# Patient Record
Sex: Male | Born: 1935 | Race: White | Hispanic: No | Smoking: Former smoker
Health system: Southern US, Community
[De-identification: ages and names within clinical notes are randomized; demographics above are authoritative.]

## PROBLEM LIST (undated history)

## (undated) DIAGNOSIS — H9190 Unspecified hearing loss, unspecified ear: Secondary | ICD-10-CM

## (undated) DIAGNOSIS — IMO0002 Reserved for concepts with insufficient information to code with codable children: Secondary | ICD-10-CM

## (undated) DIAGNOSIS — R413 Other amnesia: Secondary | ICD-10-CM

## (undated) DIAGNOSIS — K219 Gastro-esophageal reflux disease without esophagitis: Secondary | ICD-10-CM

## (undated) DIAGNOSIS — K5792 Diverticulitis of intestine, part unspecified, without perforation or abscess without bleeding: Secondary | ICD-10-CM

## (undated) DIAGNOSIS — E785 Hyperlipidemia, unspecified: Secondary | ICD-10-CM

## (undated) DIAGNOSIS — M1990 Unspecified osteoarthritis, unspecified site: Secondary | ICD-10-CM

## (undated) DIAGNOSIS — F429 Obsessive-compulsive disorder, unspecified: Secondary | ICD-10-CM

## (undated) DIAGNOSIS — R1310 Dysphagia, unspecified: Secondary | ICD-10-CM

## (undated) DIAGNOSIS — I10 Essential (primary) hypertension: Secondary | ICD-10-CM

## (undated) DIAGNOSIS — R569 Unspecified convulsions: Secondary | ICD-10-CM

## (undated) DIAGNOSIS — F419 Anxiety disorder, unspecified: Secondary | ICD-10-CM

## (undated) DIAGNOSIS — F1910 Other psychoactive substance abuse, uncomplicated: Secondary | ICD-10-CM

## (undated) DIAGNOSIS — G4762 Sleep related leg cramps: Secondary | ICD-10-CM

## (undated) DIAGNOSIS — T7840XA Allergy, unspecified, initial encounter: Secondary | ICD-10-CM

## (undated) DIAGNOSIS — H269 Unspecified cataract: Secondary | ICD-10-CM

## (undated) DIAGNOSIS — I447 Left bundle-branch block, unspecified: Secondary | ICD-10-CM

## (undated) HISTORY — DX: Essential (primary) hypertension: I10

## (undated) HISTORY — DX: Obsessive-compulsive disorder, unspecified: F42.9

## (undated) HISTORY — DX: Anxiety disorder, unspecified: F41.9

## (undated) HISTORY — DX: Unspecified cataract: H26.9

## (undated) HISTORY — DX: Dysphagia, unspecified: R13.10

## (undated) HISTORY — DX: Unspecified convulsions: R56.9

## (undated) HISTORY — DX: Gastro-esophageal reflux disease without esophagitis: K21.9

## (undated) HISTORY — DX: Unspecified hearing loss, unspecified ear: H91.90

## (undated) HISTORY — PX: BASAL CELL CARCINOMA EXCISION: SHX1214

## (undated) HISTORY — DX: Sleep related leg cramps: G47.62

## (undated) HISTORY — PX: TONSILLECTOMY: SUR1361

## (undated) HISTORY — DX: Reserved for concepts with insufficient information to code with codable children: IMO0002

## (undated) HISTORY — DX: Diverticulitis of intestine, part unspecified, without perforation or abscess without bleeding: K57.92

## (undated) HISTORY — DX: Other amnesia: R41.3

## (undated) HISTORY — DX: Unspecified osteoarthritis, unspecified site: M19.90

## (undated) HISTORY — PX: EYE SURGERY: SHX253

## (undated) HISTORY — DX: Other psychoactive substance abuse, uncomplicated: F19.10

## (undated) HISTORY — PX: TURP VAPORIZATION: SUR1397

## (undated) HISTORY — PX: CATARACT EXTRACTION: SUR2

## (undated) HISTORY — PX: PROSTATE SURGERY: SHX751

## (undated) HISTORY — DX: Hyperlipidemia, unspecified: E78.5

## (undated) HISTORY — DX: Allergy, unspecified, initial encounter: T78.40XA

## (undated) HISTORY — DX: Left bundle-branch block, unspecified: I44.7

---

## 2001-08-24 ENCOUNTER — Encounter: Payer: Self-pay | Admitting: Internal Medicine

## 2001-08-24 ENCOUNTER — Encounter: Admission: RE | Admit: 2001-08-24 | Discharge: 2001-08-24 | Payer: Self-pay | Admitting: Internal Medicine

## 2002-01-09 ENCOUNTER — Ambulatory Visit (HOSPITAL_COMMUNITY): Admission: RE | Admit: 2002-01-09 | Discharge: 2002-01-09 | Payer: Self-pay | Admitting: Cardiology

## 2004-08-25 ENCOUNTER — Inpatient Hospital Stay (HOSPITAL_COMMUNITY): Admission: RE | Admit: 2004-08-25 | Discharge: 2004-08-27 | Payer: Self-pay | Admitting: Urology

## 2004-08-25 ENCOUNTER — Encounter (INDEPENDENT_AMBULATORY_CARE_PROVIDER_SITE_OTHER): Payer: Self-pay | Admitting: *Deleted

## 2009-04-09 ENCOUNTER — Inpatient Hospital Stay (HOSPITAL_COMMUNITY): Admission: EM | Admit: 2009-04-09 | Discharge: 2009-04-10 | Payer: Self-pay | Admitting: Emergency Medicine

## 2010-04-07 ENCOUNTER — Encounter: Admission: RE | Admit: 2010-04-07 | Discharge: 2010-04-07 | Payer: Self-pay | Admitting: Internal Medicine

## 2010-08-19 ENCOUNTER — Emergency Department (HOSPITAL_COMMUNITY): Admission: EM | Admit: 2010-08-19 | Discharge: 2010-08-19 | Payer: Self-pay | Admitting: Emergency Medicine

## 2010-12-22 LAB — POCT CARDIAC MARKERS
CKMB, poc: <1 ng/mL — ABNORMAL LOW (ref 1.0–8.0)
Myoglobin, poc: 122 ng/mL (ref 12–200)
Troponin i, poc: <0.05 ng/mL (ref 0.00–0.09)

## 2010-12-22 LAB — POCT I-STAT, CHEM 8
BUN: 5 mg/dL — ABNORMAL LOW (ref 6–23)
Calcium, Ion: 1.13 mmol/L (ref 1.12–1.32)
Chloride: 100 mEq/L (ref 96–112)
Creatinine, Ser: 1.1 mg/dL (ref 0.4–1.5)
Glucose, Bld: 97 mg/dL (ref 70–99)
HCT: 43 % (ref 39.0–52.0)
Hemoglobin: 14.6 g/dL (ref 13.0–17.0)
Potassium: 4.3 mEq/L (ref 3.5–5.1)
Sodium: 137 mEq/L (ref 135–145)
TCO2: 28 mmol/L (ref 0–100)

## 2011-01-01 ENCOUNTER — Other Ambulatory Visit: Payer: Self-pay | Admitting: Family Medicine

## 2011-01-18 LAB — URINE CULTURE
Colony Count: NO GROWTH
Culture: NO GROWTH

## 2011-01-18 LAB — POCT CARDIAC MARKERS
CKMB, poc: <1 ng/mL — ABNORMAL LOW (ref 1.0–8.0)
Myoglobin, poc: 87.2 ng/mL (ref 12–200)

## 2011-01-18 LAB — CBC
MCV: 101.5 fL — ABNORMAL HIGH (ref 78.0–100.0)
Platelets: 223 10*3/uL (ref 150–400)
RDW: 13.5 % (ref 11.5–15.5)
WBC: 6.4 10*3/uL (ref 4.0–10.5)

## 2011-01-18 LAB — URINALYSIS, ROUTINE W REFLEX MICROSCOPIC
Glucose, UA: NEGATIVE mg/dL
Hgb urine dipstick: NEGATIVE
Specific Gravity, Urine: 1.011 (ref 1.005–1.030)
pH: 8 (ref 5.0–8.0)

## 2011-01-18 LAB — COMPREHENSIVE METABOLIC PANEL
AST: 31 U/L (ref 0–37)
Albumin: 3.7 g/dL (ref 3.5–5.2)
BUN: 8 mg/dL (ref 6–23)
Chloride: 106 mEq/L (ref 96–112)
Creatinine, Ser: 1.04 mg/dL (ref 0.4–1.5)
GFR calc Af Amer: >60 mL/min (ref >60)
Potassium: 3.9 mEq/L (ref 3.5–5.1)
Total Bilirubin: 0.6 mg/dL (ref 0.3–1.2)
Total Protein: 6.8 g/dL (ref 6.0–8.3)

## 2011-01-18 LAB — POCT I-STAT, CHEM 8
BUN: 9 mg/dL (ref 6–23)
Creatinine, Ser: 1.3 mg/dL (ref 0.4–1.5)
Potassium: 3.9 mEq/L (ref 3.5–5.1)
Sodium: 139 mEq/L (ref 135–145)

## 2011-01-18 LAB — DIFFERENTIAL
Basophils Absolute: 0.1 10*3/uL (ref 0.0–0.1)
Eosinophils Relative: 6 % — ABNORMAL HIGH (ref 0–5)
Lymphocytes Relative: 42 % (ref 12–46)
Monocytes Absolute: 0.6 10*3/uL (ref 0.1–1.0)
Monocytes Relative: 9 % (ref 3–12)
Neutro Abs: 2.7 10*3/uL (ref 1.7–7.7)

## 2011-02-01 ENCOUNTER — Other Ambulatory Visit: Payer: Self-pay | Admitting: Dermatology

## 2011-02-23 NOTE — Procedures (Signed)
EEG NUMBER:  [id number].   CLINICAL HISTORY:  An 77 year old man with memory problems, who awoke  this morning with jerking movements. EEG is for evaluation.  The  patient is described as awake and alert.  This is a portable EEG done in  the emergency department with photic stimulation, but without  hyperventilation.   DESCRIPTION:  The dominant rhythm is tracing, is a very well-modulated  medium amplitude alpha rhythm of 9-10 Hz, which predominates  posteriorly, appears without abnormal asymmetry, and attenuates with eye  opening and closing.  Low amplitude fast activity is seen frontally and  centrally and appears without abnormal asymmetry.  No focal slowing is  noted and no epileptiform discharges are seen.  The patient remained in  the awake state throughout the recording.  Photic stimulation produced  symmetric driver responses.  Hyperventilation was not performed.  Single  channel devoted EKG revealed sinus rhythm throughout with a rate of  approximately 60 beats minute.   CONCLUSION:  Normal study in awake state.      Michael L. Thad Ranger, M.D.  Electronically Signed     ZOX:WRUE  D:  04/09/2009 20:15:56  T:  04/10/2009 07:57:36  Job #:  [id number]

## 2011-02-23 NOTE — Consult Note (Signed)
Daniel [name], SABER                ACCOUNT NO.:  [id number]   MEDICAL RECORD NO.:  [id number]          PATIENT TYPE:  INP   LOCATION:  4710                         FACILITY:  [facility]   PHYSICIAN:  Casimiro Needle L. Reynolds, M.D.DATE OF BIRTH:  07/27/1921   DATE OF CONSULTATION:  04/09/2009  DATE OF DISCHARGE:                                 CONSULTATION   REQUESTING PHYSICIAN:  Hollice Espy, MD.   REASON FOR EVALUATION:  Jerking.   HISTORY OF PRESENT ILLNESS:  This is the initial inpatient consultation  and evaluation of this 77 year old man with a past medical history,  which includes dementia, on Aricept and Namenda.  He says he was in his  usual state of health recently.  This morning, he recalls waking up, as  if being a little bit agitated in a dream.  Immediately found awaken, or  perhaps shortly afterwards, he said he has a jerking of his head,  which was up and down.  It was not really painful, but he could not  control his head from going up and down.  It then moved to his stomach  and he says that he felt he was pulled forward with his legs being  pulled out.  After a lot of questioning, it is still not clear to me  whether this was twitching or jerking or whether it was more of a tonic  movement, which was intermittently relaxing sometimes.  He indicates, he  says it was particularly painful, but he could not control it.  He  called for his wife, who saw it.  He then settled down a little bit, and  he was well enough to jump out of that and run down the hall a few  minutes later.  Nevertheless, he was brought to the emergency  department.  The family at some point concerned about seizure, was  raised and he was admitted.  He had a CT of the head, which is further  discussed below.  He says he has never had any symptoms like this  before, but has had equal cramps easily on his arms and legs.  Again,  he was wide awake and alert during the event, able to call out to his  wife.   PAST MEDICAL HISTORY:  History of dementia as above.  History of  allergies.  Several other medical problems including hypertension,  hyperlipidemia, BPH, diverticulosis, gastroesophageal reflux disease,  PSVT, and precancerous skin lesions.   FAMILY HISTORY/SOCIAL HISTORY/REVIEW OF SYSTEMS:  Per admission H and P.   MEDICATIONS:  Prior to admission, he was taking  1. Aricept 5 mg daily.  2. Namenda 10 mg b.i.d.  3. Sanctura.  4. Pataday.  5. Nasonex.  6. Omeprazole.   PHYSICAL EXAMINATION:  VITAL SIGNS:  Temperature 97.4, blood pressure  113/56, pulse 58, respirations 20, and O2 sat 97% on room air.  GENERAL:  This is a healthy-appearing man, supine in the hospital bed,  in no distress.  HEENT:  Head, cranium is normocephalic and atraumatic.  Oropharynx is  benign.  NECK:  Supple without carotid or supraclavicular  bruits.  HEART:  Regular rate and rhythm without murmurs.  NEUROLOGIC:  Mental status, he is awake and alert.  He is oriented to  time and place.  Recent memory is mildly impaired, but remote is intact.  He is able to name objects, repeat phrase and follow commands without  difficulty.  Cranial nerves; pupils equal and briskly reactive.  Extraocular movements are full without nystagmus.  Visual fields full to  confrontation.  Hearing is intact to conversational speech.  Face,  tongue, and palate move normally and symmetrically.  Motor; normal bulk  and tone.  Normal strength in all tested extremity muscles.  No tremors  noted and no adventitious movements in the muscles of the trunk or  extremities.  Sensation is intact to pinprick and light touch  throughout.  He seems to have some hyperpathia over the feet.  Reflexes  diminished in lower extremities.  Toes are downgoing bilaterally.  Cerebellar, rapid movements performed accurately.  Finger-to-nose  performed accurately.  Gait; he rises easily from a chair.  His gait is  stable, slightly wide based, but he  can walk well, can easily toe walk,  sway slightly, but does not follow the Romberg maneuver.   LABORATORY REVIEW:  CMET on admission normal except for glucose of 105.  CBC on admission normal.  Cardiac enzymes negative, but unfortunate no  total CKs was done.  Urinalysis negative.  CT of the head was personally  reviewed.  The study is read out as showing prominent ventricles, but  from my review there is evident atrophy and any ventricular enlargement,  which is only minimally present, is not out of proportion to atrophy.  In my opinion, no evidence of hydrocephalus on the scan.  There was also  no acute finding.   IMPRESSION:  1. Involuntary jerking.  This was not a seizure as he was awake and      alert during the episode.  It is unclear what this represents, but      he says that he cramps easily.  It is possible this is side effect      of medication, although it is unclear exactly what.  2. Abnormal CT.  As above, I reviewed the films and feel that they      demonstrated only mild atrophy for age, with ventricular      enlargement not out of proportion to atrophy.  3. Question peripheral neuropathy.  He complains of cold feet and have      some hyperpathia with just some mild tenderness.   RECOMMENDATIONS:  We will continue medications, although if symptoms  recur, consider decreasing Aricept.  It will be wise to decrease the  patient's alcohol intake, which apparently is considerable.  Consider  outpatient NCV for his feet and if negative, lower extremity vascular  studies.  I would not start him on anticonvulsants.   Thank you for this consultation.      Michael L. Thad Ranger, M.D.  Electronically Signed     MLR/MEDQ  D:  04/09/2009  T:  04/10/2009  Job:  [id number]   cc:   Georgann Housekeeper, MD

## 2011-02-23 NOTE — Discharge Summary (Signed)
Daniel [name], Daniel [name]                ACCOUNT NO.:  [id number]   MEDICAL RECORD NO.:  [id number]          PATIENT TYPE:  INP   LOCATION:  4710                         FACILITY:  [facility]   PHYSICIAN:  Georgann Housekeeper, MD      DATE OF BIRTH:  [birth year]   DATE OF ADMISSION:  04/09/2009  DATE OF DISCHARGE:  04/10/2009                               DISCHARGE SUMMARY   DISCHARGE DIAGNOSES:  Possible questionable seizure with muscle cramps.  Neurology evaluation negative.  Abnormal CT imaging reviewed by  Neurology, did not think normal pressure hydrocephalus, medication  effect Sanctura, history of alcohol use, history of dementia, history of  hypertension, and history of urinary incontinence.   MEDICATION ON DISCHARGE:  1. Vitamin C 1 a day.  2. Aspirin 81 mg daily.  3. Multivitamin 1 daily.  4. Aricept 5 mg daily.  5. Omeprazole 20 mg daily.  6. Namenda 10 mg b.i.d.  7. Nasal spray p.r.n.  8. MiraLax 17 g p.r.n.   Discontinue Sanctura.   As far as hospital laboratory, CT scan negative.   LABORATORY DATA:  Urine, blood chemistries, blood counts normal.  Chest  x-ray negative.   HOSPITAL COURSE:  An 77 year old male who had an episode of questionable  confusion and also some muscle cramp, felt like jerking sensation.  The  patient was brought into the emergency room.  He had negative CT,  although the radiologist said it could be mildly enlarged ventricles for  his size.  The patient was evaluated by Neurology for possible seizure,  did not think there was any seizure activity nor the ventricle looked  normal for his age.  No evidence of any hydrocephalus.  The patient's  blood work remained all negative.  It was thought to be related to the  possible alcohol plus Sanctura.  No clear evidence of withdrawal.  The  patient was alert, oriented without any neurological deficits.  He was  monitored on telemetry with normal sinus rhythm and UA was negative.  As  far as his blood pressure,  it remained stable.  The patient will be  discharged.  His Philis Nettle will discontinue and encourage him to not to  drink alcohol and I will follow him in 3-4 weeks.  Possible nerve  conduction test as outpatient if indicated.      Georgann Housekeeper, MD  Electronically Signed     KH/MEDQ  D:  04/10/2009  T:  04/10/2009  Job:  [id number]

## 2011-02-23 NOTE — H&P (Signed)
NAMENEEL, BUFFONE                ACCOUNT NO.:  [id number]   MEDICAL RECORD NO.:  [id number]          PATIENT TYPE:  INP   LOCATION:  4710                         FACILITY:  [facility]   PHYSICIAN:  Hollice Espy, M.D.DATE OF BIRTH:  [birth year]   DATE OF ADMISSION:  04/09/2009  DATE OF DISCHARGE:                              HISTORY & PHYSICAL   CHIEF COMPLAINT:  Seizure.   HISTORY OF PRESENT ILLNESS:  Mr. Crumpacker is an 77 year-old male who awoke  today at 4 a.m. with rhythmic convulsions.  He said, my head was in  turmoil.  These compulsions were started in his head and neck and then  extended into his torso and extremities.  The daughter who lives next  door came to evaluate the patient.  She witnessed approximately 15  minutes of this activity, which he described as whole body rhythmic  jerking.  There was no loss of consciousness.  No bowel or bladder  incontinence, but she noted the patient was confused after the event.   ALLERGIES:  BIAXIN causes rash.  PENICILLIN G causes rash.  Also  multiple additional allergies including PEPPER, MUSTARD, and CINNAMON.   PAST MEDICAL HISTORY:  1. Hypercholesterolemia.  2. BPH.  3. Diverticulosis.  4. GERD.  5. Allergic rhinitis.  6. Paroxysmal SVT.  7. Dementia.  8. Hypertension.  9. Constipation.  10.Precancerous lesions, followed by Dermatology.   PAST SURGICAL HISTORY:  1. TURP.  2. Tonsillectomy.   SOCIAL HISTORY:  The patient is married.  He has 1 adopted grandson and  2 daughters.  He smoked from age 27 until 22.  He does drink alcohol.  The daughter reports currently drinking a fifth of alcohol.  She does  note that he drank a bottle's wine on father's day and that he sometimes  will have his vodka with breakfast.  He is a retired Airline pilot.   FAMILY HISTORY:  His mother is deceased at age 48 secondary to MI and  his dad is deceased at 61 with dementia.  He has 2 sisters who are  deceased secondary to MI and a brother  who is deceased secondary to MI.  He has a daughter who suffered an MI, however, she is living.   MEDICATIONS:  1. Vitamin C 500 mg p.o. daily.  2. Aspirin 81 mg p.o. daily.  3. Multivitamin 1 tab p.o. daily.  4. Aricept 5 mg p.o. nightly.  5. Omeprazole 20 mg p.o. daily.  6. Namenda 10 mg p.o. b.i.d.  7. Nasonex 50 mcg 2 puffs in each nostril daily.  8. Patanol 0.1% one drop into affected I twice daily.  9. MiraLax 17 g in 8 ounces of water.  10.Sanctura 60 mg p.o. daily.   REVIEW OF SYSTEMS:  GENERAL:  No fever, no chills.  ENT: Chronic nasal  congestion.  CARDIOVASCULAR:  Feet are always cold.  Denies chest pain  today.  RESPIRATORY:  No cough.  No chest congestion.  GI:  No nausea,  vomiting, or diarrhea.  PSYCHIATRIC:  Daughter reports positive anxiety  and he gets angry at times, sometimes punching  walls.  NEURO:  Hard-of-  hearing.  Please see HPI for additional neuro history.  The family also  reports memory decline in the last 6 months, especially short term  memory loss.  The patient complains of chronic cold feet.  MUSCULOSKELETAL:  Chronic joint pain, limp.  Denies lymphadenopathy.  SKIN:  Chronic eczema.   LABORATORY DATA/RADIOLOGY:  White blood cell count 6.4, hemoglobin 14.7,  hematocrit 44.1, and platelets 223.  BUN 8, creatinine 1.04, sodium 140,  potassium 3.9, AST 31, and ALT 22.  Myoglobin 87.2, MB less than 1,  troponin less than 0.05, INR 1.0.  Urinalysis is negative.  Culture is  pending.  Chest x-ray shows no active disease.  CT of head notes  prominent ventricles, questionably in proportion to atrophy, but could  not exclude normal pressure hydrocephalus.   PHYSICAL EXAMINATION:  VITAL SIGNS:  BP 122/68, heart rate 58,  respiratory rate 16, temperature 97, and O2 sat 99% on room air.  GENERAL:  Elderly white male in no acute distress.  NECK:  Supple.  No masses.  No thyroid enlargement.  ENT:  Ears with positive cerumen bilaterally.  TMs intact.   Positive  cone of light.  CARDIOVASCULAR:  S1 and S2.  Regular rate and rhythm.  No lower  extremity edema.  2+ dorsalis pedis and posterior tibial pulses  bilaterally.  RESPIRATORY:  Breath sounds are clear to auscultation bilaterally  without wheezes, rales, or rhonchi.  No increased work of breathing.  ABDOMEN:  Soft, nontender, and nondistended.  Positive bowel sounds.  NEURO:  Moving all extremities.  His speech is somewhat rambling.  Positive facial symmetry is noted.  Strong equal bilateral hand grabs.  The patient is hard-of-hearing.  SKIN:  Dry.  He has a scar in his left shoulder from former skin cancer  removal.   ASSESSMENT/PLAN:  1. Rule out seizure:  We will check an EEG, place the patient on      seizure precautions and alcohol withdrawal seizure is a      possibility, however, the patient's family cannot clearly define      his recent alcohol use.  We will add Ativan p.r.n. for withdrawal      or seizure.  We will also ask for Neurology consult and defer      further workup to their team in regards to abnormal findings on CT,      questioning normal pressure hydrocephalus.  2. Dementia:  This is progressive per the family especially in the      last 6 months.  We will hold Aricept and Namenda secondary to      seizure side effect.  This was reviewed in the [facility].  3. Hypercholesterolemia:  The patient refuses to take statin secondary      to I believe that this was making his memory worse.  4. Hypertension:  BP is stable.  No longer taking atenolol.  We will      monitor.  5. Urinary continence:  He is under a lot of extensive workup with      Urology which was negative.  Plan to continue Sanctura.  6. Diverticulosis, stable.  7. Gastroesophageal reflux disease:  Continue omeprazole.  8. Allergic rhinitis:  Stable.  Continue Patanol and Nasonex, and      outpatient followup with Dr. Stevphen Rochester.  9. History of supraventricular tachycardia:  The patient is  currently      with stable heart rate today.  I reviewed the EKG.  The patient is      currently in atrial fibrillation.  It is unclear whether or not he      has a history of atrial fibrillation or whether this is a new      finding.  I doubt that if he will be a Coumadin candidate secondary      to seizure and fall risk.  We will continue aspirin for now and      monitor the patient on telemetry.  10.History of skin cancer:  Outpatient followup with Dermatology.      Sandford Craze, NP      Hollice Espy, M.D.  Electronically Signed    MO/MEDQ  D:  04/09/2009  T:  04/09/2009  Job:  [id number]   cc:   Georgann Housekeeper, MD  Excell Seltzer. Annabell Howells, M.D.

## 2011-02-26 NOTE — Op Note (Signed)
Daniel [name], Daniel [name]                ACCOUNT NO.:  [id number]   MEDICAL RECORD NO.:  [id number]          PATIENT TYPE:  INP   LOCATION:  0005                         [facility]ACILITY[facility]  [facility]   PHYSICIAN:  Jamison Neighbor, M.D.  DATE OF BIRTH:  1921-06-20   DATE OF PROCEDURE:  08/25/2004  DATE OF DISCHARGE:                                 OPERATIVE REPORT   PREOPERATIVE DIAGNOSIS:  Benign prostatic hypertrophy with bladder outlet  obstruction.   POSTOPERATIVE DIAGNOSIS:  Benign prostatic hypertrophy with bladder outlet  obstruction.   PROCEDURE:  Cystoscopy and transurethral resection of the prostate.   SURGEON:  Jamison Neighbor, M.D.   ANESTHESIA:  General.   COMPLICATIONS:  None.   DRAINS:  A 24 French three-way Foley catheter.   HISTORY:  This 77 year old male has had problems with bladder outlet  obstruction.  He has had urgency, frequency, and nocturia up to three times  nightly.  His urinary stream is diminished.  Flomax has helped somewhat, but  the patient has a chronic case of nasal congestion that made it difficult to  take that medication.  He was offered the possibility of using a different  alpha blocker.  The patient was also given the opportunity to consider a  combination therapy with 5-alpha reductase inhibitors.  He has been placed  on an anticholinergic to cut down on some of his nocturia without that much  improvement.  He had requested other alternative treatments.  We discussed  in-office treatments such as TUNA or microwave as well as TURP.  He elected  to have the definitive procedure, namely the TURP.  He understands the risks  and benefits of the procedure.  He realizes he will have a hospital stay of  approximately two days.  He is also aware of the fact that he will have  hematuria, urgency, and frequency postoperatively, and the full affect of  the procedure on his bladder outlet will not be readily apparent for several  weeks or months.  Patient  understands the possible need for anticholinergic  therapy and realizes that the principle reason for this procedure is to  improve his ability to empty his bladder to completion.  He gave full,  informed consent.   PROCEDURE:  After successful induction of general anesthesia, the patient  was placed in the dorsal lithotomy position, prepped with Betadine, and  draped in the usual sterile fashion.  The urethra was calibrated to a 30  [place] with [employer].  The Olympus continuous resectoscope sheath  was then inserted, using a Timberlake obturator.  The __________  resectoscope was inserted with a 12 degree lens.  The bladder was carefully  inspected.  It was free of any tumors or stones. Both ureteral orifices were  normal in configuration and location and appropriately back from the bladder  neck.  There was no significant intravesical adenoma.  The patient had a  median level as well as classic lateral lobe hypertrophy.  The median level  was then resected, beginning at the bladder neck, extending out to the  verumontanum.  The right  lateral lobe was resected, starting at the 11  o'clock position and extending down to the floor of the prostate.  This went  from the bladder neck out to the level of the verumontanum.  The left  lateral lobe was resected in an identical fashion.  There was very little in  the way of anterior lobe tissue.  There was additional tissue on the floor  and apex that was resected.  Rectal examination at the end showed that most,  if not all of the residual tissue had been removed, and all that was really  left was a little bit out beyond the verumontanum, which was left in place  to avoid injury to the sphincter.  Careful visualization examination showed  that the bladder neck was opened but not undermined.  The ureters had not  been injured.  All chips had been irrigated from the bladder.  Adequate  hemostasis had been obtained.  There was a wide-open  prostatic fossa with an  intact sphincter and verumontanum.  The final inspection showed all chips  had been removed, and adequate hemostasis had been obtained.  The  resectoscope was removed.  A Foley catheter was inserted and drained  clearly.  The chips were all sent for permanent section.  The catheter was  placed to straight drainage, and continuous bladder irrigation was initiated  with normal saline.      RJE/MEDQ  D:  08/25/2004  T:  08/25/2004  Job:  [id number]   cc:   Georgann Housekeeper, MD  [address]  [place]  [place] [address]  Fax: [phone]   Armanda Magic, M.D.  [address]  [place], [place] [address]  Fax: [phone]

## 2011-02-26 NOTE — H&P (Signed)
[name], [name] NO.:  [id number]   MEDICAL RECORD NO.:  [id number]          PATIENT TYPE:  INP   LOCATION:  0005                         FACILITY:  [facility]   PHYSICIAN:  Jamison Neighbor, M.D.  DATE OF BIRTH:  09/02/1921   DATE OF ADMISSION:  08/25/2004  DATE OF DISCHARGE:                                HISTORY & PHYSICAL   HISTORY:  This 77 year old male has problems with BPH and bladder outlet  obstruction.  The patient has not responded as well as he would like to a  combination of alpha blockade and anticholinergic therapy.  Alpha blockers  have caused him serious problems with nasal congestion, and he does have  chronic allergies.  He is now to undergo TURP and will be admitted following  that procedure.   Patient's past medical history is quite unremarkable.  He has generally been  in very good health.  He is noted to have somewhat elevated lipids, and  occasionally has some irregular palpitations of the heart and tachycardia,  for which he takes atenolol.  This has gone on for approximately 30 years.  Dr. Donette [name] describes this as being supraventricular tachycardia.  A stress  test in 2000 was negative.  The patient's other medical problems primarily  involve his allergies, which he takes allergy shots and Nasonex as well as  Restasis eye drops and over-the-counter artificial tears.  As noted above,  he has used Enablex as well as Flomax to try and work on his problems with  his prostate.  He is also on Lipitor and Metamucil every day.  His Afrin was  discontinued.   Patient's previous surgeries are tonsillectomy as well as right cataract  surgery.   His review of systems is pertinent for the cardiac arrhythmia as well as the  allergy problems and his voiding problems.  He has mild problems with  arthritis and pain in the left knee as well as in the hips and ankles.  He  otherwise has an unremarkable review of systems.   SOCIAL HISTORY:   Unremarkable.  He does not use tobacco in over 40 years.  He drinks alcohol very infrequently.   PHYSICAL EXAMINATION:  HEENT:  Normocephalic and atraumatic.  NECK:  Supple with no adenopathy or thyromegaly.  LUNGS:  Clear.  HEART:  Regular rate and rhythm with no murmurs, thrills, gallops, rubs, or  heaves.  ABDOMEN:  Soft and nontender with no palpable masses, rebound or guarding.  Patient had no flank mass or tenderness.  GU:  Normal.  Testicles were unremarkable in size, shape, and consistency.  There was no hydrocele, spermatocele, varicocele or hernia.  No adenopathy.  RECTAL:  A 3+ benign-filling prostate.  EXTREMITIES:  No clubbing, cyanosis or edema.  NEUROLOGIC:  Cranial nerves II-XII were grossly intact.  VASCULAR:  Intact.   IMPRESSION:  Benign prostatic hypertrophy with bladder outlet obstruction.   SECONDARY DIAGNOSES:  1.  Arrhythmia.  2.  Chronic allergies.   PLAN:  Admit following TURP.      RJE/MEDQ  D:  08/25/2004  T:  08/25/2004  Job:  [id number] 

## 2011-11-03 DIAGNOSIS — J309 Allergic rhinitis, unspecified: Secondary | ICD-10-CM | POA: Diagnosis not present

## 2011-11-10 DIAGNOSIS — J309 Allergic rhinitis, unspecified: Secondary | ICD-10-CM | POA: Diagnosis not present

## 2011-11-17 DIAGNOSIS — J309 Allergic rhinitis, unspecified: Secondary | ICD-10-CM | POA: Diagnosis not present

## 2011-11-24 DIAGNOSIS — J309 Allergic rhinitis, unspecified: Secondary | ICD-10-CM | POA: Diagnosis not present

## 2011-12-01 DIAGNOSIS — J309 Allergic rhinitis, unspecified: Secondary | ICD-10-CM | POA: Diagnosis not present

## 2011-12-10 DIAGNOSIS — J309 Allergic rhinitis, unspecified: Secondary | ICD-10-CM | POA: Diagnosis not present

## 2011-12-16 DIAGNOSIS — J309 Allergic rhinitis, unspecified: Secondary | ICD-10-CM | POA: Diagnosis not present

## 2011-12-23 DIAGNOSIS — J309 Allergic rhinitis, unspecified: Secondary | ICD-10-CM | POA: Diagnosis not present

## 2011-12-29 DIAGNOSIS — J309 Allergic rhinitis, unspecified: Secondary | ICD-10-CM | POA: Diagnosis not present

## 2012-01-04 DIAGNOSIS — H1045 Other chronic allergic conjunctivitis: Secondary | ICD-10-CM | POA: Diagnosis not present

## 2012-01-04 DIAGNOSIS — J3089 Other allergic rhinitis: Secondary | ICD-10-CM | POA: Diagnosis not present

## 2012-01-04 DIAGNOSIS — J309 Allergic rhinitis, unspecified: Secondary | ICD-10-CM | POA: Diagnosis not present

## 2012-01-04 DIAGNOSIS — J301 Allergic rhinitis due to pollen: Secondary | ICD-10-CM | POA: Diagnosis not present

## 2012-01-18 DIAGNOSIS — J309 Allergic rhinitis, unspecified: Secondary | ICD-10-CM | POA: Diagnosis not present

## 2012-01-20 DIAGNOSIS — R3915 Urgency of urination: Secondary | ICD-10-CM | POA: Diagnosis not present

## 2012-01-20 DIAGNOSIS — N3941 Urge incontinence: Secondary | ICD-10-CM | POA: Diagnosis not present

## 2012-01-27 DIAGNOSIS — J309 Allergic rhinitis, unspecified: Secondary | ICD-10-CM | POA: Diagnosis not present

## 2012-02-02 DIAGNOSIS — J309 Allergic rhinitis, unspecified: Secondary | ICD-10-CM | POA: Diagnosis not present

## 2012-02-14 DIAGNOSIS — J309 Allergic rhinitis, unspecified: Secondary | ICD-10-CM | POA: Diagnosis not present

## 2012-02-23 DIAGNOSIS — J309 Allergic rhinitis, unspecified: Secondary | ICD-10-CM | POA: Diagnosis not present

## 2012-03-01 DIAGNOSIS — L821 Other seborrheic keratosis: Secondary | ICD-10-CM | POA: Diagnosis not present

## 2012-03-01 DIAGNOSIS — L570 Actinic keratosis: Secondary | ICD-10-CM | POA: Diagnosis not present

## 2012-03-01 DIAGNOSIS — J309 Allergic rhinitis, unspecified: Secondary | ICD-10-CM | POA: Diagnosis not present

## 2012-03-09 DIAGNOSIS — H353190 Nonexudative age-related macular degeneration, unspecified eye, stage unspecified: Secondary | ICD-10-CM | POA: Diagnosis not present

## 2012-03-09 DIAGNOSIS — J309 Allergic rhinitis, unspecified: Secondary | ICD-10-CM | POA: Diagnosis not present

## 2012-03-16 DIAGNOSIS — J309 Allergic rhinitis, unspecified: Secondary | ICD-10-CM | POA: Diagnosis not present

## 2012-03-22 DIAGNOSIS — J309 Allergic rhinitis, unspecified: Secondary | ICD-10-CM | POA: Diagnosis not present

## 2012-04-03 DIAGNOSIS — G309 Alzheimer's disease, unspecified: Secondary | ICD-10-CM | POA: Diagnosis not present

## 2012-04-03 DIAGNOSIS — F0280 Dementia in other diseases classified elsewhere without behavioral disturbance: Secondary | ICD-10-CM | POA: Diagnosis not present

## 2012-04-04 DIAGNOSIS — J309 Allergic rhinitis, unspecified: Secondary | ICD-10-CM | POA: Diagnosis not present

## 2012-04-05 DIAGNOSIS — J309 Allergic rhinitis, unspecified: Secondary | ICD-10-CM | POA: Diagnosis not present

## 2012-04-17 DIAGNOSIS — J309 Allergic rhinitis, unspecified: Secondary | ICD-10-CM | POA: Diagnosis not present

## 2012-04-18 ENCOUNTER — Ambulatory Visit (INDEPENDENT_AMBULATORY_CARE_PROVIDER_SITE_OTHER): Payer: Medicare Other | Admitting: Emergency Medicine

## 2012-04-18 ENCOUNTER — Encounter: Payer: Self-pay | Admitting: Emergency Medicine

## 2012-04-18 VITALS — BP 122/62 | HR 69 | Temp 98.1°F | Resp 16 | Ht 67.0 in | Wt 156.8 lb

## 2012-04-18 DIAGNOSIS — R109 Unspecified abdominal pain: Secondary | ICD-10-CM | POA: Diagnosis not present

## 2012-04-18 DIAGNOSIS — F0390 Unspecified dementia without behavioral disturbance: Secondary | ICD-10-CM | POA: Diagnosis not present

## 2012-04-18 DIAGNOSIS — R1084 Generalized abdominal pain: Secondary | ICD-10-CM | POA: Diagnosis not present

## 2012-04-18 DIAGNOSIS — R6881 Early satiety: Secondary | ICD-10-CM | POA: Diagnosis not present

## 2012-04-18 DIAGNOSIS — H6120 Impacted cerumen, unspecified ear: Secondary | ICD-10-CM | POA: Diagnosis not present

## 2012-04-18 LAB — CBC WITH DIFFERENTIAL/PLATELET
Basophils Absolute: 0.1 10*3/uL (ref 0.0–0.1)
Eosinophils Absolute: 0.3 10*3/uL (ref 0.0–0.7)
Lymphs Abs: 2.2 10*3/uL (ref 0.7–4.0)
MCH: 31.4 pg (ref 26.0–34.0)
Neutrophils Relative %: 52 % (ref 43–77)
Platelets: 242 10*3/uL (ref 150–400)
RBC: 4.37 MIL/uL (ref 4.22–5.81)
RDW: 14.6 % (ref 11.5–15.5)
WBC: 7 10*3/uL (ref 4.0–10.5)

## 2012-04-18 LAB — TSH: TSH: 0.987 u[IU]/mL (ref 0.350–4.500)

## 2012-04-18 LAB — COMPREHENSIVE METABOLIC PANEL
ALT: 13 U/L (ref 0–53)
AST: 21 U/L (ref 0–37)
Alkaline Phosphatase: 55 U/L (ref 39–117)
CO2: 28 mEq/L (ref 19–32)
Sodium: 138 mEq/L (ref 135–145)
Total Bilirubin: 0.4 mg/dL (ref 0.3–1.2)
Total Protein: 6.5 g/dL (ref 6.0–8.3)

## 2012-04-18 MED ORDER — OMEPRAZOLE 20 MG PO CPDR: 20.0000 mg | DELAYED_RELEASE_CAPSULE | Freq: Every day | ORAL | Status: DC

## 2012-04-19 NOTE — Progress Notes (Signed)
  Subjective:    Patient ID: Daniel [name], male    DOB: 12/28/1920, 78 y.o.   MRN: [id number]  HPI patient enters for general recheck and also would like removal of wax in his ears.    Review of Systems     Objective:   Physical Exam there is wax impaction involving both ears his chest is clear heart regular rate no murmur        Assessment & Plan:  Wax was removed patient tolerated this well. Patient stable on current medication regimen 2 no treatment necessary.

## 2012-04-21 DIAGNOSIS — J309 Allergic rhinitis, unspecified: Secondary | ICD-10-CM | POA: Diagnosis not present

## 2012-04-25 DIAGNOSIS — J309 Allergic rhinitis, unspecified: Secondary | ICD-10-CM | POA: Diagnosis not present

## 2012-04-26 ENCOUNTER — Ambulatory Visit
Admission: RE | Admit: 2012-04-26 | Discharge: 2012-04-26 | Disposition: A | Discharge status: Home / Self Care | Payer: Medicare Other | Source: Ambulatory Visit | Attending: Emergency Medicine | Admitting: Emergency Medicine

## 2012-04-26 DIAGNOSIS — R109 Unspecified abdominal pain: Secondary | ICD-10-CM

## 2012-04-26 DIAGNOSIS — R6881 Early satiety: Secondary | ICD-10-CM | POA: Diagnosis not present

## 2012-04-27 ENCOUNTER — Encounter: Payer: Self-pay | Admitting: Radiology

## 2012-04-28 DIAGNOSIS — J309 Allergic rhinitis, unspecified: Secondary | ICD-10-CM | POA: Diagnosis not present

## 2012-05-03 DIAGNOSIS — J309 Allergic rhinitis, unspecified: Secondary | ICD-10-CM | POA: Diagnosis not present

## 2012-05-10 DIAGNOSIS — J309 Allergic rhinitis, unspecified: Secondary | ICD-10-CM | POA: Diagnosis not present

## 2012-05-16 DIAGNOSIS — J309 Allergic rhinitis, unspecified: Secondary | ICD-10-CM | POA: Diagnosis not present

## 2012-05-19 DIAGNOSIS — J309 Allergic rhinitis, unspecified: Secondary | ICD-10-CM | POA: Diagnosis not present

## 2012-06-02 DIAGNOSIS — J309 Allergic rhinitis, unspecified: Secondary | ICD-10-CM | POA: Diagnosis not present

## 2012-06-15 DIAGNOSIS — J309 Allergic rhinitis, unspecified: Secondary | ICD-10-CM | POA: Diagnosis not present

## 2012-06-20 DIAGNOSIS — J309 Allergic rhinitis, unspecified: Secondary | ICD-10-CM | POA: Diagnosis not present

## 2012-06-26 DIAGNOSIS — H3530 Unspecified macular degeneration: Secondary | ICD-10-CM | POA: Diagnosis not present

## 2012-07-04 DIAGNOSIS — J309 Allergic rhinitis, unspecified: Secondary | ICD-10-CM | POA: Diagnosis not present

## 2012-07-17 DIAGNOSIS — J309 Allergic rhinitis, unspecified: Secondary | ICD-10-CM | POA: Diagnosis not present

## 2012-08-01 DIAGNOSIS — J309 Allergic rhinitis, unspecified: Secondary | ICD-10-CM | POA: Diagnosis not present

## 2012-08-15 ENCOUNTER — Ambulatory Visit (HOSPITAL_COMMUNITY)
Admission: RE | Admit: 2012-08-15 | Discharge: 2012-08-15 | Disposition: A | Discharge status: Home / Self Care | Payer: Medicare Other | Source: Ambulatory Visit | Attending: Neurology | Admitting: Neurology

## 2012-08-15 DIAGNOSIS — R1314 Dysphagia, pharyngoesophageal phase: Secondary | ICD-10-CM | POA: Insufficient documentation

## 2012-08-15 DIAGNOSIS — J309 Allergic rhinitis, unspecified: Secondary | ICD-10-CM | POA: Diagnosis not present

## 2012-08-15 DIAGNOSIS — R1310 Dysphagia, unspecified: Secondary | ICD-10-CM

## 2012-08-15 DIAGNOSIS — F0280 Dementia in other diseases classified elsewhere without behavioral disturbance: Secondary | ICD-10-CM | POA: Insufficient documentation

## 2012-08-15 DIAGNOSIS — K219 Gastro-esophageal reflux disease without esophagitis: Secondary | ICD-10-CM | POA: Diagnosis not present

## 2012-08-15 DIAGNOSIS — G309 Alzheimer's disease, unspecified: Secondary | ICD-10-CM | POA: Insufficient documentation

## 2012-08-15 NOTE — Procedures (Signed)
Objective Swallowing Evaluation: Modified Barium Swallowing Study  Patient Details  Name: LINFORD QUINTELA MRN: [id number] Date of Birth: 03-Jan-1921  Today's Date: 08/15/2012 Time: 1255-1400 SLP Time Calculation (min): 65 min  Past Medical History: No past medical history on file. Past Surgical History: No past surgical history on file. HPI:  78 yo male referred by Dr Anne Hahn for MBS due to pt complaint of problems coughing with liquids.  Pt has dealt with the loss of his wife in May and his brother in September of this year.  He resides in a condo next to his daughter.  Per interview with daughter, pt has h/o allergies for which he takes shots, reflux for which he takes omeprazole, Alzheimer's dementia, leg cramps.  medication list includes aricept, namenda, nasonex, asa, zoloft.  His daughter states pt has taken omeprazole for several years.  Pt is a poor historian due to his dementia, but is able to state he coughs on drinks when he raises his head.  Coughing does not occur with every meal but can be significant per pt and daughter.  Daughter states pt eats 1/2 of his meals over the last year and take the rest home from restaurant, stating he is full.  She acknowledges pt coughing on drinks at times with subsequent wet voice and hoarseness.  Pt consumes only two meals a day per daughter.  He also walks two miles a day, one mile in am and one in pm.  Daughter denies pt having pnas or weight loss.  Pt talks while he eats and eats a rapid rate per daughter.  He also takes several pills at one time with drink.  Pt denies sensation of food/drink coming back up but does point to throat to indicate area of sticking - points to right side.       Assessment / Plan / Recommendation Clinical Impression  Dysphagia Diagnosis: Mild oral phase dysphagia;Mild pharyngeal phase dysphagia;Mild cervical esophageal phase dysphagia (pt has known h/o GERD and takes omeprazole) Clinical impression: Pt with mild  oropharyngeal and cervical esophageal dysphagia without aspiration or any penetration of any consistency tested.  Please note, pt did overtly cough after first swallow of the study - which happened to be thin liquids.  Pt piecemeals all boluses and as oral residuals spill into pharynx and trigger at tip of epiglottis, pt was noted to talk but again with adequate airwy protection.  Laryngeal elevation/closure was complete.  Trace tongue base residuals noted without pt awareness but clearing with further swallows.  Barium tablet given with thin adhered to  tongue base and slp cued pt to expectorate it due to asp concerns.  Advised pt to take his medications with pudding whole - start and follow with liquids.    Educated pt and his daughter to possible progression of swallow deficits with progressive dementia and tips to mitigate dysphagia.  Pt may episocially aspirate due to his oral containment issues but SLP questions if cough is due to known reflux issues.  Daughter states pt belches "a lot".  Pt eats frequently alone per daughter and with memory impairment, he will be unable to folllow extensive compensatory strategies.      Treatment Recommendation       Diet Recommendation Dysphagia 3 (Mechanical Soft);Thin liquid;Regular (eat several small meals/day)   Medication Administration: Whole meds with puree (start with water, follow with water) Compensations: Slow rate;Small sips/bites;Follow solids with liquid Postural Changes and/or Swallow Maneuvers: Seated upright 90 degrees;Upright 30-60 min after meal  Other  Recommendations Oral Care Recommendations: Oral care before and after PO   Follow Up Recommendations  None    Frequency and Duration        Pertinent Vitals/Pain On room air, ambulates, appears comfortable    SLP Swallow Goals     General Date of Onset: 08/15/12 HPI: 78 yo male referred by Dr Anne Hahn for MBS due to pt complaint of problems coughing with liquids.  Pt has dealt  with the loss of his wife in May and his brother in September of this year.  He resides in a condo next to his daughter.  Per interview with daughter, pt has h/o allergies for which he takes shots, reflux for which he takes omeprazole, Alzheimer's dementia, leg cramps.  medication list includes aricept, namenda, nasonex, asa, zoloft.  His daughter states pt has taken omeprazole for several years.  Pt is a poor historian due to his dementia, but is able to state he coughs on drinks when he raises his head.  Coughing does not occur with every meal but can be significant per pt and daughter.  Daughter states pt eats 1/2 of his meals over the last year and take the rest home from restaurant, stating he is full.  She acknowledges pt coughing on drinks at times with subsequent wet voice and hoarseness.  Pt consumes only two meals a day per daughter.  He also walks two miles a day, one mile in am and one in pm.  Daughter denies pt having pnas or weight loss.  Pt talks while he eats and eats a rapid rate per daughter.  He also takes several pills at one time with drink.  Pt denies sensation of food/drink coming back up but does point to throat to indicate area of sticking - points to right side.   Type of Study: Modified Barium Swallowing Study Reason for Referral: Objectively evaluate swallowing function Diet Prior to this Study: Regular;Thin liquids Temperature Spikes Noted: No Respiratory Status: Room air History of Recent Intubation: No Behavior/Cognition: Alert;Cooperative;Hard of hearing Oral Cavity - Dentition: Dentures, bottom;Dentures, top (dentures since age 35) Oral Motor / Sensory Function: Within functional limits Self-Feeding Abilities: Able to feed self Baseline Vocal Quality: Clear Volitional Cough: Strong Volitional Swallow: Able to elicit Anatomy: Within functional limits Pharyngeal Secretions: Not observed secondary MBS    Reason for Referral Objectively evaluate swallowing function     Oral Phase Oral Preparation/Oral Phase Oral Phase: Impaired Oral - Nectar Oral - Nectar Cup: Piecemeal swallowing;Weak lingual manipulation;Reduced posterior propulsion;Delayed oral transit Oral - Thin Oral - Thin Teaspoon: Weak lingual manipulation;Delayed oral transit;Piecemeal swallowing Oral - Thin Cup: Piecemeal swallowing;Delayed oral transit;Reduced posterior propulsion;Weak lingual manipulation Oral - Thin Straw: Piecemeal swallowing;Delayed oral transit;Reduced posterior propulsion;Weak lingual manipulation Oral - Solids Oral - Puree: Piecemeal swallowing;Reduced posterior propulsion;Weak lingual manipulation Oral - Regular: Impaired mastication;Delayed oral transit;Piecemeal swallowing;Reduced posterior propulsion Oral - Pill: Weak lingual manipulation;Reduced posterior propulsion;Holding of bolus;Lingual/palatal residue;Delayed oral transit (pt did not orally transit, stuck to BOT-expectorated per SLP) Oral Phase - Comment Oral Phase - Comment: mild delay in oral transiting of all boluses, piecemeal deglutiton noted with all consistencies, tongue base residuals without pt full awareness  - delayed swallows cleared   Pharyngeal Phase Pharyngeal Phase Pharyngeal Phase: Impaired Pharyngeal Phase - Comment Pharyngeal Comment: pharyngeal swallow was timely with all initial swallows, delayed swallow of piecemealed boluses observed, no aspiration or penetration  Cervical Esophageal Phase    GO    Cervical Esophageal Phase Cervical Esophageal  Phase: Impaired Cervical Esophageal Phase - Nectar Nectar Cup: Prominent cricopharyngeal segment Cervical Esophageal Phase - Thin Thin Teaspoon: Prominent cricopharyngeal segment Thin Cup: Prominent cricopharyngeal segment Thin Straw: Prominent cricopharyngeal segment Cervical Esophageal Phase - Solids Puree: Prominent cricopharyngeal segment Regular: Prominent cricopharyngeal segment Cervical Esophageal Phase - Comment Cervical  Esophageal Comment: pt appeared with prominent cricopharyngeus but there was not pharyngeal residuals, appearance of min stasis just below UES with pudding - cleared with liquid swallows, appearance of mildly delayed clearance distallly of pudding that cleared with liquid swallow, radiologist not present to confirm    Functional Assessment Tool Used: MBS, clinical judgement Functional Limitations: Swallowing Swallow Current Status (Z6109): At least 1 percent but less than 20 percent impaired, limited or restricted Swallow Goal Status [phone]): At least 1 percent but less than 20 percent impaired, limited or restricted Swallow Discharge Status [phone]): At least 1 percent but less than 20 percent impaired, limited or restricted    Donavan Burnet, MS [facility] SLP [phone]

## 2012-08-28 DIAGNOSIS — J309 Allergic rhinitis, unspecified: Secondary | ICD-10-CM | POA: Diagnosis not present

## 2012-09-04 DIAGNOSIS — L570 Actinic keratosis: Secondary | ICD-10-CM | POA: Diagnosis not present

## 2012-09-04 DIAGNOSIS — Z85828 Personal history of other malignant neoplasm of skin: Secondary | ICD-10-CM | POA: Diagnosis not present

## 2012-09-04 DIAGNOSIS — L821 Other seborrheic keratosis: Secondary | ICD-10-CM | POA: Diagnosis not present

## 2012-09-12 DIAGNOSIS — J309 Allergic rhinitis, unspecified: Secondary | ICD-10-CM | POA: Diagnosis not present

## 2012-09-26 DIAGNOSIS — J309 Allergic rhinitis, unspecified: Secondary | ICD-10-CM | POA: Diagnosis not present

## 2012-10-09 DIAGNOSIS — J309 Allergic rhinitis, unspecified: Secondary | ICD-10-CM | POA: Diagnosis not present

## 2012-10-18 DIAGNOSIS — J309 Allergic rhinitis, unspecified: Secondary | ICD-10-CM | POA: Diagnosis not present

## 2012-10-26 DIAGNOSIS — J309 Allergic rhinitis, unspecified: Secondary | ICD-10-CM | POA: Diagnosis not present

## 2012-10-31 DIAGNOSIS — J309 Allergic rhinitis, unspecified: Secondary | ICD-10-CM | POA: Diagnosis not present

## 2012-11-07 DIAGNOSIS — J309 Allergic rhinitis, unspecified: Secondary | ICD-10-CM | POA: Diagnosis not present

## 2012-11-16 DIAGNOSIS — J309 Allergic rhinitis, unspecified: Secondary | ICD-10-CM | POA: Diagnosis not present

## 2012-12-06 DIAGNOSIS — J309 Allergic rhinitis, unspecified: Secondary | ICD-10-CM | POA: Diagnosis not present

## 2012-12-20 DIAGNOSIS — J309 Allergic rhinitis, unspecified: Secondary | ICD-10-CM | POA: Diagnosis not present

## 2012-12-28 DIAGNOSIS — H353190 Nonexudative age-related macular degeneration, unspecified eye, stage unspecified: Secondary | ICD-10-CM | POA: Diagnosis not present

## 2013-01-03 ENCOUNTER — Encounter: Payer: Self-pay | Admitting: Neurology

## 2013-01-03 ENCOUNTER — Ambulatory Visit (INDEPENDENT_AMBULATORY_CARE_PROVIDER_SITE_OTHER): Payer: Medicare Other | Admitting: Neurology

## 2013-01-03 VITALS — BP 128/67 | HR 63 | Ht 68.0 in | Wt 156.0 lb

## 2013-01-03 DIAGNOSIS — G309 Alzheimer's disease, unspecified: Secondary | ICD-10-CM | POA: Diagnosis not present

## 2013-01-03 DIAGNOSIS — F0280 Dementia in other diseases classified elsewhere without behavioral disturbance: Secondary | ICD-10-CM

## 2013-01-03 MED ORDER — SERTRALINE HCL 25 MG PO TABS: ORAL_TABLET | ORAL | Status: DC

## 2013-01-03 NOTE — Progress Notes (Signed)
   Reason for visit: Memory disorder  Daniel [name] is an 79 y.o. male  History of present illness:  Mr. Daniel [name] is a 79 year old right-handed white male with a history of a progressive memory disorder. The patient lives alone, and his family lives close to him. His daughter will come over and bring groceries, but the patient himself fixes his meals. The patient will walk on a regular basis. The daughter fixes the medications in a pill dispenser, and the patient takes his own medications. The patient has reduced the number of times that he takes a shower during the week, and he only does this twice a week at this point. The patient is having worsening problems with memory. The patient may put yogurt or left over food into the cabinet rather than the refrigerator. The patient is not wandering off or getting lost. The patient does not operate a motor vehicle at this time. The family helps him with the finances. The patient lost his wife in May of 2013. The patient does not consistently recognize his granddaughter. The patient appears to be more anxious and possibly depressed. The patient is not eating well, but he is not losing weight. The patient may have occasional nightmares at night. The patient is becoming more withdrawn, and he is more anxious and possibly depressed.   ROS:  Out of a complete 14 system review of symptoms, the patient complains only of the following symptoms, and all other reviewed systems are negative.  Hearing loss  Rash Itching Easy bruising Feeling cold Joint pain, joint swelling, cramps Urinary incontinence Allergies, runny nose, skin sensitivity Memory loss, confusion Weakness Anxiety Decreased appetite Disinterest in activities  Blood pressure 128/67, pulse 63, height 5\' 8"  (1.727 m), weight 156 lb (70.761 kg).  Physical Exam  General: The patient is alert and cooperative at the time of the examination.  Skin: No significant peripheral edema is  noted.   Neurologic Exam  Mental status: The Mini-Mental status examination done today shows a total score 21/30. The patient is able to name 5 animals in 60 seconds.  Cranial nerves: Facial symmetry is present. Speech is normal, no aphasia or dysarthria is noted. Extraocular movements are full. Visual fields are full.  Motor: The patient has good strength in all 4 extremities.  Coordination: The patient has good finger-nose-finger and heel-to-shin bilaterally. Some apraxia is noted.  Gait and station: The patient has a normal gait. Tandem gait is steady, but the patient is apraxic. Romberg is negative. No drift is seen.  Reflexes: Deep tendon reflexes are symmetric.    Assessment/Plan:  One. Memory disturbance  2. Anxiety and depression  The patient will be increased on the Zoloft, as he may be becoming more anxious and depressed. The patient is having increasing problems with withdrawal, and decreased verbal output and he is developing some word finding problems. The changes in personality may be related to the dementia. The patient is on Aricept and Namenda. The patient will followup through this office in 6-8 months. Over time, the patient may need more supervision.  Marlan Palau MD 01/03/2013 2:52 PM

## 2013-01-03 NOTE — Patient Instructions (Signed)
   We will go up on the zoloft taking one (25 mg) in the morning and two in the evening.

## 2013-01-04 DIAGNOSIS — J301 Allergic rhinitis due to pollen: Secondary | ICD-10-CM | POA: Diagnosis not present

## 2013-01-04 DIAGNOSIS — R21 Rash and other nonspecific skin eruption: Secondary | ICD-10-CM | POA: Diagnosis not present

## 2013-01-04 DIAGNOSIS — J309 Allergic rhinitis, unspecified: Secondary | ICD-10-CM | POA: Diagnosis not present

## 2013-01-04 DIAGNOSIS — H1045 Other chronic allergic conjunctivitis: Secondary | ICD-10-CM | POA: Diagnosis not present

## 2013-01-04 DIAGNOSIS — J3089 Other allergic rhinitis: Secondary | ICD-10-CM | POA: Diagnosis not present

## 2013-01-12 DIAGNOSIS — J309 Allergic rhinitis, unspecified: Secondary | ICD-10-CM | POA: Diagnosis not present

## 2013-01-22 DIAGNOSIS — R82998 Other abnormal findings in urine: Secondary | ICD-10-CM | POA: Diagnosis not present

## 2013-01-22 DIAGNOSIS — N3941 Urge incontinence: Secondary | ICD-10-CM | POA: Diagnosis not present

## 2013-01-22 DIAGNOSIS — N401 Enlarged prostate with lower urinary tract symptoms: Secondary | ICD-10-CM | POA: Diagnosis not present

## 2013-01-24 ENCOUNTER — Ambulatory Visit (INDEPENDENT_AMBULATORY_CARE_PROVIDER_SITE_OTHER): Payer: Medicare Other | Admitting: Internal Medicine

## 2013-01-24 ENCOUNTER — Ambulatory Visit: Payer: Medicare Other

## 2013-01-24 ENCOUNTER — Encounter: Payer: Self-pay | Admitting: Internal Medicine

## 2013-01-24 VITALS — BP 100/50 | HR 58 | Temp 98.0°F | Resp 16 | Ht 68.0 in | Wt 150.0 lb

## 2013-01-24 DIAGNOSIS — M25552 Pain in left hip: Secondary | ICD-10-CM

## 2013-01-24 DIAGNOSIS — Z0000 Encounter for general adult medical examination without abnormal findings: Secondary | ICD-10-CM | POA: Diagnosis not present

## 2013-01-24 DIAGNOSIS — M6281 Muscle weakness (generalized): Secondary | ICD-10-CM

## 2013-01-24 DIAGNOSIS — I10 Essential (primary) hypertension: Secondary | ICD-10-CM

## 2013-01-24 DIAGNOSIS — R8281 Pyuria: Secondary | ICD-10-CM

## 2013-01-24 DIAGNOSIS — K219 Gastro-esophageal reflux disease without esophagitis: Secondary | ICD-10-CM | POA: Insufficient documentation

## 2013-01-24 DIAGNOSIS — K5730 Diverticulosis of large intestine without perforation or abscess without bleeding: Secondary | ICD-10-CM | POA: Insufficient documentation

## 2013-01-24 DIAGNOSIS — D649 Anemia, unspecified: Secondary | ICD-10-CM

## 2013-01-24 DIAGNOSIS — R82998 Other abnormal findings in urine: Secondary | ICD-10-CM

## 2013-01-24 DIAGNOSIS — F0390 Unspecified dementia without behavioral disturbance: Secondary | ICD-10-CM

## 2013-01-24 DIAGNOSIS — M25559 Pain in unspecified hip: Secondary | ICD-10-CM | POA: Diagnosis not present

## 2013-01-24 DIAGNOSIS — N400 Enlarged prostate without lower urinary tract symptoms: Secondary | ICD-10-CM

## 2013-01-24 DIAGNOSIS — F05 Delirium due to known physiological condition: Secondary | ICD-10-CM

## 2013-01-24 DIAGNOSIS — R21 Rash and other nonspecific skin eruption: Secondary | ICD-10-CM

## 2013-01-24 DIAGNOSIS — E785 Hyperlipidemia, unspecified: Secondary | ICD-10-CM | POA: Insufficient documentation

## 2013-01-24 LAB — COMPREHENSIVE METABOLIC PANEL
Albumin: 4.4 g/dL (ref 3.5–5.2)
BUN: 12 mg/dL (ref 6–23)
CO2: 28 mEq/L (ref 19–32)
Calcium: 9.3 mg/dL (ref 8.4–10.5)
Chloride: 102 mEq/L (ref 96–112)
Creat: 1.18 mg/dL (ref 0.50–1.35)
Glucose, Bld: 99 mg/dL (ref 70–99)
Potassium: 4.3 mEq/L (ref 3.5–5.3)

## 2013-01-24 LAB — POCT URINALYSIS DIPSTICK
Bilirubin, UA: NEGATIVE
Glucose, UA: NEGATIVE
Ketones, UA: NEGATIVE
Leukocytes, UA: NEGATIVE
Nitrite, UA: NEGATIVE

## 2013-01-24 LAB — POCT UA - MICROSCOPIC ONLY: Yeast, UA: NEGATIVE

## 2013-01-24 LAB — CBC
HCT: 42.4 % (ref 39.0–52.0)
Hemoglobin: 14.4 g/dL (ref 13.0–17.0)
MCH: 32.7 pg (ref 26.0–34.0)
MCHC: 34 g/dL (ref 30.0–36.0)
MCV: 96.1 fL (ref 78.0–100.0)
RBC: 4.41 MIL/uL (ref 4.22–5.81)

## 2013-01-24 LAB — TSH: TSH: 1.263 u[IU]/mL (ref 0.350–4.500)

## 2013-01-24 LAB — CK: Total CK: 55 U/L (ref 7–232)

## 2013-01-24 NOTE — Progress Notes (Addendum)
Subjective:    Patient ID: Daniel [name], male    DOB: April 23, 1921, 79 y.o.   MRN: [id number]  HPI89 yo followed here w/ dr Cleta Alberts and I and at [facility] senior care at times with multiple specialists=Dr Anne Hahn for dementia, dr Yetta Barre for derm,dr Annabell Howells for Kat[facility] allergy,[facility] Patient Active Problem List  Diagnosis  . Alzheimer's disease  . BPH (benign prostatic hyperplasia)  . GERD (gastroesophageal reflux disease)  . HTN (hypertension)  . Other and unspecified hyperlipidemia  . Diverticular disease of colon   Now daughter his caretaker although he lives in his own home still-she reflects greater stress due to his deteriorating mental condition Has complained of recent increasing itching on arms and neck and scalp Has seen Dr. Yetta Barre with various therapies  Urgency frequency and nocturia had been attempted treatment per urology Gala Murdoch stopped last week ? Influence on recent progression of CNS sxtoms-has been more confused over last 2 weeks Pyuria at urol last week-cult??  Daughter has noticed a dramatic problem with getting up at her chairs and initial gait which sayings antalgic and unstable/there've been no falls/he does not complain of numbness in extremities/his knees have been examined and all normal/he indicates some degree of hip pain/no known injury   He is against moving to a nursing facility/his daughter is here with him and is counseled about her role in caregiving and the necessity for support services Home safety screen is negative Risk for falls is average or below Beck depression scale is negative  Review of Systems  Constitutional: Positive for appetite change.  HENT: Positive for hearing loss and postnasal drip.   Eyes: Positive for redness and itching.  Respiratory: Negative.   Cardiovascular: Negative.   Gastrointestinal: Negative.   Endocrine: Negative.   Genitourinary: Positive for urgency, frequency and hematuria.  Musculoskeletal: Positive for  joint swelling and arthralgias.  Skin: Positive for rash.  Allergic/Immunologic: Negative.   Neurological: Positive for speech difficulty.  Hematological: Bruises/bleeds easily.  Psychiatric/Behavioral: Positive for confusion, decreased concentration and agitation. The patient is nervous/anxious.    he sleeps well except for nocturia x2 He complains of some obstruction to swallowing on the right side intermittently/evaluation by ENT and GI have been negative to date/this is a variable symptom/he does have postnasal drainage with allergies/he does have a history of reflux    Objective:   Physical Exam BP 100/50  Pulse 58  Temp(Src) 98 F (36.7 C) (Oral)  Resp 16  Ht 5\' 8"  (1.727 m)  Wt 150 lb (68.04 kg)  BMI 22.81 kg/m2  SpO2 97% No general distress HEENT clear Heart regular without murmur/rate 58/no carotid bruits Lungs clear Abdomen benign without organic megaly masses or bruits Spine is straight Straight leg raise within normal limits Extremities without edema/good peripheral pulses//hands exhibit changes at the PIP and DIP consistent with osteoarthritis with decreased range of motion He exhibits weakness getting up out of chair No muscle atrophy Scan with Dry changes over arms and hands/lesions do not have discrete borders/sun related changes over arms and face with no hyperpigmented lesions/scalp is clear Neurologic-cranial nerves II through XII intact Cerebellar intact Deep tendon reflexes symmetrical No obvious motor or sensory losses Mood is good His judgment lacks consistent answers/has no concept of his level of dementia/believes he can care for himself in terms of cooking and eating Memory lacks some short-term answers     [facility] reading (PRIMARY) by  Dr. Vasilisa Vore=NAD-left hip normal  Results for orders placed in visit on 01/24/13  CBC      Result Value Range   WBC 5.2  4.0 - 10.5 K/uL   RBC 4.41  4.22 - 5.81 MIL/uL   Hemoglobin 14.4  13.0 - 17.0 g/dL   HCT  16.1  09.6 - 04.5 %   MCV 96.1  78.0 - 100.0 fL   MCH 32.7  26.0 - 34.0 pg   MCHC 34.0  30.0 - 36.0 g/dL   RDW 40.9  81.1 - 91.4 %   Platelets 239  150 - 400 K/uL  SEDIMENTATION RATE      Result Value Range   Sed Rate 6  0 - 16 mm/hr  VITAMIN B12      Result Value Range   Vitamin B-12 638  211 - 911 pg/mL  COMPREHENSIVE METABOLIC PANEL      Result Value Range   Sodium 139  135 - 145 mEq/L   Potassium 4.3  3.5 - 5.3 mEq/L   Chloride 102  96 - 112 mEq/L   CO2 28  19 - 32 mEq/L   Glucose, Bld 99  70 - 99 mg/dL   BUN 12  6 - 23 mg/dL   Creat 7.82  9.56 - 2.13 mg/dL   Total Bilirubin 0.6  0.3 - 1.2 mg/dL   Alkaline Phosphatase 67  39 - 117 U/L   AST 26  0 - 37 U/L   ALT 18  0 - 53 U/L   Total Protein 6.7  6.0 - 8.3 g/dL   Albumin 4.4  3.5 - 5.2 g/dL   Calcium 9.3  8.4 - 08.6 mg/dL  TSH      Result Value Range   TSH 1.263  0.350 - 4.500 uIU/mL  CK      Result Value Range   Total CK 55  7 - 232 U/L  POCT UA - MICROSCOPIC ONLY      Result Value Range   WBC, Ur, HPF, POC 1-3     RBC, urine, microscopic 2-5     Bacteria, U Microscopic 1+     Mucus, UA trace     Epithelial cells, urine per micros 0-1     Crystals, Ur, HPF, POC neg     Casts, Ur, LPF, POC neg     Yeast, UA neg    POCT URINALYSIS DIPSTICK      Result Value Range   Color, UA yellow     Clarity, UA clear     Glucose, UA neg     Bilirubin, UA neg     Ketones, UA neg     Spec Grav, UA 1.010     Blood, UA trace     pH, UA 6.0     Protein, UA neg     Urobilinogen, UA 0.2     Nitrite, UA neg     Leukocytes, UA Negative      Assessment & Plan:  Physical examination Medicare annual wellness visit, subsequent - Plan: CBC, Sedimentation Rate, Vitamin B12, Comprehensive metabolic panel, TSH, CK, POCT UA - Microscopic Only, POCT urinalysis dipstick  Left hip pain - Plan: DG Hip Complete Left, Vitamin B12//need to rule out muscular disorder due to weakness when arising from chair  Acute confusional state -  Plan: Vitamin B12/uncertain etiology for recent changes  Dementia - Plan: Vitamin B12/followup neurology//uncertain etiology for exacerbation of symptoms  Pyuria-by history but today's urinalysis acceptable  Muscle weakness - Plan: CBC, Vitamin B12, Comprehensive metabolic panel, TSH, CK  Rash and nonspecific skin  eruption - Plan: Vitamin B12, Comprehensive metabolic panel  Anemia - Plan: Vitamin B12//recheck hemoglobin  BPH (benign prostatic hyperplasia)-follow up with urology  GERD (gastroesophageal reflux disease)-continue medication  HTN (hypertension)--this diagnosis is possibly resolved  Other and unspecified hyperlipidemia--no need for treatment  Diverticular disease of colon-inactive at this point  Osteoarthritis hands--hot water with range of motion   call for refills as needed   55 minute office visit

## 2013-01-24 NOTE — Progress Notes (Deleted)
  Subjective:    Patient ID: Daniel [name], male    DOB: 08/23/1921, 79 y.o.   MRN: [id number]  HPI    Review of Systems     Objective:   Physical Exam       [facility] reading (PRIMARY) by  Dr. [name]=NAD   = Assessment & Plan:

## 2013-01-29 ENCOUNTER — Telehealth: Payer: Self-pay

## 2013-01-29 ENCOUNTER — Encounter: Payer: Self-pay | Admitting: Internal Medicine

## 2013-01-29 DIAGNOSIS — M6281 Muscle weakness (generalized): Secondary | ICD-10-CM

## 2013-01-29 NOTE — Telephone Encounter (Signed)
Daniel [name] STATES WE HAD CALLED REGARDING HER DAD'S LABS SHE THINK. PLEASE CALL [phone]

## 2013-01-29 NOTE — Telephone Encounter (Signed)
Recorded by Tonye Pearson, MD on 01/29/2013 at 9:03 AM Call daughter [name] Labs all normal-no evidence for muscle or thyroid disease to explain his trouble getting up. Please advised what else we can do, he has a very difficult time changing positions, esp from seated to standing, advised her we may be able to get him a seat lift mechanism, pended, can we try this? Can fax to [facility] if you want him to try this. Daniel [name]

## 2013-01-31 DIAGNOSIS — J309 Allergic rhinitis, unspecified: Secondary | ICD-10-CM | POA: Diagnosis not present

## 2013-02-01 DIAGNOSIS — J309 Allergic rhinitis, unspecified: Secondary | ICD-10-CM | POA: Diagnosis not present

## 2013-02-01 NOTE — Telephone Encounter (Signed)
We need ortho eval 1st to decide if PT is a better way to go

## 2013-02-02 NOTE — Telephone Encounter (Signed)
Called her, and she is currently in the hospital, will let me know when she is out, she can take care of this. She is to have a cardiac cath. Done, she was taken emergently to [facility] hospital, is inpatient now. Has ischemia and states she will be in touch, hospital records should be coming to you. Amy

## 2013-02-16 DIAGNOSIS — J309 Allergic rhinitis, unspecified: Secondary | ICD-10-CM | POA: Diagnosis not present

## 2013-02-21 DIAGNOSIS — R3915 Urgency of urination: Secondary | ICD-10-CM | POA: Diagnosis not present

## 2013-02-26 ENCOUNTER — Ambulatory Visit: Payer: Medicare Other

## 2013-02-26 ENCOUNTER — Ambulatory Visit (INDEPENDENT_AMBULATORY_CARE_PROVIDER_SITE_OTHER): Payer: Medicare Other | Admitting: Family Medicine

## 2013-02-26 VITALS — BP 128/54 | HR 51 | Temp 97.8°F | Resp 16 | Ht 68.0 in | Wt 153.0 lb

## 2013-02-26 DIAGNOSIS — R1032 Left lower quadrant pain: Secondary | ICD-10-CM

## 2013-02-26 DIAGNOSIS — J309 Allergic rhinitis, unspecified: Secondary | ICD-10-CM | POA: Diagnosis not present

## 2013-02-26 DIAGNOSIS — M169 Osteoarthritis of hip, unspecified: Secondary | ICD-10-CM | POA: Diagnosis not present

## 2013-02-26 DIAGNOSIS — K5732 Diverticulitis of large intestine without perforation or abscess without bleeding: Secondary | ICD-10-CM

## 2013-02-26 LAB — POCT CBC
Granulocyte percent: 51 %G (ref 37–80)
HCT, POC: 44.7 % (ref 43.5–53.7)
Hemoglobin: 14 g/dL — AB (ref 14.1–18.1)
Lymph, poc: 2.5 (ref 0.6–3.4)
MCHC: 31.3 g/dL — AB (ref 31.8–35.4)
MCV: 104.1 fL — AB (ref 80–97)
POC Granulocyte: 3.3 (ref 2–6.9)
POC LYMPH PERCENT: 38.6 %L (ref 10–50)
RDW, POC: 14.2 %

## 2013-02-26 LAB — POCT URINALYSIS DIPSTICK
Glucose, UA: NEGATIVE
Leukocytes, UA: NEGATIVE
Nitrite, UA: NEGATIVE
Protein, UA: NEGATIVE
Urobilinogen, UA: 0.2

## 2013-02-26 LAB — COMPREHENSIVE METABOLIC PANEL
Albumin: 4.4 g/dL (ref 3.5–5.2)
Alkaline Phosphatase: 68 U/L (ref 39–117)
BUN: 13 mg/dL (ref 6–23)
CO2: 30 mEq/L (ref 19–32)
Glucose, Bld: 92 mg/dL (ref 70–99)
Potassium: 4.6 mEq/L (ref 3.5–5.3)
Sodium: 138 mEq/L (ref 135–145)
Total Protein: 6.9 g/dL (ref 6.0–8.3)

## 2013-02-26 LAB — IFOBT (OCCULT BLOOD): IFOBT: NEGATIVE

## 2013-02-26 MED ORDER — LEVOFLOXACIN 500 MG PO TABS: 500.0000 mg | ORAL_TABLET | Freq: Every day | ORAL | Status: DC

## 2013-02-26 MED ORDER — METRONIDAZOLE 250 MG PO TABS: 250.0000 mg | ORAL_TABLET | Freq: Three times a day (TID) | ORAL | Status: DC

## 2013-02-26 NOTE — Patient Instructions (Signed)
Diverticulitis °A diverticulum is a small pouch or sac on the colon. Diverticulosis is the presence of these diverticula on the colon. Diverticulitis is the irritation (inflammation) or infection of diverticula. °CAUSES  °The colon and its diverticula contain bacteria. If food particles block the tiny opening to a diverticulum, the bacteria inside can grow and cause an increase in pressure. This leads to infection and inflammation and is called diverticulitis. °SYMPTOMS  °· Abdominal pain and tenderness. Usually, the pain is located on the left side of your abdomen. However, it could be located elsewhere. °· Fever. °· Bloating. °· Feeling sick to your stomach (nausea). °· Throwing up (vomiting). °· Abnormal stools. °DIAGNOSIS  °Your caregiver will take a history and perform a physical exam. Since many things can cause abdominal pain, other tests may be necessary. Tests may include: °· Blood tests. °· Urine tests. °· X-ray of the abdomen. °· CT scan of the abdomen. °Sometimes, surgery is needed to determine if diverticulitis or other conditions are causing your symptoms. °TREATMENT  °Most of the time, you can be treated without surgery. Treatment includes: °· Resting the bowels by only having liquids for a few days. As you improve, you will need to eat a low-fiber diet. °· Intravenous (IV) fluids if you are losing body fluids (dehydrated). °· Antibiotic medicines that treat infections may be given. °· Pain and nausea medicine, if needed. °· Surgery if the inflamed diverticulum has burst. °HOME CARE INSTRUCTIONS  °· Try a clear liquid diet (broth, tea, or water for as long as directed by your caregiver). You may then gradually begin a low-fiber diet as tolerated. A low-fiber diet is a diet with less than 10 grams of fiber. Choose the foods below to reduce fiber in the diet: °· White breads, cereals, rice, and pasta. °· Cooked fruits and vegetables or soft fresh fruits and vegetables without the skin. °· Ground or  well-cooked tender beef, ham, veal, lamb, pork, or poultry. °· Eggs and seafood. °· After your diverticulitis symptoms have improved, your caregiver may put you on a high-fiber diet. A high-fiber diet includes 14 grams of fiber for every 1000 calories consumed. For a standard 2000 calorie diet, you would need 28 grams of fiber. Follow these diet guidelines to help you increase the fiber in your diet. It is important to slowly increase the amount fiber in your diet to avoid gas, constipation, and bloating. °· Choose whole-grain breads, cereals, pasta, and brown rice. °· Choose fresh fruits and vegetables with the skin on. Do not overcook vegetables because the more vegetables are cooked, the more fiber is lost. °· Choose more nuts, seeds, legumes, dried peas, beans, and lentils. °· Look for food products that have greater than 3 grams of fiber per serving on the Nutrition Facts label. °· Take all medicine as directed by your caregiver. °· If your caregiver has given you a follow-up appointment, it is very important that you go. Not going could result in lasting (chronic) or permanent injury, pain, and disability. If there is any problem keeping the appointment, call to reschedule. °SEEK MEDICAL CARE IF:  °· Your pain does not improve. °· You have a hard time advancing your diet beyond clear liquids. °· Your bowel movements do not return to normal. °SEEK IMMEDIATE MEDICAL CARE IF:  °· Your pain becomes worse. °· You have an oral temperature above 102° F (38.9° C), not controlled by medicine. °· You have repeated vomiting. °· You have bloody or black, tarry stools. °· Symptoms   that brought you to your caregiver become worse or are not getting better. °MAKE SURE YOU:  °· Understand these instructions. °· Will watch your condition. °· Will get help right away if you are not doing well or get worse. °Document Released: 07/07/2005 Document Revised: 12/20/2011 Document Reviewed: 11/02/2010 °[employer]® Patient Information  ©2013 [employer]. ° °

## 2013-02-26 NOTE — Progress Notes (Signed)
Subjective: 79 year old man who's been having several days of low abdominal pain. This worse. He is point tender numbness in the left lower quadrant no nausea or vomiting. He walks about 2 miles every day. He lives next door to his daughter checks on him regularly even though he has a little confusion.  Bowels act regularly. No blood in the stool urine.  Objective: Elderly gentleman who is pretty alert today. His daughter is with him. Throat clear. Neck supple without nodes. Chest clear. Heart regular without murmurs hurt himself. Tender to percussion and palpation in the left lower quadrant. It is a fairly specific point tenderness. Digital exam was done prostate essentially normal. There is stool present which has no blood in it. The stool was negative for focal blood. Urinalysis had trace of blood in it, but his urologist told he had that last week.   Results for orders placed in visit on 02/26/13  IFOBT (OCCULT BLOOD)      Result Value Range   IFOBT Negative    POCT CBC      Result Value Range   WBC 6.4  4.6 - 10.2 K/uL   Lymph, poc 2.5  0.6 - 3.4   POC LYMPH PERCENT 38.6  10 - 50 %L   MID (cbc) 0.7  0 - 0.9   POC MID % 10.4  0 - 12 %M   POC Granulocyte 3.3  2 - 6.9   Granulocyte percent 51.0  37 - 80 %G   RBC 4.29 (*) 4.69 - 6.13 M/uL   Hemoglobin 14.0 (*) 14.1 - 18.1 g/dL   HCT, POC 40.9  81.1 - 53.7 %   MCV 104.1 (*) 80 - 97 fL   MCH, POC 32.6 (*) 27 - 31.2 pg   MCHC 31.3 (*) 31.8 - 35.4 g/dL   RDW, POC 91.4     Platelet Count, POC 219  142 - 424 K/uL   MPV 8.2  0 - 99.8 fL  POCT URINALYSIS DIPSTICK      Result Value Range   Color, UA amber     Clarity, UA clear     Glucose, UA neg     Bilirubin, UA neg     Ketones, UA neg     Spec Grav, UA 1.025     Blood, UA trace-intact     pH, UA 6.0     Protein, UA neg     Urobilinogen, UA 0.2     Nitrite, UA neg     Leukocytes, UA Negative     [facility] reading (PRIMARY) by  Dr. Alwyn Ren Normal xray.  Nonspecific gas stool  pattern.  Phlebolith llq  Reviewed colonoscopy report from last year.  Assessment: Low abdominal pain Probable diverticulitis  Treat with antibiotics Return if worse. Cautioned about risk of confusion on Levaquin. Return if in all worse.  Marland Kitchen

## 2013-02-27 ENCOUNTER — Telehealth: Payer: Self-pay

## 2013-02-27 NOTE — Telephone Encounter (Signed)
The 2 different antibiotics get 2 different types of organisms that in fact diverticulitis. The Cipro handles the aerobic bacteria, and the metronidazole handles the anaerobic bacteria. It is routine to treat diverticulitis with both. If she absolutely cannot take both just give her the Cipro.

## 2013-02-27 NOTE — Telephone Encounter (Signed)
Daughter/caregiver, Felipa Eth, called back to clarify if two different Abxs were Rxd for pt. Checked OV notes and advised her that Dr Alwyn Ren had Rxd Levaquin and Flagyl. Daughter was hesitant to give pt 2 Abxs at a time because it is difficult to get pt to eat in the evenings and is afraid he will get GI upset. D/W her use of probiotics/yogurt and taking Abx w/food. Dr Alwyn Ren, Felipa Eth requested that I ask you if it is necessary for him to take both Abxs? She has taken Flagyl in the past for a parasite and it made her feel terrible. Please advise.

## 2013-02-28 NOTE — Telephone Encounter (Signed)
Called patient's daughter to advise. 

## 2013-03-02 DIAGNOSIS — M169 Osteoarthritis of hip, unspecified: Secondary | ICD-10-CM | POA: Diagnosis not present

## 2013-03-06 DIAGNOSIS — Z85828 Personal history of other malignant neoplasm of skin: Secondary | ICD-10-CM | POA: Diagnosis not present

## 2013-03-06 DIAGNOSIS — L821 Other seborrheic keratosis: Secondary | ICD-10-CM | POA: Diagnosis not present

## 2013-03-06 DIAGNOSIS — D1801 Hemangioma of skin and subcutaneous tissue: Secondary | ICD-10-CM | POA: Diagnosis not present

## 2013-03-06 DIAGNOSIS — L739 Follicular disorder, unspecified: Secondary | ICD-10-CM | POA: Diagnosis not present

## 2013-03-06 DIAGNOSIS — J309 Allergic rhinitis, unspecified: Secondary | ICD-10-CM | POA: Diagnosis not present

## 2013-03-06 DIAGNOSIS — L570 Actinic keratosis: Secondary | ICD-10-CM | POA: Diagnosis not present

## 2013-03-08 DIAGNOSIS — M169 Osteoarthritis of hip, unspecified: Secondary | ICD-10-CM | POA: Diagnosis not present

## 2013-03-12 DIAGNOSIS — M169 Osteoarthritis of hip, unspecified: Secondary | ICD-10-CM | POA: Diagnosis not present

## 2013-03-15 ENCOUNTER — Telehealth: Payer: Self-pay

## 2013-03-15 NOTE — Telephone Encounter (Signed)
Patient's daughter calling about patient's bloodwork. Has a few questions. Please call Homero Fellers [phone]

## 2013-03-15 NOTE — Telephone Encounter (Signed)
Called her and she was concerned about hemoglobin 14.0 advised her not to be concerned about this, we do not typically get concerned unless it is much lower. She also wants you to know he has done very well with his Physical therapy at [facility] and has made excellent progress. She is very pleased with them

## 2013-03-16 DIAGNOSIS — M169 Osteoarthritis of hip, unspecified: Secondary | ICD-10-CM | POA: Diagnosis not present

## 2013-03-19 DIAGNOSIS — M169 Osteoarthritis of hip, unspecified: Secondary | ICD-10-CM | POA: Diagnosis not present

## 2013-03-19 DIAGNOSIS — J309 Allergic rhinitis, unspecified: Secondary | ICD-10-CM | POA: Diagnosis not present

## 2013-03-23 DIAGNOSIS — M169 Osteoarthritis of hip, unspecified: Secondary | ICD-10-CM | POA: Diagnosis not present

## 2013-03-26 DIAGNOSIS — M169 Osteoarthritis of hip, unspecified: Secondary | ICD-10-CM | POA: Diagnosis not present

## 2013-03-30 DIAGNOSIS — M169 Osteoarthritis of hip, unspecified: Secondary | ICD-10-CM | POA: Diagnosis not present

## 2013-04-02 DIAGNOSIS — M169 Osteoarthritis of hip, unspecified: Secondary | ICD-10-CM | POA: Diagnosis not present

## 2013-04-05 ENCOUNTER — Telehealth: Payer: Self-pay

## 2013-04-05 DIAGNOSIS — R109 Unspecified abdominal pain: Secondary | ICD-10-CM

## 2013-04-05 DIAGNOSIS — R6881 Early satiety: Secondary | ICD-10-CM

## 2013-04-05 MED ORDER — OMEPRAZOLE 20 MG PO CPDR: 20.0000 mg | DELAYED_RELEASE_CAPSULE | Freq: Every day | ORAL | Status: DC

## 2013-04-05 NOTE — Telephone Encounter (Signed)
Sent to prime mail 

## 2013-04-05 NOTE — Telephone Encounter (Signed)
Pended please advise.  

## 2013-04-05 NOTE — Telephone Encounter (Signed)
Madelin Rear from [employer] [facility] is calling in regards to having pts omeprazole refilled. He states that they were unable to send over the rx electronically, also the rx can be e-scribed or called in at [id number] option 2 reference# [id number].

## 2013-04-09 DIAGNOSIS — M169 Osteoarthritis of hip, unspecified: Secondary | ICD-10-CM | POA: Diagnosis not present

## 2013-04-12 ENCOUNTER — Telehealth: Payer: Self-pay | Admitting: Neurology

## 2013-04-16 NOTE — Telephone Encounter (Signed)
LMVM for Val, that returned call re: pt.

## 2013-04-16 NOTE — Telephone Encounter (Signed)
I have tried unsuccessfully to reach the patient and the daughter. The patient has apparently had an exacerbation of his underlying functional level. If this was a rapid sudden change, a medical workup may be indicated to exclude a medical condition that would be causing this. I'll try to get the patient worked in soon.

## 2013-04-18 ENCOUNTER — Telehealth: Payer: Self-pay | Admitting: *Deleted

## 2013-04-18 MED ORDER — SERTRALINE HCL 50 MG PO TABS: 50.0000 mg | ORAL_TABLET | Freq: Two times a day (BID) | ORAL | Status: DC

## 2013-04-18 NOTE — Telephone Encounter (Signed)
Daughter called and relayed concern about father's behavior (memory, agitation, ).  Qeustioning worsening dementia.  I relayed that if sudden change can be medical problem.  Has had change in med for incontinence and is having sx of urgency/incontinence.  I told her to have father checked for medical issues by pcp and then if those things checked out then to contact our office for sooner appt as per Dr. Clarisa Kindred last phone note.  I also relayed to her [email or web address] website about dementia, relating stages, information, diagrams,  support groups, chat rooms etc.  She would look into this.

## 2013-04-18 NOTE — Telephone Encounter (Signed)
I Called the daughter. The patient has had a several month gradual change in his cognitive abilities. The patient is becoming more withdrawn, more needy, and he is having more problems with word finding. I suspect this is the Alzheimer's process that is progressing. No need for an urgent revisit. The patient will be seen in September of 2014. The daughter is trying to get [facility] benefits to help with an extended care facility.

## 2013-04-24 ENCOUNTER — Telehealth: Payer: Self-pay | Admitting: Neurology

## 2013-04-25 DIAGNOSIS — N3941 Urge incontinence: Secondary | ICD-10-CM | POA: Diagnosis not present

## 2013-04-25 DIAGNOSIS — R350 Frequency of micturition: Secondary | ICD-10-CM | POA: Diagnosis not present

## 2013-05-16 ENCOUNTER — Other Ambulatory Visit: Payer: Self-pay

## 2013-05-28 ENCOUNTER — Telehealth: Payer: Self-pay | Admitting: Neurology

## 2013-05-30 NOTE — Telephone Encounter (Signed)
I spoke with daughter.  She had questions regarding Namenda.  Explained the regular release is no longer available, and patients are being changed to XR once daily formulation.  At this time, there is no generic available.  She verbalized understanding.  They will call us when a refill is needed, as they do not need meds at this time.

## 2013-06-26 ENCOUNTER — Other Ambulatory Visit: Payer: Self-pay

## 2013-06-26 DIAGNOSIS — R6881 Early satiety: Secondary | ICD-10-CM

## 2013-06-26 DIAGNOSIS — R109 Unspecified abdominal pain: Secondary | ICD-10-CM

## 2013-06-26 MED ORDER — OMEPRAZOLE 20 MG PO CPDR: 20.0000 mg | DELAYED_RELEASE_CAPSULE | Freq: Every day | ORAL | Status: DC

## 2013-06-29 ENCOUNTER — Other Ambulatory Visit: Payer: Self-pay

## 2013-06-29 DIAGNOSIS — R6881 Early satiety: Secondary | ICD-10-CM

## 2013-06-29 DIAGNOSIS — R109 Unspecified abdominal pain: Secondary | ICD-10-CM

## 2013-06-29 MED ORDER — OMEPRAZOLE 20 MG PO CPDR: 20.0000 mg | DELAYED_RELEASE_CAPSULE | Freq: Every day | ORAL | Status: DC

## 2013-06-29 NOTE — Telephone Encounter (Signed)
Received req from [facility] for pt's omeprazole. Sending in 90 day supply w/note pt needs OV for more.

## 2013-07-04 ENCOUNTER — Ambulatory Visit (INDEPENDENT_AMBULATORY_CARE_PROVIDER_SITE_OTHER): Payer: Medicare Other | Admitting: Internal Medicine

## 2013-07-04 VITALS — BP 122/60 | HR 60 | Temp 98.0°F | Resp 16 | Ht 66.0 in | Wt 159.0 lb

## 2013-07-04 DIAGNOSIS — F05 Delirium due to known physiological condition: Secondary | ICD-10-CM

## 2013-07-04 DIAGNOSIS — R413 Other amnesia: Secondary | ICD-10-CM

## 2013-07-04 DIAGNOSIS — I10 Essential (primary) hypertension: Secondary | ICD-10-CM | POA: Diagnosis not present

## 2013-07-04 DIAGNOSIS — F9821 Rumination disorder of infancy: Secondary | ICD-10-CM | POA: Diagnosis not present

## 2013-07-04 DIAGNOSIS — M899 Disorder of bone, unspecified: Secondary | ICD-10-CM

## 2013-07-04 DIAGNOSIS — K219 Gastro-esophageal reflux disease without esophagitis: Secondary | ICD-10-CM

## 2013-07-04 DIAGNOSIS — E785 Hyperlipidemia, unspecified: Secondary | ICD-10-CM

## 2013-07-04 DIAGNOSIS — F0280 Dementia in other diseases classified elsewhere without behavioral disturbance: Secondary | ICD-10-CM

## 2013-07-04 LAB — COMPREHENSIVE METABOLIC PANEL
ALT: 19 U/L (ref 0–53)
AST: 28 U/L (ref 0–37)
Albumin: 4.1 g/dL (ref 3.5–5.2)
Alkaline Phosphatase: 62 U/L (ref 39–117)
Calcium: 9.5 mg/dL (ref 8.4–10.5)
Chloride: 101 mEq/L (ref 96–112)
Potassium: 4.3 mEq/L (ref 3.5–5.3)
Sodium: 140 mEq/L (ref 135–145)
Total Protein: 6.7 g/dL (ref 6.0–8.3)

## 2013-07-04 LAB — CBC WITH DIFFERENTIAL/PLATELET
Basophils Absolute: 0.1 10*3/uL (ref 0.0–0.1)
Basophils Relative: 1 % (ref 0–1)
Eosinophils Absolute: 0.2 10*3/uL (ref 0.0–0.7)
HCT: 40.2 % (ref 39.0–52.0)
Hemoglobin: 14.1 g/dL (ref 13.0–17.0)
MCH: 33.7 pg (ref 26.0–34.0)
MCHC: 35.1 g/dL (ref 30.0–36.0)
Monocytes Absolute: 0.8 10*3/uL (ref 0.1–1.0)
Monocytes Relative: 13 % — ABNORMAL HIGH (ref 3–12)
Neutro Abs: 3.2 10*3/uL (ref 1.7–7.7)
Neutrophils Relative %: 48 % (ref 43–77)
RDW: 13.7 % (ref 11.5–15.5)

## 2013-07-04 LAB — IRON AND TIBC
TIBC: 353 ug/dL (ref 215–435)
UIBC: 238 ug/dL (ref 125–400)

## 2013-07-04 NOTE — Progress Notes (Signed)
Subjective:    Patient ID: Daniel [name], male    DOB: 1921/03/09, 79 y.o.   MRN: [id number]  HPI Patient presents with daughter who is requesting blood work prior to visit to Dr. Anne Hahn. Patient turned 89 last week, "getting by." Saw urologist for hematuria. No infection. May look further at prostate in future. Has periods of lucidity as well as some disconnect and increased agitation. Daughter suggests unusual new typr of confusion sporadically. Sertraline increased; patient was talking about killing himself for 1 week, so daughter decr it-- stopped talking about it.  OCD very bad. Wants to control everyone else, can't wait for anything. Ruminates. Walks twice a day. Is inappropriate with daughter, confuses her with his wife.  Patient Active Problem List   Diagnosis Date Noted  . BPH (benign prostatic hyperplasia) 01/24/2013  . GERD (gastroesophageal reflux disease) 01/24/2013  . HTN (hypertension) 01/24/2013  . Other and unspecified hyperlipidemia 01/24/2013  . Diverticular disease of colon 01/24/2013  . Alzheimer's disease 01/03/2013   Current outpatient prescriptions:aspirin 81 MG tablet, Take 81 mg by mouth daily., Disp: , Rfl: ;  donepezil (ARICEPT) 10 MG tablet, Take 10 mg by mouth at bedtime as needed., Disp: , Rfl: ;  fluticasone (FLONASE) 50 MCG/ACT nasal spray, Place 2 sprays into the nose daily., Disp: , Rfl: ;  memantine (NAMENDA) 10 MG tablet, Take 10 mg by mouth 2 (two) times daily., Disp: , Rfl:  mirabegron ER (MYRBETRIQ) 25 MG TB24, Take 25 mg by mouth daily., Disp: , Rfl: ;  Multiple Vitamins-Minerals (PRESERVISION AREDS) TABS, Take 1 tablet by mouth daily., Disp: , Rfl: ;  NONFORMULARY OR COMPOUNDED ITEM, Allergy injection one weekly, Disp: , Rfl: ;  olopatadine (PATANOL) 0.1 % ophthalmic solution, 1 drop 2 (two) times daily., Disp: , Rfl:  omeprazole (PRILOSEC) 20 MG capsule, Take 1 capsule (20 mg total) by mouth daily. PATIENT NEEDS OFFICE VISIT FOR ADDITIONAL REFILLS,  Disp: 90 capsule, Rfl: 0;  sertraline (ZOLOFT) 50 MG tablet, Take 1 tablet (50 mg total) by mouth 2 (two) times daily., Disp: 60 tablet, Rfl: 3;  levofloxacin (LEVAQUIN) 500 MG tablet, Take 1 tablet (500 mg total) by mouth daily., Disp: 7 tablet, Rfl: 0;  Loteprednol Etabonate (ALREX OP), Apply to eye., Disp: , Rfl:  metroNIDAZOLE (FLAGYL) 250 MG tablet, Take 1 tablet (250 mg total) by mouth 3 (three) times daily., Disp: 21 tablet, Rfl: 0  Review of Systems No other problems except as outlined in PI except continues with multiple joint completes    Objective:   Physical Exam BP 122/60  Pulse 60  Temp(Src) 98 F (36.7 C) (Oral)  Resp 16  Ht 5\' 6"  (1.676 m)  Wt 159 lb (72.122 kg)  BMI 25.68 kg/m2  SpO2 98% Friendly //answers/spon=old stories Impatient tho mostly quiet Ht reg Gait wnl x sl stiff at first CN2-12 intact      Assessment & Plan:  Rumination disorder  - Plan: Iron and TIBC  Acute confusional state - Plan: CBC with Differential, Vit D  25 hydroxy (rtn osteoporosis monitoring), Amylase, Vitamin B12, Iron and TIBC, Comprehensive metabolic panel, TSH  Memory loss - Plan: CBC with Differential, Vit D  25 hydroxy (rtn osteoporosis monitoring), Amylase, Vitamin B12, Iron and TIBC, Comprehensive metabolic panel, TSH  Disorder of bone and cartilage  - Plan: Vit D  25 hydroxy (rtn osteoporosis monitoring)  Alzheimer's disease  GERD (gastroesophageal reflux disease)  HTN (hypertension) - Plan: CBC with Differential, Comprehensive metabolic panel  Other and unspecified  hyperlipidemia  Will try to r/o anything that might improve life by treating.  Addendum: Results for orders placed in visit on 07/04/13  CBC WITH DIFFERENTIAL      Result Value Range   WBC 6.4  4.0 - 10.5 K/uL   RBC 4.19 (*) 4.22 - 5.81 MIL/uL   Hemoglobin 14.1  13.0 - 17.0 g/dL   HCT 04.5  40.9 - 81.1 %   MCV 95.9  78.0 - 100.0 fL   MCH 33.7  26.0 - 34.0 pg   MCHC 35.1  30.0 - 36.0 g/dL   RDW 91.4   78.2 - 95.6 %   Platelets 222  150 - 400 K/uL   Neutrophils Relative % 48  43 - 77 %   Neutro Abs 3.2  1.7 - 7.7 K/uL   Lymphocytes Relative 35  12 - 46 %   Lymphs Abs 2.3  0.7 - 4.0 K/uL   Monocytes Relative 13 (*) 3 - 12 %   Monocytes Absolute 0.8  0.1 - 1.0 K/uL   Eosinophils Relative 3  0 - 5 %   Eosinophils Absolute 0.2  0.0 - 0.7 K/uL   Basophils Relative 1  0 - 1 %   Basophils Absolute 0.1  0.0 - 0.1 K/uL   Smear Review Criteria for review not met    VITAMIN D 25 HYDROXY      Result Value Range   Vit D, 25-Hydroxy 28 (*) 30 - 89 ng/mL  AMYLASE      Result Value Range   Amylase 69  0 - 105 U/L  VITAMIN B12      Result Value Range   Vitamin B-12 489  211 - 911 pg/mL  IRON AND TIBC      Result Value Range   Iron 115  42 - 165 ug/dL   UIBC 213  086 - 578 ug/dL   TIBC 469  629 - 528 ug/dL   %SAT 33  20 - 55 %  COMPREHENSIVE METABOLIC PANEL      Result Value Range   Sodium 140  135 - 145 mEq/L   Potassium 4.3  3.5 - 5.3 mEq/L   Chloride 101  96 - 112 mEq/L   CO2 28  19 - 32 mEq/L   Glucose, Bld-----not fasting 133 (*) 70 - 99 mg/dL   BUN 11  6 - 23 mg/dL   Creat 4.13  2.44 - 0.10 mg/dL   Total Bilirubin 0.4  0.3 - 1.2 mg/dL   Alkaline Phosphatase 62  39 - 117 U/L   AST 28  0 - 37 U/L   ALT 19  0 - 53 U/L   Total Protein 6.7  6.0 - 8.3 g/dL   Albumin 4.1  3.5 - 5.2 g/dL   Calcium 9.5  8.4 - 27.2 mg/dL  TSH      Result Value Range   TSH 1.101  0.350 - 4.500 uIU/mL

## 2013-07-06 ENCOUNTER — Encounter: Payer: Self-pay | Admitting: Internal Medicine

## 2013-07-10 ENCOUNTER — Ambulatory Visit (INDEPENDENT_AMBULATORY_CARE_PROVIDER_SITE_OTHER): Payer: Medicare Other | Admitting: Neurology

## 2013-07-10 ENCOUNTER — Encounter: Payer: Self-pay | Admitting: Neurology

## 2013-07-10 VITALS — BP 128/58 | HR 60 | Wt 160.0 lb

## 2013-07-10 DIAGNOSIS — F0280 Dementia in other diseases classified elsewhere without behavioral disturbance: Secondary | ICD-10-CM | POA: Diagnosis not present

## 2013-07-10 MED ORDER — MEMANTINE HCL 10 MG PO TABS: 10.0000 mg | ORAL_TABLET | Freq: Two times a day (BID) | ORAL | Status: DC

## 2013-07-10 MED ORDER — SERTRALINE HCL 25 MG PO TABS: ORAL_TABLET | ORAL | Status: DC

## 2013-07-10 NOTE — Progress Notes (Signed)
Reason for visit: Memory disorder  Daniel [name] is an 78 y.o. male  History of present illness:  Daniel [name] is a 79 year old right-handed white male with a history of progressive memory disorder. The patient lives alone, but his daughter lives right next to him. The patient has not given up any activities of daily living since last seen. The patient is having some problems with agitation, and his Zoloft has been increased recently. The patient has a history of obsessive-compulsive disorder, and these issues are part of his anxiety and agitation. The patient is having increasing problems with word finding, and remembering faces and names of people. The patient has had some problems with putting household cleaners into the refrigerator, and the family has taken these products out of the house. The patient will fix himself lunch at times, and his family usually fixes him dinner. The patient has not had any falls, and he walks 2 miles a day. The patient is sleeping well. The patient returns for an evaluation. The patient is on Namenda and Aricept. The patient has a runny nose on the Aricept.  Past Medical History  Diagnosis Date  . Memory loss   . OCD (obsessive compulsive disorder)   . Dysphagia     Mild  . Gastroesophageal reflux disease   . Degenerative arthritis   . Dyslipidemia   . Hearing loss   . Diverticulitis   . Left bundle branch block   . Nocturnal leg cramps   . Allergy   . Seizures   . Substance abuse   . Ulcer   . Cataract   . Anxiety   . Hypertension   . Hearing difficulty     bilateral hearing aids    Past Surgical History  Procedure Laterality Date  . Tonsillectomy    . Turp vaporization    . Cataract extraction Bilateral   . Basal cell carcinoma excision    . Prostate surgery    . Eye surgery      Family History  Problem Relation Age of Onset  . Heart attack Mother   . Dementia Father   . Heart attack Sister   . Heart disease Sister   . Heart attack  Brother   . Heart attack Sister   . Heart attack Brother   . Stroke Brother   . Heart disease Daughter   . Heart disease Paternal Grandmother     Social history:  reports that he has quit smoking. He does not have any smokeless tobacco history on file. He reports that he does not drink alcohol or use illicit drugs.    Allergies  Allergen Reactions  . Biaxin [Clarithromycin]   . Sanctura [Trospium]     Medications:  Current Outpatient Prescriptions on File Prior to Visit  Medication Sig Dispense Refill  . aspirin 81 MG tablet Take 81 mg by mouth daily.      Marland Kitchen donepezil (ARICEPT) 10 MG tablet Take 10 mg by mouth at bedtime.       . fluticasone (FLONASE) 50 MCG/ACT nasal spray Place 2 sprays into the nose daily.      . Multiple Vitamins-Minerals (PRESERVISION AREDS) TABS Take 1 tablet by mouth daily.      Marland Kitchen olopatadine (PATANOL) 0.1 % ophthalmic solution 1 drop 2 (two) times daily.      Marland Kitchen omeprazole (PRILOSEC) 20 MG capsule Take 1 capsule (20 mg total) by mouth daily. PATIENT NEEDS OFFICE VISIT FOR ADDITIONAL REFILLS  90 capsule  0  No current facility-administered medications on file prior to visit.    ROS:  Out of a complete 14 system review of symptoms, the patient complains only of the following symptoms, and all other reviewed systems are negative.  Hearing loss Itching Incontinence, blood in the urine Easy bruising Joint pain, muscle cramps Runny nose Memory loss, confusion, numbness of the left arm Anxiety, agitation Change in appetite  Blood pressure 128/58, pulse 60, weight 160 lb (72.576 kg).  Physical Exam  General: The patient is alert and cooperative at the time of the examination.  Skin: No significant peripheral edema is noted.   Neurologic Exam  Mental status: Mini-Mental status examination done today shows a total score of 18/30.  Cranial nerves: Facial symmetry is present. Speech is normal, no aphasia or dysarthria is noted. Extraocular  movements are full. Visual fields are full.  Motor: The patient has good strength in all 4 extremities.  Coordination: The patient has good finger-nose-finger and heel-to-shin bilaterally.  Gait and station: The patient has a normal gait. Tandem gait was not tested. Romberg is negative. No drift is seen.  Reflexes: Deep tendon reflexes are symmetric.   Assessment/Plan:  1. Memory disturbance, Alzheimer's disease  2. Obsessive-compulsive disorder  The patient will continue the Aricept and Namenda for now. The patient will followup in 6-8 months. The patient will contact our office if any new issues arise.  Marlan Palau MD 07/10/2013 1:28 PM  [facility] [address] [place], [place] [address]  Phone [phone] Fax [phone]

## 2013-07-12 DIAGNOSIS — H353190 Nonexudative age-related macular degeneration, unspecified eye, stage unspecified: Secondary | ICD-10-CM | POA: Diagnosis not present

## 2013-08-16 ENCOUNTER — Other Ambulatory Visit: Payer: Self-pay

## 2013-08-17 ENCOUNTER — Other Ambulatory Visit: Payer: Self-pay | Admitting: Neurology

## 2013-08-17 MED ORDER — MEMANTINE HCL 10 MG PO TABS: 10.0000 mg | ORAL_TABLET | Freq: Two times a day (BID) | ORAL | Status: DC

## 2013-08-23 ENCOUNTER — Ambulatory Visit (INDEPENDENT_AMBULATORY_CARE_PROVIDER_SITE_OTHER): Payer: Medicare Other | Admitting: Emergency Medicine

## 2013-08-23 ENCOUNTER — Ambulatory Visit: Payer: Medicare Other

## 2013-08-23 VITALS — BP 130/80 | HR 58 | Temp 98.0°F | Resp 16 | Ht 66.5 in | Wt 155.0 lb

## 2013-08-23 DIAGNOSIS — R109 Unspecified abdominal pain: Secondary | ICD-10-CM

## 2013-08-23 LAB — POCT UA - MICROSCOPIC ONLY
Bacteria, U Microscopic: NEGATIVE
Casts, Ur, LPF, POC: NEGATIVE
Epithelial cells, urine per micros: NEGATIVE
Mucus, UA: NEGATIVE
Yeast, UA: NEGATIVE

## 2013-08-23 LAB — POCT CBC
Granulocyte percent: 54.2 %G (ref 37–80)
HCT, POC: 47.3 % (ref 43.5–53.7)
Hemoglobin: 15.2 g/dL (ref 14.1–18.1)
MCH, POC: 33.3 pg — AB (ref 27–31.2)
MCV: 103.6 fL — AB (ref 80–97)
POC MID %: 10 %M (ref 0–12)
RBC: 4.57 M/uL — AB (ref 4.69–6.13)
WBC: 7.7 10*3/uL (ref 4.6–10.2)

## 2013-08-23 LAB — POCT URINALYSIS DIPSTICK
Bilirubin, UA: NEGATIVE
Glucose, UA: NEGATIVE
Ketones, UA: NEGATIVE
Spec Grav, UA: 1.01
Urobilinogen, UA: 0.2

## 2013-08-23 NOTE — Progress Notes (Addendum)
This chart was scribed for Daniel Chris, MD by Ardelia Mems, Scribe. This patient was seen in room 10 and the patient's care was started at 3:14 PM.  Subjective:    Patient ID: Daniel [name], male    DOB: Feb 18, 1921, 79 y.o.   MRN: [id number]  Chief Complaint  Patient presents with  . Abdominal Pain    lower abdomen; has hx of diverticulitis.   HPI  HPI Comments: Daniel [name] is a 79 y.o. Male with a history of diverticulitis who presents to [facility] complaining of severe suprapubic abdominal pain today. Daughter in the room states that pt's severe pain onset suddenly today at 12:00 PM. She states that he called her and said he needed to see a doctor, and that this is unusual for him. Daughter states that pt's last diverticulitis flare-up was in 01/2013. Daughter states that pt also had a flare-up in 2013. Pt states that his pain is severely worsened with palpation and with walking. He states that he has never had pain this severe in the 90 years that he has been alive. He denies dysuria or other urinary symptoms. He states that he has no pain with producing stools. He has been diagnosed with alzheimer's disease and with dementia and daughter states that these symptoms are getting worse. Pt states that he feels like his body is "breaking down".   Past Medical History  Diagnosis Date  . Memory loss   . OCD (obsessive compulsive disorder)   . Dysphagia     Mild  . Gastroesophageal reflux disease   . Degenerative arthritis   . Dyslipidemia   . Hearing loss   . Diverticulitis   . Left bundle branch block   . Nocturnal leg cramps   . Allergy   . Seizures   . Substance abuse   . Ulcer   . Cataract   . Anxiety   . Hypertension   . Hearing difficulty     bilateral hearing aids   Current Outpatient Prescriptions on File Prior to Visit  Medication Sig Dispense Refill  . aspirin 81 MG tablet Take 81 mg by mouth daily.      Marland Kitchen donepezil (ARICEPT) 10 MG tablet Take 10  mg by mouth at bedtime.       . fluticasone (FLONASE) 50 MCG/ACT nasal spray Place 2 sprays into the nose daily.      . memantine (NAMENDA) 10 MG tablet Take 1 tablet (10 mg total) by mouth 2 (two) times daily.  180 tablet  1  . mirabegron ER (MYRBETRIQ) 50 MG TB24 tablet Take 50 mg by mouth daily.      . Multiple Vitamins-Minerals (PRESERVISION AREDS) TABS Take 1 tablet by mouth daily.      Marland Kitchen olopatadine (PATANOL) 0.1 % ophthalmic solution 1 drop 2 (two) times daily.      Marland Kitchen omeprazole (PRILOSEC) 20 MG capsule Take 1 capsule (20 mg total) by mouth daily. PATIENT NEEDS OFFICE VISIT FOR ADDITIONAL REFILLS  90 capsule  0  . sertraline (ZOLOFT) 25 MG tablet One tablet in the morning, two tablets in the evening       No current facility-administered medications on file prior to visit.   Allergies  Allergen Reactions  . Biaxin [Clarithromycin]   . Sanctura [Trospium]      Review of Systems  Gastrointestinal: Positive for abdominal pain.       No pain with producing stools  Genitourinary: Negative for dysuria.  BP 130/80  Pulse 58  Temp(Src) 98 F (36.7 C) (Oral)  Resp 16  Ht 5' 6.5" (1.689 m)  Wt 155 lb (70.308 kg)  BMI 24.65 kg/m2  SpO2 97%  Objective:   Physical Exam  CONSTITUTIONAL: Well developed/well nourished HEAD: Normocephalic/atraumatic EYES: EOMI/PERRL ENMT: Mucous membranes moist NECK: supple no meningeal signs SPINE:entire spine nontender CV: S1/S2 noted, no murmurs/rubs/gallops noted LUNGS: Lungs are clear to auscultation bilaterally, no apparent distress ABDOMEN: soft, no rebound or guarding GU:no cva tenderness NEURO: Pt is awake/alert, moves all extremitiesx4 EXTREMITIES: pulses normal, full ROM SKIN: warm, color normal PSYCH: no abnormalities of mood noted [facility] reading (PRIMARY) by  Dr. Cleta Alberts no obstruction seen no free air seen Results for orders placed in visit on 08/23/13  POCT UA - MICROSCOPIC ONLY      Result Value Range   WBC, Ur, HPF, POC 0-2      RBC, urine, microscopic 0-3     Bacteria, U Microscopic neg     Mucus, UA neg     Epithelial cells, urine per micros neg     Crystals, Ur, HPF, POC neg     Casts, Ur, LPF, POC neg     Yeast, UA neg    POCT URINALYSIS DIPSTICK      Result Value Range   Color, UA yellow     Clarity, UA clear     Glucose, UA neg     Bilirubin, UA neg     Ketones, UA neg     Spec Grav, UA 1.010     Blood, UA small     pH, UA 6.0     Protein, UA neg     Urobilinogen, UA 0.2     Nitrite, UA neg     Leukocytes, UA Trace    POCT CBC      Result Value Range   WBC 7.7  4.6 - 10.2 K/uL   Lymph, poc 2.8  0.6 - 3.4   POC LYMPH PERCENT 35.8  10 - 50 %L   MID (cbc) 0.8  0 - 0.9   POC MID % 10.0  0 - 12 %M   POC Granulocyte 4.2  2 - 6.9   Granulocyte percent 54.2  37 - 80 %G   RBC 4.57 (*) 4.69 - 6.13 M/uL   Hemoglobin 15.2  14.1 - 18.1 g/dL   HCT, POC 47.8  29.5 - 53.7 %   MCV 103.6 (*) 80 - 97 fL   MCH, POC 33.3 (*) 27 - 31.2 pg   MCHC 32.1  31.8 - 35.4 g/dL   RDW, POC 62.1     Platelet Count, POC 238  142 - 424 K/uL   MPV 8.6  0 - 99.8 fL  IFOBT (OCCULT BLOOD)      Result Value Range   IFOBT Negative         Assessment & Plan:  The encounter diagnosis was Abdominal  pain, other specified site. he did have a small easily reducible left inguinal hernia. There is no evidence of obstruction on his bowel films. He may have some element of constipation. His white cell count is normal and he is not running a fever. I decided to hold off treating him with antibiotics and recheck him in the morning by Dr. Perrin Maltese. He did have a small left inguinal hernia which was reduced. His followup abdominal exam revealed very minimal tenderness left lower abdomen. The daughter was advised take him to the emergency room if  he were to develop significant worsening of his abdominal pain.   I personally performed the services described in this documentation, which was scribed in my presence. The recorded information  has been reviewed and is accurate.

## 2013-09-10 DIAGNOSIS — D692 Other nonthrombocytopenic purpura: Secondary | ICD-10-CM | POA: Diagnosis not present

## 2013-09-10 DIAGNOSIS — Z85828 Personal history of other malignant neoplasm of skin: Secondary | ICD-10-CM | POA: Diagnosis not present

## 2013-09-10 DIAGNOSIS — D234 Other benign neoplasm of skin of scalp and neck: Secondary | ICD-10-CM | POA: Diagnosis not present

## 2013-09-10 DIAGNOSIS — L821 Other seborrheic keratosis: Secondary | ICD-10-CM | POA: Diagnosis not present

## 2013-09-10 DIAGNOSIS — L219 Seborrheic dermatitis, unspecified: Secondary | ICD-10-CM | POA: Diagnosis not present

## 2013-09-10 DIAGNOSIS — L570 Actinic keratosis: Secondary | ICD-10-CM | POA: Diagnosis not present

## 2013-09-25 IMAGING — CR DG HIP (WITH OR WITHOUT PELVIS) 2-3V*L*
2 series · 2 of 2 positions shown · non-contrast
Comparison: None.

CLINICAL DATA: Left hip pain

LEFT HIP - COMPLETE 2+ VIEW

[AP]
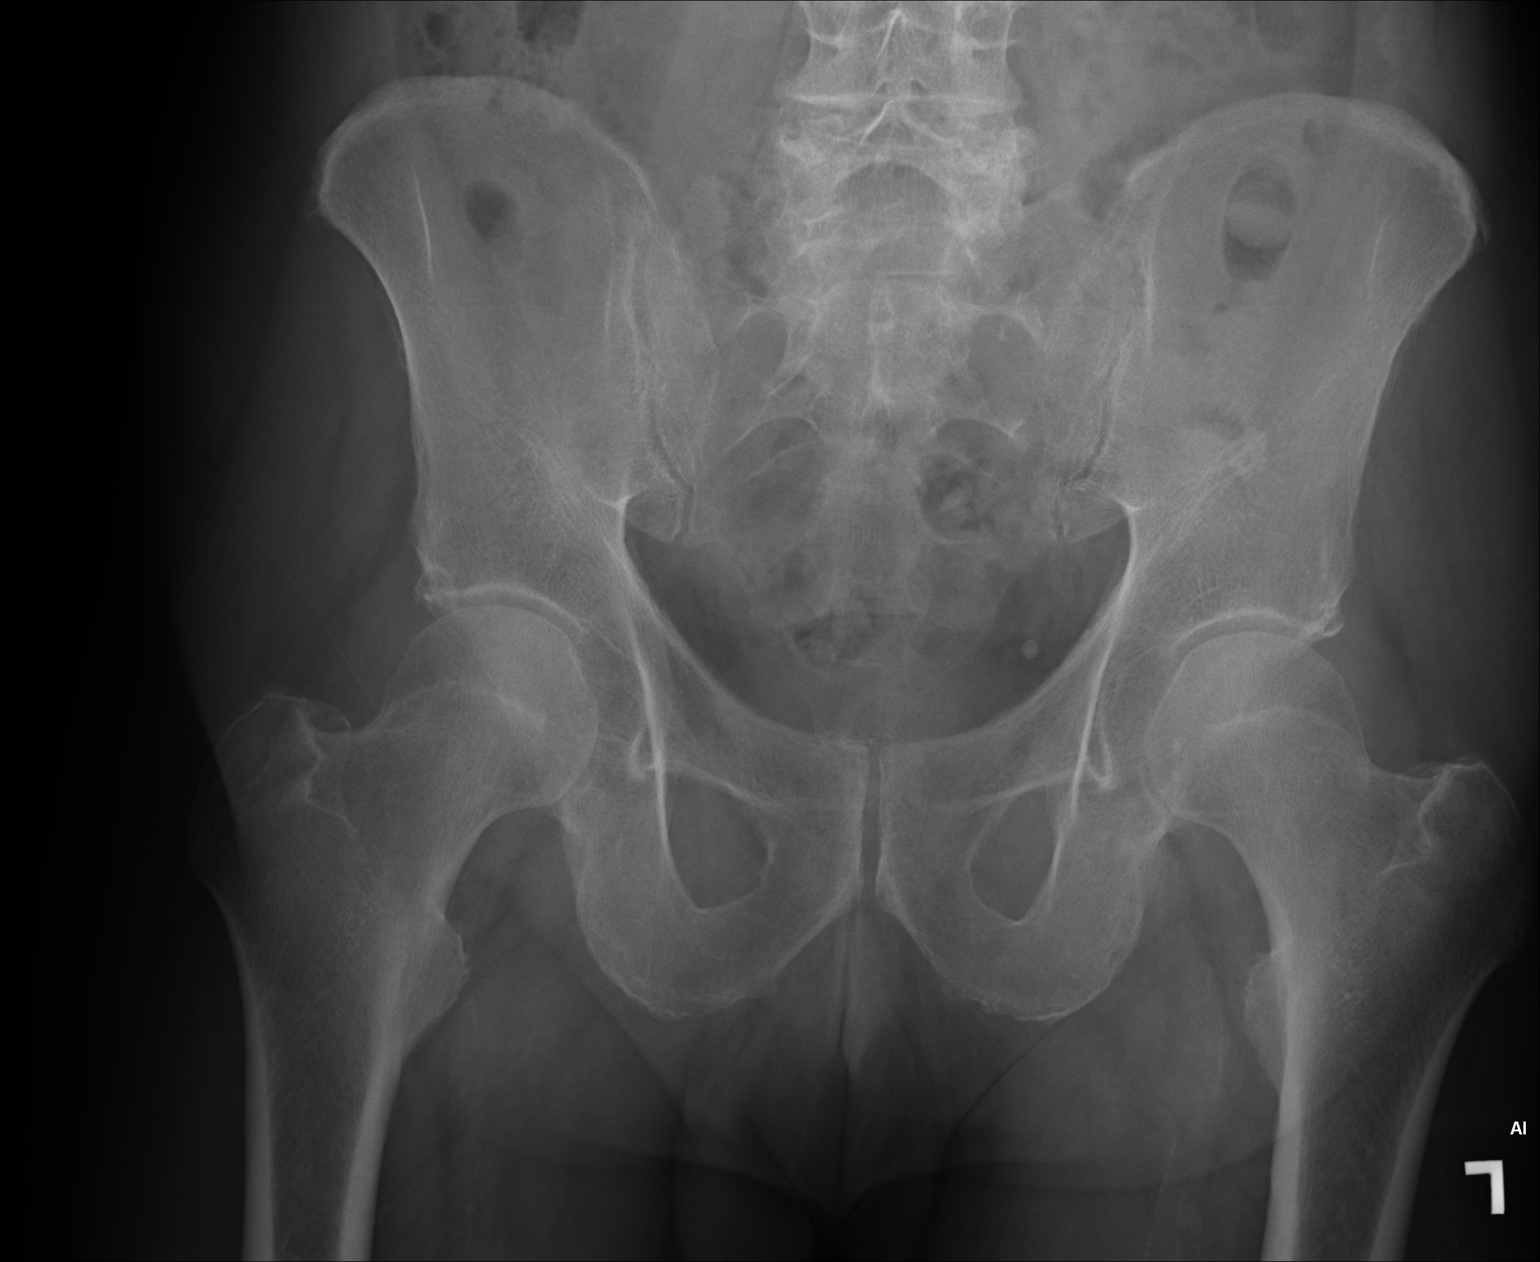

[lateral]
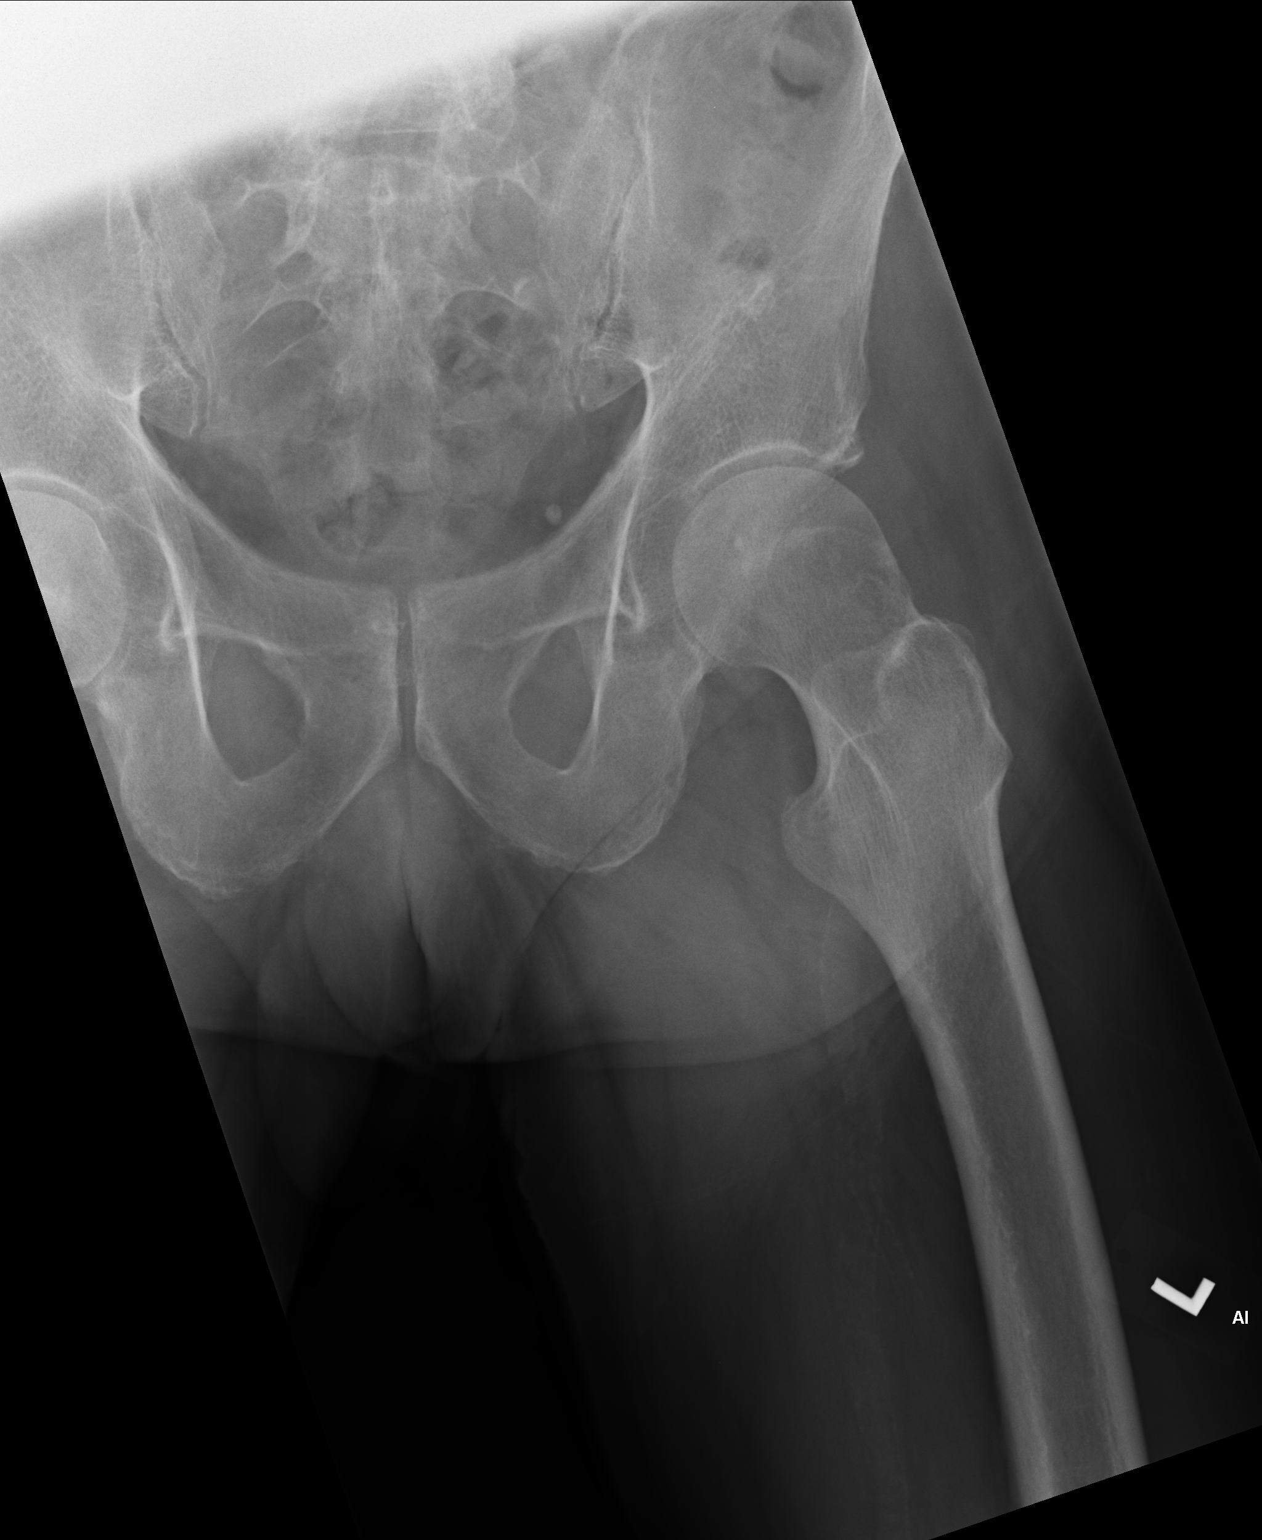

[2 of 2 positions shown; findings below may reference images not displayed]

FINDINGS: The left hip joint appears normal.  No evidence of
degenerative change or AVN.  No fracture or mass lesion.
IMPRESSION: Negative

## 2013-10-31 ENCOUNTER — Ambulatory Visit (INDEPENDENT_AMBULATORY_CARE_PROVIDER_SITE_OTHER): Payer: Medicare Other | Admitting: Family Medicine

## 2013-10-31 VITALS — BP 132/76 | HR 58 | Temp 98.3°F | Resp 16 | Ht 66.5 in | Wt 157.8 lb

## 2013-10-31 DIAGNOSIS — M79609 Pain in unspecified limb: Secondary | ICD-10-CM | POA: Diagnosis not present

## 2013-10-31 DIAGNOSIS — Z23 Encounter for immunization: Secondary | ICD-10-CM | POA: Diagnosis not present

## 2013-10-31 DIAGNOSIS — D485 Neoplasm of uncertain behavior of skin: Secondary | ICD-10-CM | POA: Diagnosis not present

## 2013-10-31 DIAGNOSIS — Z85828 Personal history of other malignant neoplasm of skin: Secondary | ICD-10-CM | POA: Diagnosis not present

## 2013-10-31 DIAGNOSIS — L98499 Non-pressure chronic ulcer of skin of other sites with unspecified severity: Secondary | ICD-10-CM | POA: Diagnosis not present

## 2013-10-31 MED ORDER — MUPIROCIN CALCIUM 2 % EX CREA: 1.0000 "application " | TOPICAL_CREAM | Freq: Two times a day (BID) | CUTANEOUS | Status: DC

## 2013-10-31 NOTE — Progress Notes (Signed)
Patient ID: Daniel [name] MRN: [id number], DOB: 06-Nov-1920, 80 y.o. Date of Encounter: 10/31/2013, 6:21 PM  Primary Physician: Leandrew Koyanagi, MD  Chief Complaint: Laceration underneath the nail bed of his right thumb  HPI: 80 y.o. year old male with history below presents to [facility] complaining of a laceration to the below the nailbed or his right thumb that occurred 1 day ago. He states that he noticed a new growth on his right thumb and attempted to cut it off. Pt does not recall when his last tetanus vaccine.    Past Medical History  Diagnosis Date   Memory loss    OCD (obsessive compulsive disorder)    Dysphagia     Mild   Gastroesophageal reflux disease    Degenerative arthritis    Dyslipidemia    Hearing loss    Diverticulitis    Left bundle branch block    Nocturnal leg cramps    Allergy    Seizures    Substance abuse    Ulcer    Cataract    Anxiety    Hypertension    Hearing difficulty     bilateral hearing aids     Home Meds: Prior to Admission medications   Medication Sig Start Date End Date Taking? Authorizing Provider  aspirin 81 MG tablet Take 81 mg by mouth daily.   Yes Historical Provider, MD  cholecalciferol (VITAMIN D) 1000 UNITS tablet Take 1,000 Units by mouth daily.   Yes Historical Provider, MD  donepezil (ARICEPT) 10 MG tablet Take 10 mg by mouth at bedtime.    Yes Historical Provider, MD  fluticasone (FLONASE) 50 MCG/ACT nasal spray Place 2 sprays into the nose daily.   Yes Historical Provider, MD  memantine (NAMENDA) 10 MG tablet Take 1 tablet (10 mg total) by mouth 2 (two) times daily. 08/17/13  Yes Kathrynn Ducking, MD  mirabegron ER (MYRBETRIQ) 50 MG TB24 tablet Take 50 mg by mouth daily.   Yes Historical Provider, MD  Multiple Vitamins-Minerals (PRESERVISION AREDS) TABS Take 1 tablet by mouth daily.   Yes Historical Provider, MD  olopatadine (PATANOL) 0.1 % ophthalmic solution 1 drop 2 (two) times  daily.   Yes Historical Provider, MD  omeprazole (PRILOSEC) 20 MG capsule Take 1 capsule (20 mg total) by mouth daily. PATIENT NEEDS OFFICE VISIT FOR ADDITIONAL REFILLS 06/29/13  Yes Theda Sers, PA-C  sertraline (ZOLOFT) 25 MG tablet One tablet in the morning, two tablets in the evening 07/10/13  Yes Kathrynn Ducking, MD    Allergies:  Allergies  Allergen Reactions   Biaxin [Clarithromycin]    Sanctura [Trospium]     History   Social History   Marital Status: Widowed    Spouse Name: N/A    Number of Children: N/A   Years of Education: N/A   Occupational History   Retired    Social History Main Topics   Smoking status: Former Smoker   Smokeless tobacco: Not on file   Alcohol Use: No     Comment: History of alcoholism, no longer drinking   Drug Use: No   Sexual Activity: No   Other Topics Concern   Not on file   Social History Narrative   Widow. Education: [employer]. Exercise walking 2 times a day for 20 minutes.     Review of Systems: Constitutional: negative for chills, fever, night sweats, weight changes, or fatigue  HEENT: negative for vision changes, hearing loss, congestion, rhinorrhea, ST, epistaxis,  or sinus pressure Cardiovascular: negative for chest pain or palpitations Respiratory: negative for hemoptysis, wheezing, shortness of breath, or cough Abdominal: negative for abdominal pain, nausea, vomiting, diarrhea, or constipation Dermatological: negative for rash Neurologic: negative for headache, dizziness, or syncope All other systems reviewed and are otherwise negative with the exception to those above and in the HPI.   Physical Exam: Blood pressure 132/76, pulse 58, temperature 98.3 F (36.8 C), temperature source Oral, resp. rate 16, height 5' 6.5" (1.689 m), weight 157 lb 12.8 oz (71.578 kg), SpO2 97.00%., Body mass index is 25.09 kg/(m^2). General: Well developed, well nourished, in no acute distress. Head: Normocephalic, atraumatic, eyes  without discharge, sclera non-icteric, nares are without discharge. Bilateral auditory canals clear, TM's are without perforation, pearly grey and translucent with reflective cone of light bilaterally. Oral cavity moist, posterior pharynx without exudate, erythema, peritonsillar abscess, or post nasal drip.  Neck: Supple. No thyromegaly. Full ROM. No lymphadenopathy. Lungs: Clear bilaterally to auscultation without wheezes, rales, or rhonchi. Breathing is unlabored. Heart: RRR with S1 S2. No murmurs, rubs, or gallops appreciated. Abdomen: Soft, non-tender, non-distended with normoactive bowel sounds. No hepatomegaly. No rebound/guarding. No obvious abdominal masses. Msk:  Strength and tone normal for age. Extremities/Skin: Warm and dry. No clubbing or cyanosis. No edema. Small suspicious lesion left thumb for mucus cyst Neuro: Alert and oriented X 3. Moves all extremities spontaneously. Gait is normal. CNII-XII grossly in tact. Psych:  Responds to questions appropriately with a normal affect.      ASSESSMENT AND PLAN:  80 y.o. year old male with Ulcer of finger - Plan: Tdap vaccine greater than or equal to 7yo IM, mupirocin cream (BACTROBAN) 2 %  Pain in limb  Need for diphtheria-tetanus-pertussis (Tdap) vaccine, adult/adolescent - Plan: Tdap vaccine greater than or equal to 7yo IM     Signed, Robyn Haber, MD 10/31/2013 6:21 PM

## 2013-11-04 ENCOUNTER — Other Ambulatory Visit: Payer: Self-pay | Admitting: Internal Medicine

## 2013-11-21 ENCOUNTER — Ambulatory Visit (INDEPENDENT_AMBULATORY_CARE_PROVIDER_SITE_OTHER): Payer: Medicare Other | Admitting: Internal Medicine

## 2013-11-21 VITALS — BP 145/56 | HR 53 | Temp 98.4°F | Resp 16 | Ht 67.5 in | Wt 157.8 lb

## 2013-11-21 DIAGNOSIS — F0390 Unspecified dementia without behavioral disturbance: Secondary | ICD-10-CM

## 2013-11-21 DIAGNOSIS — K1370 Unspecified lesions of oral mucosa: Secondary | ICD-10-CM

## 2013-11-21 DIAGNOSIS — F0280 Dementia in other diseases classified elsewhere without behavioral disturbance: Secondary | ICD-10-CM

## 2013-11-21 DIAGNOSIS — K1379 Other lesions of oral mucosa: Secondary | ICD-10-CM

## 2013-11-21 DIAGNOSIS — R21 Rash and other nonspecific skin eruption: Secondary | ICD-10-CM | POA: Diagnosis not present

## 2013-11-21 DIAGNOSIS — G309 Alzheimer's disease, unspecified: Secondary | ICD-10-CM

## 2013-11-21 NOTE — Progress Notes (Signed)
Subjective:    Patient ID: Daniel [name], male    DOB: 1921/05/20, 80 y.o.   MRN: [id number] This chart was scribed for [name], MD by Anastasia Pall, ED Scribe. This patient was seen in room 25 and the patient's care was started at 12:52 PM.  Chief Complaint  Patient presents with  . Follow-up    thyroid check  . Mass    left ear  . Mouth Lesions    under plate, x 3-4 weeks  . Rash    itchy skin   HPI Daniel [name] is a 80 y.o. male Pt presents for a follow up of his thyroid, brought in by his daughter.   Daughter reports he has gotten worse with his dementia. She states he is partially aware of his memory loss.   She states he had cyst over his thumb that he cut off himself.   He reports a frontal mouth lesion under his plate, onset 3-4 weeks. He reports associated pain with eating when he tries to chew with his front teeth. She reports he went to [facility] for his dental plate, but she states it did not go over well last visit due to him having anxiety attack. She states he has been more anxious in general recently.   She reports a mass behind his left ear she wants to have checked out. He denies the mass being painful to the touch. He reports it will intermittently itch. She reports pt has h/o skin cancer. She reports he has constant itching mainly over the top of his head and over his hands.   She denies pt being a part of any weekly activities, but states her father has always kept to himself. She states he walks 2 miles per good weather. She reports physical therapy was working well for him, but ever since finishing PT he has a hard time getting up with his back pain. She states once he is up and going he ambulates well.   PCP - Leandrew Koyanagi, MD  Patient Active Problem List   Diagnosis Date Noted  . BPH (benign prostatic hyperplasia) 01/24/2013  . GERD (gastroesophageal reflux disease) 01/24/2013  . HTN (hypertension) 01/24/2013  . Other and  unspecified hyperlipidemia 01/24/2013  . Diverticular disease of colon 01/24/2013  . Alzheimer's disease 01/03/2013   Prior to Admission medications   Medication Sig Start Date End Date Taking? Authorizing Provider  aspirin 81 MG tablet Take 81 mg by mouth daily.    Historical Provider, MD  cholecalciferol (VITAMIN D) 1000 UNITS tablet Take 1,000 Units by mouth daily.    Historical Provider, MD  donepezil (ARICEPT) 10 MG tablet Take 10 mg by mouth at bedtime.     Historical Provider, MD  fluticasone (FLONASE) 50 MCG/ACT nasal spray Place 2 sprays into the nose daily.    Historical Provider, MD  memantine (NAMENDA) 10 MG tablet Take 1 tablet (10 mg total) by mouth 2 (two) times daily. 08/17/13   Kathrynn Ducking, MD  mirabegron ER (MYRBETRIQ) 50 MG TB24 tablet Take 50 mg by mouth daily.    Historical Provider, MD  Multiple Vitamins-Minerals (PRESERVISION AREDS) TABS Take 1 tablet by mouth daily.    Historical Provider, MD  mupirocin cream (BACTROBAN) 2 % Apply 1 application topically 2 (two) times daily. 10/31/13   Robyn Haber, MD  olopatadine (PATANOL) 0.1 % ophthalmic solution 1 drop 2 (two) times daily.    Historical Provider, MD  omeprazole (PRILOSEC) 20  MG capsule Take 1 capsule (20 mg total) by mouth daily. PATIENT NEEDS OFFICE VISIT FOR ADDITIONAL REFILLS 06/29/13   Theda Sers, PA-C  omeprazole (PRILOSEC) 20 MG capsule TAKE 1 CAPSULE EVERY DAY 11/04/13   Leandrew Koyanagi, MD  sertraline (ZOLOFT) 25 MG tablet One tablet in the morning, two tablets in the evening 07/10/13   Kathrynn Ducking, MD   Review of Systems  HENT: Negative for ear pain.   Musculoskeletal: Positive for back pain (lower). Negative for gait problem.  Skin: Positive for wound (frontal mouth lesion under dental plate).       Itchiness over top of his head, over bilateral hands      Objective:   Physical Exam Nursing note and vitals reviewed. Constitutional: Pt is oriented to person, place, and time. Pt  appears well-developed and well-nourished. No distress.  HENT: Sebaceous cyst behind left ear. Erosions under his dentures anterior lower segment. 1 firm tender white lesion on posterior aspect of lower buckle mucosa.  Head: Normocephalic and atraumatic.  Eyes: EOM are normal.  Neck: Neck supple.  Cardiovascular: Normal rate. Pulmonary/Chest: Effort normal. Musculoskeletal: Normal range of motion. Neurological: Pt is alert and oriented to person, place, and time.  Skin: Skin is warm and dry.  Psychiatric: Pt has a normal mood and affect. Pt's behavior is normal.   BP 145/56  Pulse 53  Temp(Src) 98.4 F (36.9 C)  Resp 16  Ht 5' 7.5" (1.715 m)  Wt 157 lb 12.8 oz (71.578 kg)  BMI 24.34 kg/m2  SpO2 97%     Assessment & Plan:  Rash and nonspecific skin eruption--- mainly dry skin with eczematoid changes  Lotion and hydrocortisone when necessary Mouth pain--ill fitting lower dentures/1 lesion on the other aspect of the mucosa  that is in need of biopsy  Referred to oral surgeon Dementia-Alzheimer's disease-  No changes Hypertension   His days revolve around a lonely existence which he likes, with his daughter as caretaker-which is not good for her and very stressful.. reading and TV with an occasional walk are his main activities  45mos   I have completed the patient encounter in its entirety as documented by the scribe, with editing by me where necessary. Branndon Tuite P. Laney Pastor, M.D.

## 2013-11-29 DIAGNOSIS — D3701 Neoplasm of uncertain behavior of lip: Secondary | ICD-10-CM | POA: Diagnosis not present

## 2013-11-29 DIAGNOSIS — D3705 Neoplasm of uncertain behavior of pharynx: Secondary | ICD-10-CM | POA: Diagnosis not present

## 2014-01-02 ENCOUNTER — Encounter (HOSPITAL_COMMUNITY): Payer: Self-pay | Admitting: Emergency Medicine

## 2014-01-02 ENCOUNTER — Emergency Department (HOSPITAL_COMMUNITY): Payer: Medicare Other

## 2014-01-02 ENCOUNTER — Inpatient Hospital Stay (HOSPITAL_COMMUNITY)
Admission: EM | Admit: 2014-01-02 | Discharge: 2014-01-05 | DRG: 242 | Disposition: A | Discharge status: Skilled Nursing Facility | Payer: Medicare Other | Attending: Internal Medicine | Admitting: Internal Medicine

## 2014-01-02 DIAGNOSIS — K5730 Diverticulosis of large intestine without perforation or abscess without bleeding: Secondary | ICD-10-CM

## 2014-01-02 DIAGNOSIS — Z7982 Long term (current) use of aspirin: Secondary | ICD-10-CM

## 2014-01-02 DIAGNOSIS — I10 Essential (primary) hypertension: Secondary | ICD-10-CM | POA: Diagnosis not present

## 2014-01-02 DIAGNOSIS — I369 Nonrheumatic tricuspid valve disorder, unspecified: Secondary | ICD-10-CM | POA: Diagnosis not present

## 2014-01-02 DIAGNOSIS — E785 Hyperlipidemia, unspecified: Secondary | ICD-10-CM | POA: Diagnosis present

## 2014-01-02 DIAGNOSIS — N138 Other obstructive and reflux uropathy: Secondary | ICD-10-CM | POA: Diagnosis present

## 2014-01-02 DIAGNOSIS — R918 Other nonspecific abnormal finding of lung field: Secondary | ICD-10-CM | POA: Diagnosis not present

## 2014-01-02 DIAGNOSIS — K219 Gastro-esophageal reflux disease without esophagitis: Secondary | ICD-10-CM | POA: Diagnosis present

## 2014-01-02 DIAGNOSIS — Z950 Presence of cardiac pacemaker: Secondary | ICD-10-CM | POA: Diagnosis not present

## 2014-01-02 DIAGNOSIS — Z9849 Cataract extraction status, unspecified eye: Secondary | ICD-10-CM

## 2014-01-02 DIAGNOSIS — F0280 Dementia in other diseases classified elsewhere without behavioral disturbance: Secondary | ICD-10-CM | POA: Diagnosis not present

## 2014-01-02 DIAGNOSIS — S0993XA Unspecified injury of face, initial encounter: Secondary | ICD-10-CM | POA: Diagnosis not present

## 2014-01-02 DIAGNOSIS — S2239XA Fracture of one rib, unspecified side, initial encounter for closed fracture: Secondary | ICD-10-CM | POA: Diagnosis not present

## 2014-01-02 DIAGNOSIS — Y92009 Unspecified place in unspecified non-institutional (private) residence as the place of occurrence of the external cause: Secondary | ICD-10-CM

## 2014-01-02 DIAGNOSIS — N400 Enlarged prostate without lower urinary tract symptoms: Secondary | ICD-10-CM

## 2014-01-02 DIAGNOSIS — R269 Unspecified abnormalities of gait and mobility: Secondary | ICD-10-CM | POA: Diagnosis not present

## 2014-01-02 DIAGNOSIS — F429 Obsessive-compulsive disorder, unspecified: Secondary | ICD-10-CM | POA: Diagnosis present

## 2014-01-02 DIAGNOSIS — I509 Heart failure, unspecified: Secondary | ICD-10-CM | POA: Diagnosis not present

## 2014-01-02 DIAGNOSIS — I459 Conduction disorder, unspecified: Secondary | ICD-10-CM | POA: Diagnosis not present

## 2014-01-02 DIAGNOSIS — M1990 Unspecified osteoarthritis, unspecified site: Secondary | ICD-10-CM | POA: Diagnosis present

## 2014-01-02 DIAGNOSIS — R279 Unspecified lack of coordination: Secondary | ICD-10-CM | POA: Diagnosis not present

## 2014-01-02 DIAGNOSIS — Z79899 Other long term (current) drug therapy: Secondary | ICD-10-CM

## 2014-01-02 DIAGNOSIS — W010XXA Fall on same level from slipping, tripping and stumbling without subsequent striking against object, initial encounter: Secondary | ICD-10-CM | POA: Diagnosis present

## 2014-01-02 DIAGNOSIS — I5031 Acute diastolic (congestive) heart failure: Secondary | ICD-10-CM

## 2014-01-02 DIAGNOSIS — R55 Syncope and collapse: Secondary | ICD-10-CM

## 2014-01-02 DIAGNOSIS — F3289 Other specified depressive episodes: Secondary | ICD-10-CM | POA: Diagnosis present

## 2014-01-02 DIAGNOSIS — J45909 Unspecified asthma, uncomplicated: Secondary | ICD-10-CM | POA: Diagnosis not present

## 2014-01-02 DIAGNOSIS — Z8249 Family history of ischemic heart disease and other diseases of the circulatory system: Secondary | ICD-10-CM

## 2014-01-02 DIAGNOSIS — G40909 Epilepsy, unspecified, not intractable, without status epilepticus: Secondary | ICD-10-CM | POA: Diagnosis not present

## 2014-01-02 DIAGNOSIS — Z9181 History of falling: Secondary | ICD-10-CM | POA: Diagnosis not present

## 2014-01-02 DIAGNOSIS — M25569 Pain in unspecified knee: Secondary | ICD-10-CM | POA: Diagnosis not present

## 2014-01-02 DIAGNOSIS — Z87891 Personal history of nicotine dependence: Secondary | ICD-10-CM | POA: Diagnosis not present

## 2014-01-02 DIAGNOSIS — R51 Headache: Secondary | ICD-10-CM | POA: Diagnosis not present

## 2014-01-02 DIAGNOSIS — R609 Edema, unspecified: Secondary | ICD-10-CM | POA: Diagnosis not present

## 2014-01-02 DIAGNOSIS — R252 Cramp and spasm: Secondary | ICD-10-CM | POA: Diagnosis present

## 2014-01-02 DIAGNOSIS — F329 Major depressive disorder, single episode, unspecified: Secondary | ICD-10-CM | POA: Diagnosis present

## 2014-01-02 DIAGNOSIS — R339 Retention of urine, unspecified: Secondary | ICD-10-CM | POA: Diagnosis present

## 2014-01-02 DIAGNOSIS — N401 Enlarged prostate with lower urinary tract symptoms: Secondary | ICD-10-CM | POA: Diagnosis present

## 2014-01-02 DIAGNOSIS — I441 Atrioventricular block, second degree: Secondary | ICD-10-CM | POA: Diagnosis not present

## 2014-01-02 DIAGNOSIS — G309 Alzheimer's disease, unspecified: Secondary | ICD-10-CM | POA: Diagnosis not present

## 2014-01-02 DIAGNOSIS — I498 Other specified cardiac arrhythmias: Secondary | ICD-10-CM | POA: Diagnosis present

## 2014-01-02 DIAGNOSIS — W19XXXA Unspecified fall, initial encounter: Secondary | ICD-10-CM

## 2014-01-02 DIAGNOSIS — IMO0002 Reserved for concepts with insufficient information to code with codable children: Secondary | ICD-10-CM | POA: Diagnosis present

## 2014-01-02 DIAGNOSIS — S8263XA Displaced fracture of lateral malleolus of unspecified fibula, initial encounter for closed fracture: Secondary | ICD-10-CM | POA: Diagnosis present

## 2014-01-02 DIAGNOSIS — S99919A Unspecified injury of unspecified ankle, initial encounter: Secondary | ICD-10-CM | POA: Diagnosis not present

## 2014-01-02 DIAGNOSIS — R1310 Dysphagia, unspecified: Secondary | ICD-10-CM | POA: Diagnosis not present

## 2014-01-02 DIAGNOSIS — M7989 Other specified soft tissue disorders: Secondary | ICD-10-CM | POA: Diagnosis not present

## 2014-01-02 DIAGNOSIS — S298XXA Other specified injuries of thorax, initial encounter: Secondary | ICD-10-CM | POA: Diagnosis not present

## 2014-01-02 DIAGNOSIS — F411 Generalized anxiety disorder: Secondary | ICD-10-CM | POA: Diagnosis present

## 2014-01-02 DIAGNOSIS — H9190 Unspecified hearing loss, unspecified ear: Secondary | ICD-10-CM | POA: Diagnosis present

## 2014-01-02 DIAGNOSIS — M25579 Pain in unspecified ankle and joints of unspecified foot: Secondary | ICD-10-CM | POA: Diagnosis not present

## 2014-01-02 DIAGNOSIS — F1021 Alcohol dependence, in remission: Secondary | ICD-10-CM

## 2014-01-02 DIAGNOSIS — M542 Cervicalgia: Secondary | ICD-10-CM | POA: Diagnosis not present

## 2014-01-02 DIAGNOSIS — Z823 Family history of stroke: Secondary | ICD-10-CM

## 2014-01-02 DIAGNOSIS — M6281 Muscle weakness (generalized): Secondary | ICD-10-CM | POA: Diagnosis not present

## 2014-01-02 DIAGNOSIS — S0990XA Unspecified injury of head, initial encounter: Secondary | ICD-10-CM | POA: Diagnosis not present

## 2014-01-02 DIAGNOSIS — S8990XA Unspecified injury of unspecified lower leg, initial encounter: Secondary | ICD-10-CM | POA: Diagnosis not present

## 2014-01-02 LAB — CBC
HCT: 38.3 % — ABNORMAL LOW (ref 39.0–52.0)
Hemoglobin: 12.9 g/dL — ABNORMAL LOW (ref 13.0–17.0)
MCH: 32.8 pg (ref 26.0–34.0)
MCHC: 33.7 g/dL (ref 30.0–36.0)
MCV: 97.5 fL (ref 78.0–100.0)
Platelets: 193 10*3/uL (ref 150–400)
RBC: 3.93 MIL/uL — AB (ref 4.22–5.81)
RDW: 13.4 % (ref 11.5–15.5)
WBC: 8.8 10*3/uL (ref 4.0–10.5)

## 2014-01-02 LAB — I-STAT TROPONIN, ED: Troponin i, poc: 0.01 ng/mL (ref 0.00–0.08)

## 2014-01-02 MED ORDER — ACETAMINOPHEN 325 MG PO TABS
650.0000 mg | ORAL_TABLET | Freq: Four times a day (QID) | ORAL | Status: DC | PRN
  Administered 2014-01-03 – 2014-01-05 (×2): 650 mg via ORAL
  Filled 2014-01-02 (×2): qty 2

## 2014-01-02 MED ORDER — ASPIRIN EC 81 MG PO TBEC
81.0000 mg | DELAYED_RELEASE_TABLET | Freq: Every day | ORAL | Status: DC
  Administered 2014-01-03 – 2014-01-05 (×3): 81 mg via ORAL
  Filled 2014-01-02 (×3): qty 1

## 2014-01-02 MED ORDER — OLOPATADINE HCL 0.1 % OP SOLN
1.0000 [drp] | Freq: Two times a day (BID) | OPHTHALMIC | Status: DC
  Administered 2014-01-02 – 2014-01-05 (×6): 1 [drp] via OPHTHALMIC
  Filled 2014-01-02: qty 5

## 2014-01-02 MED ORDER — ACETAMINOPHEN 650 MG RE SUPP: 650.0000 mg | Freq: Four times a day (QID) | RECTAL | Status: DC | PRN

## 2014-01-02 MED ORDER — DONEPEZIL HCL 10 MG PO TABS
10.0000 mg | ORAL_TABLET | Freq: Every day | ORAL | Status: DC
  Administered 2014-01-02 – 2014-01-04 (×3): 10 mg via ORAL
  Filled 2014-01-02 (×4): qty 1

## 2014-01-02 MED ORDER — ASPIRIN 81 MG PO TABS: 81.0000 mg | ORAL_TABLET | Freq: Every day | ORAL | Status: DC

## 2014-01-02 MED ORDER — ALBUTEROL SULFATE (2.5 MG/3ML) 0.083% IN NEBU
2.5000 mg | INHALATION_SOLUTION | RESPIRATORY_TRACT | Status: DC | PRN
Start: 2014-01-02 — End: 2014-01-02

## 2014-01-02 MED ORDER — SODIUM CHLORIDE 0.9 % IR SOLN
80.0000 mg | Status: DC
  Filled 2014-01-02: qty 2

## 2014-01-02 MED ORDER — ONDANSETRON HCL 4 MG/2ML IJ SOLN: 4.0000 mg | Freq: Four times a day (QID) | INTRAMUSCULAR | Status: DC | PRN

## 2014-01-02 MED ORDER — ONDANSETRON HCL 4 MG PO TABS: 4.0000 mg | ORAL_TABLET | Freq: Four times a day (QID) | ORAL | Status: DC | PRN

## 2014-01-02 MED ORDER — SODIUM CHLORIDE 0.9 % IV SOLN
INTRAVENOUS | Status: DC
  Administered 2014-01-02: 18:00:00 via INTRAVENOUS

## 2014-01-02 MED ORDER — VITAMIN D3 25 MCG (1000 UNIT) PO TABS
1000.0000 [IU] | ORAL_TABLET | Freq: Every day | ORAL | Status: DC
  Administered 2014-01-03 – 2014-01-05 (×3): 1000 [IU] via ORAL
  Filled 2014-01-02 (×3): qty 1

## 2014-01-02 MED ORDER — OCUVITE-LUTEIN PO CAPS
1.0000 | ORAL_CAPSULE | Freq: Every day | ORAL | Status: DC
  Administered 2014-01-03 – 2014-01-05 (×3): 1 via ORAL
  Filled 2014-01-02 (×3): qty 1

## 2014-01-02 MED ORDER — CEFAZOLIN SODIUM-DEXTROSE 2-3 GM-% IV SOLR
2.0000 g | INTRAVENOUS | Status: DC
  Filled 2014-01-02: qty 50

## 2014-01-02 MED ORDER — SERTRALINE HCL 25 MG PO TABS: 25.0000 mg | ORAL_TABLET | Freq: Two times a day (BID) | ORAL | Status: DC

## 2014-01-02 MED ORDER — PANTOPRAZOLE SODIUM 40 MG PO TBEC
40.0000 mg | DELAYED_RELEASE_TABLET | Freq: Every day | ORAL | Status: DC
  Administered 2014-01-03 – 2014-01-05 (×3): 40 mg via ORAL
  Filled 2014-01-02 (×3): qty 1

## 2014-01-02 MED ORDER — SODIUM CHLORIDE 0.9 % IV SOLN: INTRAVENOUS | Status: DC

## 2014-01-02 MED ORDER — PRESERVISION AREDS PO TABS: 1.0000 | ORAL_TABLET | Freq: Every day | ORAL | Status: DC

## 2014-01-02 MED ORDER — OXYCODONE HCL 5 MG PO TABS
5.0000 mg | ORAL_TABLET | Freq: Four times a day (QID) | ORAL | Status: DC | PRN
  Filled 2014-01-02: qty 1

## 2014-01-02 MED ORDER — HEPARIN SODIUM (PORCINE) 5000 UNIT/ML IJ SOLN
5000.0000 [IU] | Freq: Three times a day (TID) | INTRAMUSCULAR | Status: DC
Start: 2014-01-02 — End: 2014-01-05
  Administered 2014-01-02 – 2014-01-04 (×2): 5000 [IU] via SUBCUTANEOUS
  Filled 2014-01-02 (×10): qty 1

## 2014-01-02 MED ORDER — MIRABEGRON ER 50 MG PO TB24
50.0000 mg | ORAL_TABLET | Freq: Every day | ORAL | Status: DC
  Administered 2014-01-02 – 2014-01-04 (×3): 50 mg via ORAL
  Filled 2014-01-02 (×4): qty 1

## 2014-01-02 MED ORDER — MEMANTINE HCL 10 MG PO TABS
10.0000 mg | ORAL_TABLET | Freq: Two times a day (BID) | ORAL | Status: DC
  Administered 2014-01-02 – 2014-01-05 (×6): 10 mg via ORAL
  Filled 2014-01-02 (×7): qty 1

## 2014-01-02 MED ORDER — FLUTICASONE PROPIONATE 50 MCG/ACT NA SUSP
2.0000 | Freq: Every day | NASAL | Status: DC
  Administered 2014-01-03 – 2014-01-05 (×3): 2 via NASAL
  Filled 2014-01-02: qty 16

## 2014-01-02 MED ORDER — SODIUM CHLORIDE 0.9 % IJ SOLN
3.0000 mL | Freq: Two times a day (BID) | INTRAMUSCULAR | Status: DC
  Administered 2014-01-02 – 2014-01-05 (×5): 3 mL via INTRAVENOUS

## 2014-01-02 MED ORDER — SERTRALINE HCL 25 MG PO TABS
25.0000 mg | ORAL_TABLET | Freq: Every day | ORAL | Status: DC
  Administered 2014-01-03 – 2014-01-05 (×3): 25 mg via ORAL
  Filled 2014-01-02 (×4): qty 1

## 2014-01-02 MED ORDER — SERTRALINE HCL 50 MG PO TABS
50.0000 mg | ORAL_TABLET | Freq: Every day | ORAL | Status: DC
  Administered 2014-01-02 – 2014-01-04 (×3): 50 mg via ORAL
  Filled 2014-01-02 (×4): qty 1

## 2014-01-02 NOTE — ED Notes (Signed)
Attempted report to [facility] at [facility], RN unavailable and will call back. Family and patient updated.

## 2014-01-02 NOTE — Progress Notes (Signed)
Clinical Social Work Department BRIEF PSYCHOSOCIAL ASSESSMENT 01/02/2014  Patient:  Daniel [name], Daniel [name]     Account Number:  [id number]     Admit date:  01/02/2014  Clinical Social Worker:  Luretha Rued  Date/Time:  01/02/2014 05:15 PM  Referred by:  CSW  Date Referred:  01/02/2014  Other Referral:   Interview type:  Patient Other interview type:   Family at bedside    PSYCHOSOCIAL DATA Living Status:  FAMILY Admitted from facility:   Level of care:   Primary support name:  Mateo Flow fitzgerald Primary support relationship to patient:  CHILD, ADULT Degree of support available:   High level of support as evidence by the family at bedside and care for the patient at home.    CURRENT CONCERNS  Other Concerns:    SOCIAL WORK ASSESSMENT / PLAN CSW met with patient and family at bedside to complete this assessment. Patient present as alert, oriented x3, calm, and cooperative.  Patient and family reports at this time they have not decided on whether or not they would like a SNF or [facility] services post discharge.  CSW provided the patient and family with a [facility] SNF list and offered to inform RNCM about potential information needed for [facility]. The family requested that the [facility] meet with them tomorrow over at [facility] if possible.  CSW infomed Amy RNCM of the request for [facility] information.   Assessment/plan status:  Psychosocial Support/Ongoing Assessment of Needs Other assessment/ plan:   Information/referral to community resources:   SNF    PATIENT'S/FAMILY'S RESPONSE TO PLAN OF CARE: Patient and family shared their appreiciation with the support of the social work department.       Chesley Noon, MSW, [place], 01/02/2014 Evening Clinical Social Worker [phone]

## 2014-01-02 NOTE — ED Notes (Addendum)
Pt fell yesterday going to restroom, not sure why pt fell, pt has dementia. Pt normally walks 2 miles a day. C/o left ankle swelling, right knee, left shoulder, and left rib cage pain. Pt has trouble walking on left ankle now. Pt does not normally use cane.

## 2014-01-02 NOTE — ED Notes (Signed)
Admitting MD at bedside, patient to be transported to [facility] for Pacemaker

## 2014-01-02 NOTE — ED Notes (Signed)
Patient currently in xray at this time 

## 2014-01-02 NOTE — H&P (Signed)
[facility] History and Physical  Daniel [name] GHW:[id number] DOB: 1921-03-18 DOA: 01/02/2014  Referring physician: Dr. Tamera Punt PCP: Leandrew Koyanagi, MD   Chief Complaint: left ankle fracture, brief syncope  HPI: Daniel [name] is a very active 80 y.o. male, with pmh of HTN, HLD, alzheimer dementia, asthma, GERD and depression/anxiety; came to ED after experiencing a fall on 3/24 with brief LOC episode; patient reports having pain on his left foot and right side of his chest (costal area). In Ed patient found to be profoundly bradycardic with 2: 1 second degree AV block on EKG; also with left malleolus fracture and non-displaced 4th right rib fracture. [facility] called to admit patient for further evaluation and treatment.  Cardiology and ortho consulted while in ED, plan is for pace maker placement on 3/26.   Review of Systems:  Negative except as mentioned on HPI.   Past Medical History  Diagnosis Date  . Memory loss   . OCD (obsessive compulsive disorder)   . Dysphagia     Mild  . Gastroesophageal reflux disease   . Degenerative arthritis   . Dyslipidemia   . Hearing loss   . Diverticulitis   . Left bundle branch block   . Nocturnal leg cramps   . Allergy   . Seizures   . Substance abuse   . Ulcer   . Cataract   . Anxiety   . Hypertension   . Hearing difficulty     bilateral hearing aids   Past Surgical History  Procedure Laterality Date  . Tonsillectomy    . Turp vaporization    . Cataract extraction Bilateral   . Basal cell carcinoma excision    . Prostate surgery    . Eye surgery     Social History:  reports that he has quit smoking. He does not have any smokeless tobacco history on file. He reports that he does not drink alcohol or use illicit drugs.  Allergies  Allergen Reactions  . Sanctura [Trospium]     Seizures, body spasms  . Biaxin [Clarithromycin] Hives    Family History  Problem Relation Age of Onset  . Heart attack Mother   . Dementia  Father   . Heart attack Sister   . Heart disease Sister   . Heart attack Brother   . Heart attack Sister   . Heart attack Brother   . Stroke Brother   . Heart disease Daughter   . Heart disease Paternal Grandmother      Prior to Admission medications   Medication Sig Start Date End Date Taking? Authorizing Provider  aspirin 81 MG tablet Take 81 mg by mouth daily.   Yes Historical Provider, MD  cholecalciferol (VITAMIN D) 1000 UNITS tablet Take 1,000 Units by mouth daily.   Yes Historical Provider, MD  donepezil (ARICEPT) 10 MG tablet Take 10 mg by mouth at bedtime.    Yes Historical Provider, MD  fluticasone (FLONASE) 50 MCG/ACT nasal spray Place 2 sprays into the nose daily.   Yes Historical Provider, MD  memantine (NAMENDA) 10 MG tablet Take 1 tablet (10 mg total) by mouth 2 (two) times daily. 08/17/13  Yes Kathrynn Ducking, MD  mirabegron ER (MYRBETRIQ) 50 MG TB24 tablet Take 50 mg by mouth daily.   Yes Historical Provider, MD  Multiple Vitamins-Minerals (PRESERVISION AREDS) TABS Take 1 tablet by mouth daily.   Yes Historical Provider, MD  olopatadine (PATANOL) 0.1 % ophthalmic solution 1 drop 2 (two) times daily.  Yes Historical Provider, MD  omeprazole (PRILOSEC) 20 MG capsule Take 20 mg by mouth every morning.   Yes Historical Provider, MD  sertraline (ZOLOFT) 25 MG tablet Take 25-50 mg by mouth 2 (two) times daily. 25 mg in the morning and 50 mg at night   Yes Historical Provider, MD   Physical Exam: Filed Vitals:   01/02/14 1710  BP: 138/41  Pulse:   Temp:   Resp: 16    BP 138/41  Pulse 60  Temp(Src) 98.3 F (36.8 C) (Oral)  Resp 16  SpO2 99%  General:  Appears calm and comfortable, no fever. Patient AAOX3 Eyes: PERRL, normal lids, irises & conjunctiva ENT: grossly normal hearing, lips & tongue Neck: no LAD, masses or thyromegaly, no JVD Cardiovascular: RRR, no m/r/g. No LE edema. Telemetry and EKG: bradycardic, positive LBB and second degree AV  block Respiratory: CTA bilaterally, no w/r/r. Normal respiratory effort. Abdomen: soft, nt, nd, positive BS Skin: no rash or induration seen on limited exam Musculoskeletal: grossly normal tone BUE/BLE; patient with pain to palpation on his left ankle; also with discomfort while bearing weight. Psychiatric: grossly normal mood and affect, speech fluent and appropriate Neurologic: grossly non-focal.         CBC:  Recent Labs Lab 01/02/14 1445  WBC 8.8  HGB 12.9*  HCT 38.3*  MCV 97.5  PLT 193   Radiological Exams on Admission: Dg Chest 2 View  01/02/2014   CLINICAL DATA:  80 year old who fell.  EXAM: CHEST  2 VIEW  COMPARISON:  DG ABDOMEN ACUTE 2V W/ 1V CHEST dated 08/23/2013; DG CHEST 2 VIEW dated 04/09/2009  FINDINGS: The heart size and mediastinal contours are stable. There are slightly lower lung volumes with mild resulting bibasilar atelectasis. No edema, confluent airspace opacity, pleural effusion or pneumothorax is identified. The bones are demineralized. There is a questionable nondisplaced fracture of the right fourth rib posteriorly. No other acute osseous findings are evident.  IMPRESSION: Possible nondisplaced fracture of the right fourth rib posteriorly-correlate clinically. No pneumothorax or significant pleural effusion.   Electronically Signed   By: Camie Patience M.D.   On: 01/02/2014 14:27   Dg Ankle Complete Left  01/02/2014   CLINICAL DATA:  Left ankle pain and swelling after falling.  EXAM: LEFT ANKLE COMPLETE - 3+ VIEW  COMPARISON:  None.  FINDINGS: The bones are demineralized. There is a vague transverse lucency projecting across the lateral malleolus on the AP and oblique views which could reflect a nondisplaced fracture. There is moderate adjacent lateral soft tissue swelling. No widening of the ankle mortise is identified. The distal tibia and talus are intact. Scattered vascular calcifications are noted.  IMPRESSION: Possible nondisplaced transverse fracture of the  lateral malleolus with adjacent soft tissue swelling.   Electronically Signed   By: Camie Patience M.D.   On: 01/02/2014 14:30   Ct Head Wo Contrast  01/02/2014   CLINICAL DATA:  Fall, head pain, neck pain.  Alzheimer's dementia.  EXAM: CT HEAD WITHOUT CONTRAST  CT CERVICAL SPINE WITHOUT CONTRAST  TECHNIQUE: Multidetector CT imaging of the head and cervical spine was performed following the standard protocol without intravenous contrast. Multiplanar CT image reconstructions of the cervical spine were also generated.  COMPARISON:  None.  FINDINGS: CT HEAD FINDINGS  Advanced atrophy with chronic microvascular ischemic change. No visible acute stroke, hydrocephalus, or extra-axial fluid. No skull fracture or intracranial hemorrhage. Advanced vascular calcification. No visible acute sinus or mastoid fluid.  CT CERVICAL SPINE FINDINGS  There is no visible cervical spine fracture, traumatic subluxation, prevertebral soft tissue swelling, or intraspinal hematoma. Osseous demineralization. Disc space narrowing C5-C6. Degenerative change associated with C1-2 articulation without significant pannus. Moderate facet arthropathy C3-C4 on the right. Carotid atherosclerosis. No pneumothorax.  IMPRESSION: Age related senescent changes. No skull fracture or intracranial hemorrhage.  No cervical spine fracture or traumatic subluxation.   Electronically Signed   By: Rolla Flatten M.D.   On: 01/02/2014 14:10   Ct Cervical Spine Wo Contrast  01/02/2014   CLINICAL DATA:  Fall, head pain, neck pain.  Alzheimer's dementia.  EXAM: CT HEAD WITHOUT CONTRAST  CT CERVICAL SPINE WITHOUT CONTRAST  TECHNIQUE: Multidetector CT imaging of the head and cervical spine was performed following the standard protocol without intravenous contrast. Multiplanar CT image reconstructions of the cervical spine were also generated.  COMPARISON:  None.  FINDINGS: CT HEAD FINDINGS  Advanced atrophy with chronic microvascular ischemic change. No visible acute  stroke, hydrocephalus, or extra-axial fluid. No skull fracture or intracranial hemorrhage. Advanced vascular calcification. No visible acute sinus or mastoid fluid.  CT CERVICAL SPINE FINDINGS  There is no visible cervical spine fracture, traumatic subluxation, prevertebral soft tissue swelling, or intraspinal hematoma. Osseous demineralization. Disc space narrowing C5-C6. Degenerative change associated with C1-2 articulation without significant pannus. Moderate facet arthropathy C3-C4 on the right. Carotid atherosclerosis. No pneumothorax.  IMPRESSION: Age related senescent changes. No skull fracture or intracranial hemorrhage.  No cervical spine fracture or traumatic subluxation.   Electronically Signed   By: Rolla Flatten M.D.   On: 01/02/2014 14:10   Dg Knee Complete 4 Views Right  01/02/2014   CLINICAL DATA:  Knee pain following injury  EXAM: RIGHT KNEE - COMPLETE 4+ VIEW  COMPARISON:  None.  FINDINGS: No acute fracture or dislocation is noted. Mild joint space narrowing is noted medially. No gross soft tissue abnormality is seen.  IMPRESSION: Mild degenerative change without acute abnormality.   Electronically Signed   By: Inez Catalina M.D.   On: 01/02/2014 14:30    EKG:  -SR with 2:1 AV block and old LBBB -no acute ischemic changes  Assessment/Plan 1-Heart block: second degree mobitz Type II -cardiology has been consulted and the plan is for pacemaker on 3/26 -will admit to telemetry -will check TSH, Mg and Po4 -will check 2-D echo   2-Alzheimer's disease: continue namenda, continue aricept (discussed with cardiology and ok to continue); contiue supportive care.  3-BPH (benign prostatic hyperplasia): w/o urinary retention. Currently not taking any meds. Will monitor, especially with use of pain meds  4-left malleolus fracture: ortho consulted and recommending cam walker boot for 3-4 weeks and follow up as an outpatient.  5-GERD (gastroesophageal reflux disease): continue PPI  6-HTN  (hypertension): control with diet. Will monitor. Stable and well controlled currently  7-Other and unspecified hyperlipidemia: not on statins at this point. Controlled with diet as an outpatient  8-allergic rhinitis and Asthma: continue flonase  9-depression/anxiety: continue zoloft. No SI  10-increased ocular pressure: continue home prescriptions for eye drops  DVT: heparin  Ortho (Dr. Mayer Camel) Cardiology (Dr. Sallyanne Kuster)  Code Status: Full Family Communication: Daughters at bedside Disposition Plan: LOS > 2 midnights, inpatient; telemetry   Time spent: 50 minutes  Barton Dubois [facility] Pager [phone]

## 2014-01-02 NOTE — ED Provider Notes (Addendum)
CSN: [id number]     Arrival date & time 01/02/14  1210 History   First MD Initiated Contact with Patient 01/02/14 1259     Chief Complaint  Patient presents with  . Fall     (Consider location/radiation/quality/duration/timing/severity/associated sxs/prior Treatment) HPI Comments: Patient presents after sustaining a fall yesterday. He has a history of dementia but is very functional. He normally walks at least a mile a day. He lives independently in a condo but his daughter lives right now shortened takes care of him. He had a fall yesterday after his cough he came in and was going to the bathroom when his daughter her thigh on the floor he is down on the floor. He's had some falls before with unsteadiness in his hips. It's unclear what lead to his current fall. Daughter says he is at his baseline mental status. He did hit his head and has an abrasion on top of his head. He also has swelling and throbbing constant pain to his left ankle. He has been a little bear weight on it hurts to walk. He hasn't had any facial drooping or slurred speech. He hasn't been complaining of any chest pain or shortness of breath. He hasn't had any fevers or recent illnesses.  Patient is a 80 y.o. male presenting with fall.  Fall Pertinent negatives include no chest pain, no abdominal pain, no headaches and no shortness of breath.    Past Medical History  Diagnosis Date  . Memory loss   . OCD (obsessive compulsive disorder)   . Dysphagia     Mild  . Gastroesophageal reflux disease   . Degenerative arthritis   . Dyslipidemia   . Hearing loss   . Diverticulitis   . Left bundle branch block   . Nocturnal leg cramps   . Allergy   . Seizures   . Substance abuse   . Ulcer   . Cataract   . Anxiety   . Hypertension   . Hearing difficulty     bilateral hearing aids   Past Surgical History  Procedure Laterality Date  . Tonsillectomy    . Turp vaporization    . Cataract extraction Bilateral   . Basal  cell carcinoma excision    . Prostate surgery    . Eye surgery     Family History  Problem Relation Age of Onset  . Heart attack Mother   . Dementia Father   . Heart attack Sister   . Heart disease Sister   . Heart attack Brother   . Heart attack Sister   . Heart attack Brother   . Stroke Brother   . Heart disease Daughter   . Heart disease Paternal Grandmother    History  Substance Use Topics  . Smoking status: Former Research scientist (life sciences)  . Smokeless tobacco: Not on file  . Alcohol Use: No     Comment: History of alcoholism, no longer drinking    Review of Systems  Constitutional: Negative for fever, chills, diaphoresis and fatigue.  HENT: Negative for congestion, rhinorrhea and sneezing.   Eyes: Negative.   Respiratory: Negative for cough, chest tightness and shortness of breath.   Cardiovascular: Negative for chest pain and leg swelling.  Gastrointestinal: Negative for nausea, vomiting, abdominal pain, diarrhea and blood in stool.  Genitourinary: Negative for frequency, hematuria, flank pain and difficulty urinating.  Musculoskeletal: Positive for arthralgias. Negative for back pain.  Skin: Negative for rash.  Neurological: Negative for dizziness, speech difficulty, weakness, numbness and headaches.  Allergies  Sanctura and Biaxin  Home Medications   Current Outpatient Rx  Name  Route  Sig  Dispense  Refill  . aspirin 81 MG tablet   Oral   Take 81 mg by mouth daily.         . cholecalciferol (VITAMIN D) 1000 UNITS tablet   Oral   Take 1,000 Units by mouth daily.         Marland Kitchen donepezil (ARICEPT) 10 MG tablet   Oral   Take 10 mg by mouth at bedtime.          . fluticasone (FLONASE) 50 MCG/ACT nasal spray   Nasal   Place 2 sprays into the nose daily.         . memantine (NAMENDA) 10 MG tablet   Oral   Take 1 tablet (10 mg total) by mouth 2 (two) times daily.   180 tablet   1   . mirabegron ER (MYRBETRIQ) 50 MG TB24 tablet   Oral   Take 50 mg by mouth  daily.         . Multiple Vitamins-Minerals (PRESERVISION AREDS) TABS   Oral   Take 1 tablet by mouth daily.         Marland Kitchen olopatadine (PATANOL) 0.1 % ophthalmic solution      1 drop 2 (two) times daily.         Marland Kitchen omeprazole (PRILOSEC) 20 MG capsule   Oral   Take 20 mg by mouth every morning.         . sertraline (ZOLOFT) 25 MG tablet   Oral   Take 25-50 mg by mouth 2 (two) times daily. 25 mg in the morning and 50 mg at night          BP 135/77  Pulse 60  Temp(Src) 98.3 F (36.8 C) (Oral)  Resp 16  SpO2 97% Physical Exam  Constitutional: He appears well-developed and well-nourished.  HENT:  Head: Normocephalic and atraumatic.  Eyes: Pupils are equal, round, and reactive to light.  Neck:  Patient has been complaining of neck pain but I don't palpate any tenderness along the spine.  Cardiovascular: Normal rate, regular rhythm and normal heart sounds.   Pulmonary/Chest: Effort normal and breath sounds normal. No respiratory distress. He has no wheezes. He has no rales. He exhibits tenderness (slight TTP mid left chest wall.  no crepitus or deformity).  Abdominal: Soft. Bowel sounds are normal. There is no tenderness. There is no rebound and no guarding.  Musculoskeletal: Normal range of motion. He exhibits no edema.  He has some slight pain in the medial aspect of his right knee. He has pain and swelling to his left ankle. He is neurovascularly intact distally. In both lower extremities. He has no pain on palpation or range of motion of the hips. He has no pain on palpation or range of motion of the shoulders or elbows. I don't find any other evident injuries.  Lymphadenopathy:    He has no cervical adenopathy.  Neurological: He is alert.  He is oriented times but confused at times. He talks recent meds are clear with no focal deficits.  Skin: Skin is warm and dry. No rash noted.  Psychiatric: He has a normal mood and affect.    ED Course  Procedures (including  critical care time) Labs Review Labs Reviewed  CBC  I-STAT CHEM 8, ED  Randolm Idol, ED   Imaging Review Dg Chest 2 View  01/02/2014   CLINICAL DATA:  80 year old who fell.  EXAM: CHEST  2 VIEW  COMPARISON:  DG ABDOMEN ACUTE 2V W/ 1V CHEST dated 08/23/2013; DG CHEST 2 VIEW dated 04/09/2009  FINDINGS: The heart size and mediastinal contours are stable. There are slightly lower lung volumes with mild resulting bibasilar atelectasis. No edema, confluent airspace opacity, pleural effusion or pneumothorax is identified. The bones are demineralized. There is a questionable nondisplaced fracture of the right fourth rib posteriorly. No other acute osseous findings are evident.  IMPRESSION: Possible nondisplaced fracture of the right fourth rib posteriorly-correlate clinically. No pneumothorax or significant pleural effusion.   Electronically Signed   By: Camie Patience M.D.   On: 01/02/2014 14:27   Dg Ankle Complete Left  01/02/2014   CLINICAL DATA:  Left ankle pain and swelling after falling.  EXAM: LEFT ANKLE COMPLETE - 3+ VIEW  COMPARISON:  None.  FINDINGS: The bones are demineralized. There is a vague transverse lucency projecting across the lateral malleolus on the AP and oblique views which could reflect a nondisplaced fracture. There is moderate adjacent lateral soft tissue swelling. No widening of the ankle mortise is identified. The distal tibia and talus are intact. Scattered vascular calcifications are noted.  IMPRESSION: Possible nondisplaced transverse fracture of the lateral malleolus with adjacent soft tissue swelling.   Electronically Signed   By: Camie Patience M.D.   On: 01/02/2014 14:30   Ct Head Wo Contrast  01/02/2014   CLINICAL DATA:  Fall, head pain, neck pain.  Alzheimer's dementia.  EXAM: CT HEAD WITHOUT CONTRAST  CT CERVICAL SPINE WITHOUT CONTRAST  TECHNIQUE: Multidetector CT imaging of the head and cervical spine was performed following the standard protocol without intravenous  contrast. Multiplanar CT image reconstructions of the cervical spine were also generated.  COMPARISON:  None.  FINDINGS: CT HEAD FINDINGS  Advanced atrophy with chronic microvascular ischemic change. No visible acute stroke, hydrocephalus, or extra-axial fluid. No skull fracture or intracranial hemorrhage. Advanced vascular calcification. No visible acute sinus or mastoid fluid.  CT CERVICAL SPINE FINDINGS  There is no visible cervical spine fracture, traumatic subluxation, prevertebral soft tissue swelling, or intraspinal hematoma. Osseous demineralization. Disc space narrowing C5-C6. Degenerative change associated with C1-2 articulation without significant pannus. Moderate facet arthropathy C3-C4 on the right. Carotid atherosclerosis. No pneumothorax.  IMPRESSION: Age related senescent changes. No skull fracture or intracranial hemorrhage.  No cervical spine fracture or traumatic subluxation.   Electronically Signed   By: Rolla Flatten M.D.   On: 01/02/2014 14:10   Ct Cervical Spine Wo Contrast  01/02/2014   CLINICAL DATA:  Fall, head pain, neck pain.  Alzheimer's dementia.  EXAM: CT HEAD WITHOUT CONTRAST  CT CERVICAL SPINE WITHOUT CONTRAST  TECHNIQUE: Multidetector CT imaging of the head and cervical spine was performed following the standard protocol without intravenous contrast. Multiplanar CT image reconstructions of the cervical spine were also generated.  COMPARISON:  None.  FINDINGS: CT HEAD FINDINGS  Advanced atrophy with chronic microvascular ischemic change. No visible acute stroke, hydrocephalus, or extra-axial fluid. No skull fracture or intracranial hemorrhage. Advanced vascular calcification. No visible acute sinus or mastoid fluid.  CT CERVICAL SPINE FINDINGS  There is no visible cervical spine fracture, traumatic subluxation, prevertebral soft tissue swelling, or intraspinal hematoma. Osseous demineralization. Disc space narrowing C5-C6. Degenerative change associated with C1-2 articulation  without significant pannus. Moderate facet arthropathy C3-C4 on the right. Carotid atherosclerosis. No pneumothorax.  IMPRESSION: Age related senescent changes. No skull fracture or intracranial hemorrhage.  No cervical spine fracture or traumatic  subluxation.   Electronically Signed   By: Rolla Flatten M.D.   On: 01/02/2014 14:10   Dg Knee Complete 4 Views Right  01/02/2014   CLINICAL DATA:  Knee pain following injury  EXAM: RIGHT KNEE - COMPLETE 4+ VIEW  COMPARISON:  None.  FINDINGS: No acute fracture or dislocation is noted. Mild joint space narrowing is noted medially. No gross soft tissue abnormality is seen.  IMPRESSION: Mild degenerative change without acute abnormality.   Electronically Signed   By: Inez Catalina M.D.   On: 01/02/2014 14:30     EKG Interpretation   Date/Time:  Wednesday January 02 2014 14:17:58 EDT Ventricular Rate:  33 PR Interval:  218 QRS Duration: 148 QT Interval:  484 QTC Calculation: 358 R Axis:   -46 Text Interpretation:  Predominant 2:1 AV block Left bundle branch block  Baseline wander in lead(s) V1 V6 Mobitz II 2-degree AV block changed from  prior EKG Confirmed by Alisan Dokes  MD, Jasminemarie Sherrard ([id number]) on 01/02/2014 2:25:48 PM      MDM   Final diagnoses:  Heart block  Fall    Patient presents after a fall that happened yesterday. It's unclear what lead to the fall or if he had a syncopal episode. He did have an episode in the ED of the heart rate dropped to 33 E. and was in a Mobitz type II heart block. He then converted back to sinus rhythm. However this could be what lead to the fall. A consult to cardiology who is going to see the patient. They were reluctant to admit the patients so I will contact the hospitalist for admission. He does have what appears to be a nondisplaced fracture lateral malleolus of the left ankle. He was placed in an ankle splint for this. He also has a possible rib fracture but he did not have any significant pain on exam.  I contacted the  hospitalist who will admit the pt.  He requests that I notify ortho about ankle fracture.  I spoke with Dr. Damita Dunnings secretary with ortho who will let Dr. Mayer Camel know about the patient. 15:53:  I did speak with Dr. Mayer Camel who will see pt.  Wants Cam walker.  Malvin Johns, MD 01/02/14 1451  Malvin Johns, MD 01/02/14 [place], MD 01/02/14 [phone]

## 2014-01-02 NOTE — Consult Note (Signed)
Reason for Consult: Possible syncope  Requesting Physician: [place]  Cardiologist: None  HPI: This is a 80 y.o. male with a past medical history significant for excellent health, with recent fatigue, exertional dyspnea and possible syncope. Usually walks 1 mile in 17 minutes, twice daily. Recently breathless doing it. Fell at home and suspected to have briefly lost consciousness yesterday.His memory is poor and his daughter provides a lot of the history. Despite dementia, he lives in his own home and she checks on him frequently. Physically, he is very functional and independent.  He had "arrhythmia" 40 years ago and took atenolol. This was stopped years ago. No history of CHF or angina.  PMHx:  Past Medical History  Diagnosis Date  . Memory loss   . OCD (obsessive compulsive disorder)   . Dysphagia     Mild  . Gastroesophageal reflux disease   . Degenerative arthritis   . Dyslipidemia   . Hearing loss   . Diverticulitis   . Left bundle branch block   . Nocturnal leg cramps   . Allergy   . Seizures   . Substance abuse   . Ulcer   . Cataract   . Anxiety   . Hypertension   . Hearing difficulty     bilateral hearing aids   Past Surgical History  Procedure Laterality Date  . Tonsillectomy    . Turp vaporization    . Cataract extraction Bilateral   . Basal cell carcinoma excision    . Prostate surgery    . Eye surgery      FAMHx: Family History  Problem Relation Age of Onset  . Heart attack Mother   . Dementia Father   . Heart attack Sister   . Heart disease Sister   . Heart attack Brother   . Heart attack Sister   . Heart attack Brother   . Stroke Brother   . Heart disease Daughter   . Heart disease Paternal Grandmother     SOCHx:  reports that he has quit smoking. He does not have any smokeless tobacco history on file. He reports that he does not drink alcohol or use illicit drugs.  ALLERGIES: Allergies  Allergen Reactions  . Sanctura  [Trospium]     Seizures, body spasms  . Biaxin [Clarithromycin] Hives    ROS: Constitutional: positive for fatigue, negative for anorexia, chills, fevers, night sweats and weight loss Eyes: negative Ears, nose, mouth, throat, and face: positive for hearing loss Respiratory: positive for dyspnea on exertion, negative for cough, hemoptysis, pleurisy/chest pain, sputum and wheezing Cardiovascular: positive for dyspnea Gastrointestinal: negative for change in bowel habits, dysphagia, melena, nausea, reflux symptoms and vomiting Genitourinary:negative Hematologic/lymphatic: negative for bleeding, easy bruising and petechiae Musculoskeletal:positive for rib pain after fall Neurological: positive for memory problems Behavioral/Psych: positive for "OCD" Endocrine: negative  HOME MEDICATIONS:  (Not in a hospital admission)  HOSPITAL MEDICATIONS: Prior to Admission:  (Not in a hospital admission)  VITALS: Blood pressure 135/77, pulse 60, temperature 98.3 F (36.8 C), temperature source Oral, resp. rate 16, SpO2 97.00%.  PHYSICAL EXAM:  General: Alert, oriented x3, no distress Head: no evidence of trauma, PERRL, EOMI, no exophtalmos or lid lag, no myxedema, no xanthelasma; normal ears, nose and oropharynx Neck: normal jugular venous pulsations and no hepatojugular reflux; brisk carotid pulses without delay and no carotid bruits Chest: clear to auscultation, no signs of consolidation by percussion or palpation, normal fremitus, symmetrical and full respiratory excursions Cardiovascular: normal position and  quality of the apical impulse, regular rhythm, normal first heart sound and paradoxically split second heart sound, no rubs or gallops, no murmur Abdomen: no tenderness or distention, no masses by palpation, no abnormal pulsatility or arterial bruits, normal bowel sounds, no hepatosplenomegaly Extremities: no clubbing, cyanosis;  no edema; 2+ radial, ulnar and brachial pulses  bilaterally; 2+ right femoral, posterior tibial and dorsalis pedis pulses; 2+ left femoral, posterior tibial and dorsalis pedis pulses; no subclavian or femoral bruits Neurological: grossly nonfocal   LABS  CBC  Recent Labs  01/02/14 1445  WBC 8.8  HGB 12.9*  HCT 38.3*  MCV 97.5  PLT 193   Basic Metabolic Panel No results found for this basename: NA, K, CL, CO2, GLUCOSE, BUN, CREATININE, CALCIUM, MG, PHOS,  in the last 72 hours Liver Function Tests No results found for this basename: AST, ALT, ALKPHOS, BILITOT, PROT, ALBUMIN,  in the last 72 hours No results found for this basename: LIPASE, AMYLASE,  in the last 72 hours Cardiac Enzymes No results found for this basename: CKTOTAL, CKMB, CKMBINDEX, TROPONINI,  in the last 72 hours BNP No components found with this basename: POCBNP,  D-Dimer No results found for this basename: DDIMER,  in the last 72 hours Hemoglobin A1C No results found for this basename: HGBA1C,  in the last 72 hours Fasting Lipid Panel No results found for this basename: CHOL, HDL, LDLCALC, TRIG, CHOLHDL, LDLDIRECT,  in the last 72 hours Thyroid Function Tests No results found for this basename: TSH, T4TOTAL, FREET3, T3FREE, THYROIDAB,  in the last 72 hours    IMAGING: Dg Chest 2 View  01/02/2014   CLINICAL DATA:  80 year old who fell.  EXAM: CHEST  2 VIEW  COMPARISON:  DG ABDOMEN ACUTE 2V W/ 1V CHEST dated 08/23/2013; DG CHEST 2 VIEW dated 04/09/2009  FINDINGS: The heart size and mediastinal contours are stable. There are slightly lower lung volumes with mild resulting bibasilar atelectasis. No edema, confluent airspace opacity, pleural effusion or pneumothorax is identified. The bones are demineralized. There is a questionable nondisplaced fracture of the right fourth rib posteriorly. No other acute osseous findings are evident.  IMPRESSION: Possible nondisplaced fracture of the right fourth rib posteriorly-correlate clinically. No pneumothorax or significant  pleural effusion.   Electronically Signed   By: Camie Patience M.D.   On: 01/02/2014 14:27   Dg Ankle Complete Left  01/02/2014   CLINICAL DATA:  Left ankle pain and swelling after falling.  EXAM: LEFT ANKLE COMPLETE - 3+ VIEW  COMPARISON:  None.  FINDINGS: The bones are demineralized. There is a vague transverse lucency projecting across the lateral malleolus on the AP and oblique views which could reflect a nondisplaced fracture. There is moderate adjacent lateral soft tissue swelling. No widening of the ankle mortise is identified. The distal tibia and talus are intact. Scattered vascular calcifications are noted.  IMPRESSION: Possible nondisplaced transverse fracture of the lateral malleolus with adjacent soft tissue swelling.   Electronically Signed   By: Camie Patience M.D.   On: 01/02/2014 14:30   Ct Head Wo Contrast  01/02/2014   CLINICAL DATA:  Fall, head pain, neck pain.  Alzheimer's dementia.  EXAM: CT HEAD WITHOUT CONTRAST  CT CERVICAL SPINE WITHOUT CONTRAST  TECHNIQUE: Multidetector CT imaging of the head and cervical spine was performed following the standard protocol without intravenous contrast. Multiplanar CT image reconstructions of the cervical spine were also generated.  COMPARISON:  None.  FINDINGS: CT HEAD FINDINGS  Advanced atrophy with chronic microvascular  ischemic change. No visible acute stroke, hydrocephalus, or extra-axial fluid. No skull fracture or intracranial hemorrhage. Advanced vascular calcification. No visible acute sinus or mastoid fluid.  CT CERVICAL SPINE FINDINGS  There is no visible cervical spine fracture, traumatic subluxation, prevertebral soft tissue swelling, or intraspinal hematoma. Osseous demineralization. Disc space narrowing C5-C6. Degenerative change associated with C1-2 articulation without significant pannus. Moderate facet arthropathy C3-C4 on the right. Carotid atherosclerosis. No pneumothorax.  IMPRESSION: Age related senescent changes. No skull fracture or  intracranial hemorrhage.  No cervical spine fracture or traumatic subluxation.   Electronically Signed   By: Rolla Flatten M.D.   On: 01/02/2014 14:10   Ct Cervical Spine Wo Contrast  01/02/2014   CLINICAL DATA:  Fall, head pain, neck pain.  Alzheimer's dementia.  EXAM: CT HEAD WITHOUT CONTRAST  CT CERVICAL SPINE WITHOUT CONTRAST  TECHNIQUE: Multidetector CT imaging of the head and cervical spine was performed following the standard protocol without intravenous contrast. Multiplanar CT image reconstructions of the cervical spine were also generated.  COMPARISON:  None.  FINDINGS: CT HEAD FINDINGS  Advanced atrophy with chronic microvascular ischemic change. No visible acute stroke, hydrocephalus, or extra-axial fluid. No skull fracture or intracranial hemorrhage. Advanced vascular calcification. No visible acute sinus or mastoid fluid.  CT CERVICAL SPINE FINDINGS  There is no visible cervical spine fracture, traumatic subluxation, prevertebral soft tissue swelling, or intraspinal hematoma. Osseous demineralization. Disc space narrowing C5-C6. Degenerative change associated with C1-2 articulation without significant pannus. Moderate facet arthropathy C3-C4 on the right. Carotid atherosclerosis. No pneumothorax.  IMPRESSION: Age related senescent changes. No skull fracture or intracranial hemorrhage.  No cervical spine fracture or traumatic subluxation.   Electronically Signed   By: Rolla Flatten M.D.   On: 01/02/2014 14:10   Dg Knee Complete 4 Views Right  01/02/2014   CLINICAL DATA:  Knee pain following injury  EXAM: RIGHT KNEE - COMPLETE 4+ VIEW  COMPARISON:  None.  FINDINGS: No acute fracture or dislocation is noted. Mild joint space narrowing is noted medially. No gross soft tissue abnormality is seen.  IMPRESSION: Mild degenerative change without acute abnormality.   Electronically Signed   By: Inez Catalina M.D.   On: 01/02/2014 14:30    ECG: SR with 2:1 AV block and old LBBB  TELEMETRY: SR,  intermittent 2:1 AV block, heart  rate 30 bpm  IMPRESSION: 1. Symptomatic Mobitz type 2 second degree AV block . Whether or not he truly had syncope, he meets criteria for device therapy and will benefit as far as his fatigue and dyspnea.  RECOMMENDATION: 1. Dual chamber pacemaker implantation, will arrange for tomorrow [facility], so it is preferrable that he be admitted there  Time Spent Directly with Patient: 60 minutes  Sanda Klein, MD, [facility] [phone] office [phone] pager   01/02/2014, 4:34 PM

## 2014-01-02 NOTE — ED Notes (Addendum)
RN EMTALA complete except arrival time and date of transport service, Dr. Dyann Kief aware and Mickel Baas RN aware. Report was called to [facility] and now awaiting transport via [facility]. Family at bedside, both patient and family updated on status.

## 2014-01-02 NOTE — Consult Note (Signed)
Reason for Consult: Left ankle lateral malleolus fracture Referring Physician: Dr. Judd Gaudier [name] is an 80 y.o. male.  HPI: Household ambulator lost consciousness and fell sustaining injury to his left ankle and possibly a rib fracture. X-rays were reddish showing a possible nondisplaced fracture he has pain in the left ankle although he was able to walk with a limp after he slipped and fell. He is being admitted for cardiac workup with a heart block. He denies any other injuries to his extremities. He reports he is quite ticklish to his feet.  Past Medical History  Diagnosis Date  . Memory loss   . OCD (obsessive compulsive disorder)   . Dysphagia     Mild  . Gastroesophageal reflux disease   . Degenerative arthritis   . Dyslipidemia   . Hearing loss   . Diverticulitis   . Left bundle branch block   . Nocturnal leg cramps   . Allergy   . Seizures   . Substance abuse   . Ulcer   . Cataract   . Anxiety   . Hypertension   . Hearing difficulty     bilateral hearing aids    Past Surgical History  Procedure Laterality Date  . Tonsillectomy    . Turp vaporization    . Cataract extraction Bilateral   . Basal cell carcinoma excision    . Prostate surgery    . Eye surgery      Family History  Problem Relation Age of Onset  . Heart attack Mother   . Dementia Father   . Heart attack Sister   . Heart disease Sister   . Heart attack Brother   . Heart attack Sister   . Heart attack Brother   . Stroke Brother   . Heart disease Daughter   . Heart disease Paternal Grandmother     Social History:  reports that he has quit smoking. He does not have any smokeless tobacco history on file. He reports that he does not drink alcohol or use illicit drugs.  Allergies:  Allergies  Allergen Reactions  . Sanctura [Trospium]     Seizures, body spasms  . Biaxin [Clarithromycin] Hives    Medications: I have reviewed the patient's current medications.  Results for orders  placed during the hospital encounter of 01/02/14 (from the past 48 hour(s))  CBC     Status: Abnormal   Collection Time    01/02/14  2:45 PM      Result Value Ref Range   WBC 8.8  4.0 - 10.5 K/uL   RBC 3.93 (*) 4.22 - 5.81 MIL/uL   Hemoglobin 12.9 (*) 13.0 - 17.0 g/dL   HCT 38.3 (*) 39.0 - 52.0 %   MCV 97.5  78.0 - 100.0 fL   MCH 32.8  26.0 - 34.0 pg   MCHC 33.7  30.0 - 36.0 g/dL   RDW 13.4  11.5 - 15.5 %   Platelets 193  150 - 400 K/uL  I-STAT TROPOININ, ED     Status: None   Collection Time    01/02/14  3:00 PM      Result Value Ref Range   Troponin i, poc 0.01  0.00 - 0.08 ng/mL   Comment 3            Comment: Due to the release kinetics of cTnI,     a negative result within the first hours     of the onset of symptoms does not rule out  myocardial infarction with certainty.     If myocardial infarction is still suspected,     repeat the test at appropriate intervals.    Dg Chest 2 View  01/02/2014   CLINICAL DATA:  80 year old who fell.  EXAM: CHEST  2 VIEW  COMPARISON:  DG ABDOMEN ACUTE 2V W/ 1V CHEST dated 08/23/2013; DG CHEST 2 VIEW dated 04/09/2009  FINDINGS: The heart size and mediastinal contours are stable. There are slightly lower lung volumes with mild resulting bibasilar atelectasis. No edema, confluent airspace opacity, pleural effusion or pneumothorax is identified. The bones are demineralized. There is a questionable nondisplaced fracture of the right fourth rib posteriorly. No other acute osseous findings are evident.  IMPRESSION: Possible nondisplaced fracture of the right fourth rib posteriorly-correlate clinically. No pneumothorax or significant pleural effusion.   Electronically Signed   By: Camie Patience M.D.   On: 01/02/2014 14:27   Dg Ankle Complete Left  01/02/2014   CLINICAL DATA:  Left ankle pain and swelling after falling.  EXAM: LEFT ANKLE COMPLETE - 3+ VIEW  COMPARISON:  None.  FINDINGS: The bones are demineralized. There is a vague transverse lucency  projecting across the lateral malleolus on the AP and oblique views which could reflect a nondisplaced fracture. There is moderate adjacent lateral soft tissue swelling. No widening of the ankle mortise is identified. The distal tibia and talus are intact. Scattered vascular calcifications are noted.  IMPRESSION: Possible nondisplaced transverse fracture of the lateral malleolus with adjacent soft tissue swelling.   Electronically Signed   By: Camie Patience M.D.   On: 01/02/2014 14:30   Ct Head Wo Contrast  01/02/2014   CLINICAL DATA:  Fall, head pain, neck pain.  Alzheimer's dementia.  EXAM: CT HEAD WITHOUT CONTRAST  CT CERVICAL SPINE WITHOUT CONTRAST  TECHNIQUE: Multidetector CT imaging of the head and cervical spine was performed following the standard protocol without intravenous contrast. Multiplanar CT image reconstructions of the cervical spine were also generated.  COMPARISON:  None.  FINDINGS: CT HEAD FINDINGS  Advanced atrophy with chronic microvascular ischemic change. No visible acute stroke, hydrocephalus, or extra-axial fluid. No skull fracture or intracranial hemorrhage. Advanced vascular calcification. No visible acute sinus or mastoid fluid.  CT CERVICAL SPINE FINDINGS  There is no visible cervical spine fracture, traumatic subluxation, prevertebral soft tissue swelling, or intraspinal hematoma. Osseous demineralization. Disc space narrowing C5-C6. Degenerative change associated with C1-2 articulation without significant pannus. Moderate facet arthropathy C3-C4 on the right. Carotid atherosclerosis. No pneumothorax.  IMPRESSION: Age related senescent changes. No skull fracture or intracranial hemorrhage.  No cervical spine fracture or traumatic subluxation.   Electronically Signed   By: Rolla Flatten M.D.   On: 01/02/2014 14:10   Ct Cervical Spine Wo Contrast  01/02/2014   CLINICAL DATA:  Fall, head pain, neck pain.  Alzheimer's dementia.  EXAM: CT HEAD WITHOUT CONTRAST  CT CERVICAL SPINE  WITHOUT CONTRAST  TECHNIQUE: Multidetector CT imaging of the head and cervical spine was performed following the standard protocol without intravenous contrast. Multiplanar CT image reconstructions of the cervical spine were also generated.  COMPARISON:  None.  FINDINGS: CT HEAD FINDINGS  Advanced atrophy with chronic microvascular ischemic change. No visible acute stroke, hydrocephalus, or extra-axial fluid. No skull fracture or intracranial hemorrhage. Advanced vascular calcification. No visible acute sinus or mastoid fluid.  CT CERVICAL SPINE FINDINGS  There is no visible cervical spine fracture, traumatic subluxation, prevertebral soft tissue swelling, or intraspinal hematoma. Osseous demineralization. Disc space narrowing C5-C6. Degenerative  change associated with C1-2 articulation without significant pannus. Moderate facet arthropathy C3-C4 on the right. Carotid atherosclerosis. No pneumothorax.  IMPRESSION: Age related senescent changes. No skull fracture or intracranial hemorrhage.  No cervical spine fracture or traumatic subluxation.   Electronically Signed   By: Rolla Flatten M.D.   On: 01/02/2014 14:10   Dg Knee Complete 4 Views Right  01/02/2014   CLINICAL DATA:  Knee pain following injury  EXAM: RIGHT KNEE - COMPLETE 4+ VIEW  COMPARISON:  None.  FINDINGS: No acute fracture or dislocation is noted. Mild joint space narrowing is noted medially. No gross soft tissue abnormality is seen.  IMPRESSION: Mild degenerative change without acute abnormality.   Electronically Signed   By: Inez Catalina M.D.   On: 01/02/2014 14:30    ROS Blood pressure 135/77, pulse 60, temperature 98.3 F (36.8 C), temperature source Oral, resp. rate 16, SpO2 97.00%. Physical Exam: The patient is tender over the lateral malleolus less so over the distal ligaments. Full range of motion of the ankle 1+ swelling over the lateral malleolus region tib-fib compression test is negative. I personally examined the x-rays and he may  have a nondisplaced fracture halfway between the tip of the malleolus and the plafond. He has normal sensation of his feet and in no cuts or abrasions. He is nontender to palpation of the knee and has a good range of motion  Assessment/Plan: Assessment: Probable nondisplaced lateral malleolus fracture left ankle with 1+ swelling no pain when you actually load his foot with weightbearing.  Plan: The patient is being admitted for workup of his heart block and loss of consciousness. I've ordered a Cam Walker boot and he is weightbearing as tolerated when he is cleared for ambulation out of bed by medicine and cardiology. Hybrid for a physical therapy consult to instruct on use of a walker. I would like to see him back in my office in one to 2 weeks for followup examination and x-rays.  Frederik Pear J 01/02/2014, 5:04 PM

## 2014-01-02 NOTE — ED Notes (Signed)
Report given to [facility] from [facility].

## 2014-01-02 NOTE — ED Notes (Signed)
Patient returned from xray.

## 2014-01-03 ENCOUNTER — Encounter (HOSPITAL_COMMUNITY)
Admission: EM | Disposition: A | Discharge status: Skilled Nursing Facility | Payer: Medicare Other | Source: Home / Self Care | Attending: Internal Medicine

## 2014-01-03 ENCOUNTER — Encounter (HOSPITAL_COMMUNITY): Payer: Self-pay | Admitting: General Practice

## 2014-01-03 DIAGNOSIS — I441 Atrioventricular block, second degree: Secondary | ICD-10-CM

## 2014-01-03 DIAGNOSIS — E785 Hyperlipidemia, unspecified: Secondary | ICD-10-CM

## 2014-01-03 HISTORY — PX: PERMANENT PACEMAKER INSERTION: SHX5480

## 2014-01-03 LAB — CREATININE, SERUM
CREATININE: 1.19 mg/dL (ref 0.50–1.35)
GFR calc Af Amer: 60 mL/min — ABNORMAL LOW (ref >90)
GFR calc non Af Amer: 52 mL/min — ABNORMAL LOW (ref >90)

## 2014-01-03 LAB — TSH: TSH: 0.747 u[IU]/mL (ref 0.350–4.500)

## 2014-01-03 LAB — URINALYSIS, ROUTINE W REFLEX MICROSCOPIC
Bilirubin Urine: NEGATIVE
Glucose, UA: NEGATIVE mg/dL
Hgb urine dipstick: NEGATIVE
Ketones, ur: NEGATIVE mg/dL
Leukocytes, UA: NEGATIVE
NITRITE: NEGATIVE
Protein, ur: NEGATIVE mg/dL
SPECIFIC GRAVITY, URINE: 1.009 (ref 1.005–1.030)
UROBILINOGEN UA: 0.2 mg/dL (ref 0.0–1.0)
pH: 6.5 (ref 5.0–8.0)

## 2014-01-03 LAB — CBC
HCT: 35.6 % — ABNORMAL LOW (ref 39.0–52.0)
HCT: 36.3 % — ABNORMAL LOW (ref 39.0–52.0)
HEMOGLOBIN: 12.4 g/dL — AB (ref 13.0–17.0)
Hemoglobin: 12.2 g/dL — ABNORMAL LOW (ref 13.0–17.0)
MCH: 33.4 pg (ref 26.0–34.0)
MCH: 33.6 pg (ref 26.0–34.0)
MCHC: 34.3 g/dL (ref 30.0–36.0)
MCHC: 34.2 g/dL (ref 30.0–36.0)
MCV: 97.5 fL (ref 78.0–100.0)
MCV: 98.4 fL (ref 78.0–100.0)
Platelets: 170 10*3/uL (ref 150–400)
Platelets: 168 10*3/uL (ref 150–400)
RBC: 3.65 MIL/uL — ABNORMAL LOW (ref 4.22–5.81)
RBC: 3.69 MIL/uL — AB (ref 4.22–5.81)
RDW: 13.5 % (ref 11.5–15.5)
RDW: 13.5 % (ref 11.5–15.5)
WBC: 9 10*3/uL (ref 4.0–10.5)
WBC: 8.1 10*3/uL (ref 4.0–10.5)

## 2014-01-03 LAB — BASIC METABOLIC PANEL
BUN: 18 mg/dL (ref 6–23)
CHLORIDE: 104 meq/L (ref 96–112)
CO2: 24 mEq/L (ref 19–32)
Calcium: 8.5 mg/dL (ref 8.4–10.5)
Creatinine, Ser: 1.13 mg/dL (ref 0.50–1.35)
GFR calc non Af Amer: 55 mL/min — ABNORMAL LOW (ref >90)
GFR, EST AFRICAN AMERICAN: 64 mL/min — AB (ref >90)
Glucose, Bld: 99 mg/dL (ref 70–99)
POTASSIUM: 4.1 meq/L (ref 3.7–5.3)
SODIUM: 141 meq/L (ref 137–147)

## 2014-01-03 LAB — I-STAT CHEM 8, ED
BUN: 21 mg/dL (ref 6–23)
CALCIUM ION: 1.19 mmol/L (ref 1.13–1.30)
CHLORIDE: 102 meq/L (ref 96–112)
Creatinine, Ser: 1.5 mg/dL — ABNORMAL HIGH (ref 0.50–1.35)
GLUCOSE: 100 mg/dL — AB (ref 70–99)
HEMATOCRIT: 41 % (ref 39.0–52.0)
Hemoglobin: 13.9 g/dL (ref 13.0–17.0)
Potassium: 4 mEq/L (ref 3.7–5.3)
Sodium: 143 mEq/L (ref 137–147)
TCO2: 25 mmol/L (ref 0–100)

## 2014-01-03 LAB — MAGNESIUM: MAGNESIUM: 2.1 mg/dL (ref 1.5–2.5)

## 2014-01-03 LAB — PHOSPHORUS: PHOSPHORUS: 2.1 mg/dL — AB (ref 2.3–4.6)

## 2014-01-03 LAB — PROTIME-INR
INR: 1.03 (ref 0.00–1.49)
PROTHROMBIN TIME: 13.3 s (ref 11.6–15.2)

## 2014-01-03 SURGERY — PERMANENT PACEMAKER INSERTION
Anesthesia: LOCAL

## 2014-01-03 MED ORDER — SODIUM CHLORIDE 0.9 % IV SOLN: INTRAVENOUS | Status: DC

## 2014-01-03 MED ORDER — CEFAZOLIN SODIUM 1-5 GM-% IV SOLN
1.0000 g | Freq: Four times a day (QID) | INTRAVENOUS | Status: AC
  Administered 2014-01-03 – 2014-01-04 (×2): 1 g via INTRAVENOUS
  Filled 2014-01-03 (×2): qty 50

## 2014-01-03 MED ORDER — LIDOCAINE HCL (PF) 1 % IJ SOLN
INTRAMUSCULAR | Status: AC
  Filled 2014-01-03: qty 30

## 2014-01-03 MED ORDER — SODIUM CHLORIDE 0.9 % IV SOLN
INTRAVENOUS | Status: DC
  Administered 2014-01-03: 12:00:00 via INTRAVENOUS

## 2014-01-03 MED ORDER — CEFAZOLIN SODIUM-DEXTROSE 2-3 GM-% IV SOLR
2.0000 g | INTRAVENOUS | Status: DC
  Filled 2014-01-03 (×2): qty 50

## 2014-01-03 MED ORDER — CHLORHEXIDINE GLUCONATE 4 % EX LIQD
60.0000 mL | Freq: Once | CUTANEOUS | Status: AC
  Administered 2014-01-03: 4 via TOPICAL
  Filled 2014-01-03: qty 60

## 2014-01-03 MED ORDER — SODIUM CHLORIDE 0.9 % IR SOLN
80.0000 mg | Status: DC
  Filled 2014-01-03 (×2): qty 2

## 2014-01-03 MED ORDER — CHLORHEXIDINE GLUCONATE 4 % EX LIQD
60.0000 mL | Freq: Once | CUTANEOUS | Status: AC
  Filled 2014-01-03: qty 60

## 2014-01-03 MED ORDER — CEFAZOLIN SODIUM 1-5 GM-% IV SOLN
1.0000 g | Freq: Four times a day (QID) | INTRAVENOUS | Status: DC
  Filled 2014-01-03 (×3): qty 50

## 2014-01-03 MED ORDER — HEPARIN (PORCINE) IN NACL 2-0.9 UNIT/ML-% IJ SOLN
INTRAMUSCULAR | Status: AC
  Filled 2014-01-03: qty 1000

## 2014-01-03 NOTE — Progress Notes (Signed)
Admitted patient from [name] ED with chief complaint of syncope and fracture from fall. Patient is alert and oriented x 2. With 1 PIV, intact. No major skin issues. Patient's hearing aid was sent home with family [name]). Initial assessment, please see [name] sheet. Will monitor accordingly.

## 2014-01-03 NOTE — Progress Notes (Signed)
Small amount of blood noted on pacemaker dressing. Marked blood on dressing. Notified by nurse tech that pt was attempting to pull himself up using his L arm. Pt is confused. Reoriented pt and discussed importance of not using his left arm. Arm is in sling. Pt was attempting to remove sling. Family at bedside. Will continue to monitor pt closely.

## 2014-01-03 NOTE — Progress Notes (Signed)
PT Cancellation Note  Patient Details Name: BILLY TURVEY MRN: [id number] DOB: 1920-12-29   Cancelled Treatment:    Reason Eval/Treat Not Completed: Medical issues which prohibited therapy.  Pt going for pacemaker today.  Also, need medicine and cardiac clearance prior to PT eval.  Medicine MD and Cardiac MD - please advise after pacemaker placement if evaluation can occur.  Thanks.   INGOLD,Lakresha Stifter 01/03/2014, 8:41 AM  [facility] [phone] (pager)

## 2014-01-03 NOTE — Progress Notes (Signed)
Utilization Review Completed Castin Donaghue J. Laiza Veenstra, RN, BSN, NCM [phone]  

## 2014-01-03 NOTE — Op Note (Signed)
Procedure report  Procedure performed:  1. Implantation of new dual chamber permanent pacemaker 2. Fluoroscopy  Reason for procedure: Symptomatic bradycardia due to: Second degree atrioventricular block Mobitz type  II    Procedure performed by: Sanda Klein, MD  Complications: None  Estimated blood loss: <10 mL  Medications administered during procedure: Ancef 2 g intravenously  Device details: Risk manager. Jude Assurity model 7322 serial number [id number] Right atrial lead St. [id number] serial number [id number] Right ventricular lead St Jude [id number]-58 serial number [id number]  Procedure details:  After the risks and benefits of the procedure were discussed the patient provided informed consent and was brought to the cardiac cath lab in the fasting state. The patient was prepped and draped in usual sterile fashion. Local anesthesia with 1% lidocaine was administered to to the left infraclavicular area. A 5-6 cm horizontal incision was made parallel with and 2-3 cm caudal to the left clavicle. Using electrocautery and blunt dissection a prepectoral pocket was created down to the level of the pectoralis major muscle fascia. The pocket was carefully inspected for hemostasis. An antibiotic-soaked sponge was placed in the pocket.  Under fluoroscopic guidance and using the modified Seldinger technique 2 separate venipunctures were performed to access the left subclavian vein. No difficulty was encountered accessing the vein.  Two J-tip guidewires were subsequently exchanged for two 7 French safe sheaths.  Under fluoroscopic guidance the ventricular lead was advanced to level of the mid to apical right ventricular septum and thet active-fixation helix was deployed. Prominent current of injury was seen. Satisfactory pacing and sensing parameters were recorded. There was no evidence of diaphragmatic stimulation at maximum device output. The safe sheath was peeled away and the lead was  secured in place with 2-0 silk.  In similar fashion the right atrial lead was advanced to the level of the atrial appendage. The active-fixation helix was deployed. There was prominent current of injury. Satisfactory  pacing and sensing parameters were recorded. There was no evidence of diaphragmatic stimulation with pacing at maximum device output. The safe sheath was peeled away and the lead was secured in place with 2-0 silk.  The antibiotic-soaked sponge was removed from the pocket. The pocket was flushed with copious amounts of antibiotic solution. Reinspection showed excellent hemostasis..  The ventricular lead was connected to the generator and appropriate ventricular pacing was seen. Subsequently the atrial lead was also connected. Repeat testing of the lead parameters later showed excellent values.  The entire system was then carefully inserted in the pocket with care been taking that the leads and device assumed a comfortable position without pressure on the incision. Great care was taken that the leads be located deep to the generator. The pocket was then closed in layers using 2 layers of 2-0 Vicryl and cutaneous staples, after which a sterile dressing was applied.  At the end of the procedure the following lead parameters were encountered:  Right atrial lead  sensed P waves 3.1 mV, impedance 760 ohms, threshold 0.5 V at 0.5 ms pulse width.  Right ventricular lead sensed R waves >12 mV, impedance 640 ohms, threshold 0.5 V at 0.5 ms pulse width.   Sanda Klein, MD, [facility] [phone] office [phone] pager

## 2014-01-03 NOTE — Progress Notes (Signed)
Patient had sinus arrest episode 6.7 secs as per [employer]. Patient was assessed in the room; Patient is alert; no complaints of pain; asymptomatic. VS as follows: HR 45; BP 123/53; RR 18; 02 100%. MD made aware face to face. Endorsed accordingly.

## 2014-01-03 NOTE — Progress Notes (Signed)
Patient pulled his IV out and all his EKG leads (twice). Patient is confused. Charge nurse made aware. Bilateral mittens was placed.

## 2014-01-03 NOTE — Progress Notes (Signed)
[facility]                                                                              Patient Demographics  Daniel [name], is a 80 y.o. male, DOB - 1921/07/30, YQM:[id number]  Admit date - 01/02/2014   Admitting Physician Costin Karlyne Greenspan, MD  Outpatient Primary MD for the patient is [name], [name], MD  LOS - 1   Chief Complaint  Patient presents with  . Fall        Assessment & Plan   Second Degree heart block -Cardiology has been consulted, please make a planned for today 01/03/14 -Echocardiogram pending -PendingTSH, magnesium 2.1, phosphorus 2.1  Left malleolus fracture -Orthopedics consulted and patient placed in a Cam Walker boot -Will follow as outpatient 3-4 weeks  Alzheimer's disease -Continue Namenda and Aricept  BPH -Urinary retention, currently on no medication -Will continue to monitor  GERD -Continue PPI  Hypertension -Currently controlled with diet, on no medications we'll continue to monitor  Hyperlipidemia -On no medications, likely diet-controlled  Allergic rhinitis and asthma -Continue Flonase  Depression and anxiety -Continue Zoloft  Increased ocular pressure -Continue eyedrops  Code Status: Full  Family Communication: None at bedside.  Attempting to contact via phone.  Disposition Plan: Admitted  Time Spent in minutes   35 minutes  Procedures None  Consults   Cardiology Orthopedics  DVT Prophylaxis  Lovenox  Lab Results  Component Value Date   PLT 170 01/02/2014    Medications  Scheduled Meds: . aspirin EC  81 mg Oral Daily  .  ceFAZolin (ANCEF) IV  2 g Intravenous On Call  . cholecalciferol  1,000 Units Oral Daily  . donepezil  10 mg Oral QHS  . fluticasone  2 spray Each Nare Daily  . gentamicin irrigation  80 mg Irrigation On Call  . heparin  5,000 Units Subcutaneous 3 times per day  . memantine  10 mg Oral BID  . mirabegron ER  50 mg Oral QHS  . multivitamin-lutein  1 capsule Oral Daily  .  olopatadine  1 drop Both Eyes BID  . pantoprazole  40 mg Oral Daily  . sertraline  25 mg Oral Daily  . sertraline  50 mg Oral QHS  . sodium chloride  3 mL Intravenous Q12H   Continuous Infusions: . sodium chloride    . sodium chloride 50 mL/hr at 01/02/14 1807   PRN Meds:.acetaminophen, acetaminophen, ondansetron (ZOFRAN) IV, ondansetron, oxyCODONE  Antibiotics    Anti-infectives   Start     Dose/Rate Route Frequency Ordered Stop   01/03/14 0600  gentamicin (GARAMYCIN) 80 mg in sodium chloride irrigation 0.9 % 500 mL irrigation     80 mg Irrigation On call 01/02/14 2018 01/04/14 0600   01/03/14 0600  ceFAZolin (ANCEF) IVPB 2 g/50 mL premix     2 g 100 mL/hr over 30 Minutes Intravenous On call 01/02/14 2018 01/04/14 0600        Subjective:   Jamier Urbas seen and examined today. Patient has no complaints.  He does not know why he is here.  He denies chest pain, shortness of breath, dizziness.   Objective:   Filed Vitals:  01/02/14 2009 01/02/14 2038 01/03/14 0244 01/03/14 0652  BP: 135/57  136/78 123/53  Pulse:   62 60  Temp: 98.2 F (36.8 C)  98 F (36.7 C) 97.9 F (36.6 C)  TempSrc: Oral  Oral Oral  Resp: 18  18 18   Height:  5\' 8"  (1.727 m)    Weight:  70.353 kg (155 lb 1.6 oz)  70.35 kg (155 lb 1.5 oz)  SpO2: 100%  100% 100%    Wt Readings from Last 3 Encounters:  01/03/14 70.35 kg (155 lb 1.5 oz)  01/03/14 70.35 kg (155 lb 1.5 oz)  11/21/13 71.578 kg (157 lb 12.8 oz)     Intake/Output Summary (Last 24 hours) at 01/03/14 0802 Last data filed at 01/03/14 0100  Gross per 24 hour  Intake    240 ml  Output    400 ml  Net   -160 ml    Exam  General: Well developed, well nourished, NAD, appears stated age  HEENT: NCAT, PERRLA, EOMI, Anicteic Sclera, mucous membranes moist.  Neck: Supple, no JVD, no masses  Cardiovascular: S1 S2 auscultated, Regular rate and rhythm.  Respiratory: Clear to auscultation bilaterally with equal chest rise  Abdomen:  Soft, nontender, nondistended, + bowel sounds  Extremities: warm dry without cyanosis clubbing or edema  Neuro: Awake and alert, oriented to only self, confused, cranial nerves grossly intact. Strength 5/5 in patient's upper and lower extremities bilaterally  Skin: Without rashes exudates or nodules  Psych: Normal affect and demeanor  Data Review   Micro Results No results found for this or any previous visit (from the past 240 hour(s)).  Radiology Reports Dg Chest 2 View  01/02/2014   CLINICAL DATA:  80 year old who fell.  EXAM: CHEST  2 VIEW  COMPARISON:  DG ABDOMEN ACUTE 2V W/ 1V CHEST dated 08/23/2013; DG CHEST 2 VIEW dated 04/09/2009  FINDINGS: The heart size and mediastinal contours are stable. There are slightly lower lung volumes with mild resulting bibasilar atelectasis. No edema, confluent airspace opacity, pleural effusion or pneumothorax is identified. The bones are demineralized. There is a questionable nondisplaced fracture of the right fourth rib posteriorly. No other acute osseous findings are evident.  IMPRESSION: Possible nondisplaced fracture of the right fourth rib posteriorly-correlate clinically. No pneumothorax or significant pleural effusion.   Electronically Signed   By: Camie Patience M.D.   On: 01/02/2014 14:27   Dg Ankle Complete Left  01/02/2014   CLINICAL DATA:  Left ankle pain and swelling after falling.  EXAM: LEFT ANKLE COMPLETE - 3+ VIEW  COMPARISON:  None.  FINDINGS: The bones are demineralized. There is a vague transverse lucency projecting across the lateral malleolus on the AP and oblique views which could reflect a nondisplaced fracture. There is moderate adjacent lateral soft tissue swelling. No widening of the ankle mortise is identified. The distal tibia and talus are intact. Scattered vascular calcifications are noted.  IMPRESSION: Possible nondisplaced transverse fracture of the lateral malleolus with adjacent soft tissue swelling.   Electronically Signed    By: Camie Patience M.D.   On: 01/02/2014 14:30   Ct Head Wo Contrast  01/02/2014   CLINICAL DATA:  Fall, head pain, neck pain.  Alzheimer's dementia.  EXAM: CT HEAD WITHOUT CONTRAST  CT CERVICAL SPINE WITHOUT CONTRAST  TECHNIQUE: Multidetector CT imaging of the head and cervical spine was performed following the standard protocol without intravenous contrast. Multiplanar CT image reconstructions of the cervical spine were also generated.  COMPARISON:  None.  FINDINGS:  CT HEAD FINDINGS  Advanced atrophy with chronic microvascular ischemic change. No visible acute stroke, hydrocephalus, or extra-axial fluid. No skull fracture or intracranial hemorrhage. Advanced vascular calcification. No visible acute sinus or mastoid fluid.  CT CERVICAL SPINE FINDINGS  There is no visible cervical spine fracture, traumatic subluxation, prevertebral soft tissue swelling, or intraspinal hematoma. Osseous demineralization. Disc space narrowing C5-C6. Degenerative change associated with C1-2 articulation without significant pannus. Moderate facet arthropathy C3-C4 on the right. Carotid atherosclerosis. No pneumothorax.  IMPRESSION: Age related senescent changes. No skull fracture or intracranial hemorrhage.  No cervical spine fracture or traumatic subluxation.   Electronically Signed   By: Rolla Flatten M.D.   On: 01/02/2014 14:10   Ct Cervical Spine Wo Contrast  01/02/2014   CLINICAL DATA:  Fall, head pain, neck pain.  Alzheimer's dementia.  EXAM: CT HEAD WITHOUT CONTRAST  CT CERVICAL SPINE WITHOUT CONTRAST  TECHNIQUE: Multidetector CT imaging of the head and cervical spine was performed following the standard protocol without intravenous contrast. Multiplanar CT image reconstructions of the cervical spine were also generated.  COMPARISON:  None.  FINDINGS: CT HEAD FINDINGS  Advanced atrophy with chronic microvascular ischemic change. No visible acute stroke, hydrocephalus, or extra-axial fluid. No skull fracture or intracranial  hemorrhage. Advanced vascular calcification. No visible acute sinus or mastoid fluid.  CT CERVICAL SPINE FINDINGS  There is no visible cervical spine fracture, traumatic subluxation, prevertebral soft tissue swelling, or intraspinal hematoma. Osseous demineralization. Disc space narrowing C5-C6. Degenerative change associated with C1-2 articulation without significant pannus. Moderate facet arthropathy C3-C4 on the right. Carotid atherosclerosis. No pneumothorax.  IMPRESSION: Age related senescent changes. No skull fracture or intracranial hemorrhage.  No cervical spine fracture or traumatic subluxation.   Electronically Signed   By: Rolla Flatten M.D.   On: 01/02/2014 14:10   Dg Knee Complete 4 Views Right  01/02/2014   CLINICAL DATA:  Knee pain following injury  EXAM: RIGHT KNEE - COMPLETE 4+ VIEW  COMPARISON:  None.  FINDINGS: No acute fracture or dislocation is noted. Mild joint space narrowing is noted medially. No gross soft tissue abnormality is seen.  IMPRESSION: Mild degenerative change without acute abnormality.   Electronically Signed   By: Inez Catalina M.D.   On: 01/02/2014 14:30    CBC  Recent Labs Lab 01/02/14 1445 01/02/14 2255  WBC 8.8 9.0  HGB 12.9* 12.2*  HCT 38.3* 35.6*  PLT 193 170  MCV 97.5 97.5  MCH 32.8 33.4  MCHC 33.7 34.3  RDW 13.4 13.5    Chemistries   Recent Labs Lab 01/02/14 2255  CREATININE 1.19  MG 2.1   ------------------------------------------------------------------------------------------------------------------ estimated creatinine clearance is 39.9 ml/min (by C-G formula based on Cr of 1.19). ------------------------------------------------------------------------------------------------------------------ No results found for this basename: HGBA1C,  in the last 72 hours ------------------------------------------------------------------------------------------------------------------ No results found for this basename: CHOL, HDL, LDLCALC, TRIG,  CHOLHDL, LDLDIRECT,  in the last 72 hours ------------------------------------------------------------------------------------------------------------------ No results found for this basename: TSH, T4TOTAL, FREET3, T3FREE, THYROIDAB,  in the last 72 hours ------------------------------------------------------------------------------------------------------------------ No results found for this basename: VITAMINB12, FOLATE, FERRITIN, TIBC, IRON, RETICCTPCT,  in the last 72 hours  Coagulation profile No results found for this basename: INR, PROTIME,  in the last 168 hours  No results found for this basename: DDIMER,  in the last 72 hours  Cardiac Enzymes No results found for this basename: CK, CKMB, TROPONINI, MYOGLOBIN,  in the last 168 hours ------------------------------------------------------------------------------------------------------------------ No components found with this basename: POCBNP,     Doshie Maggi,  Katori Wirsing D.O. on 01/03/2014 at 8:02 AM  Between 7am to 7pm - Pager - [phone]  After 7pm go to [email or web address] - password [email or web address]  And look for the night coverage person covering for me after hours  [facility] Office  [phone]

## 2014-01-03 NOTE — Progress Notes (Signed)
Orthopedic Tech Progress Note Patient Details:  Daniel [name] Mar 28, 1921 [id number]  Ortho Devices Type of Ortho Device: Arm sling Ortho Device/Splint Location: Lt leg Ortho Device/Splint Interventions: Application   Cammer, Theodoro Parma 01/03/2014, 3:47 PM

## 2014-01-03 NOTE — Progress Notes (Addendum)
Pt returned from cath lab sp pacemaker placement to L upper chest. Per report, ancef and gentamycin were given before procedure. L arm in sling. Pt denied pain or concerns. Dressing clean, dry, and intact. Bed alarm on. Will continue to monitor pt closely.  Eulis Canner, RN

## 2014-01-03 NOTE — Progress Notes (Signed)
Subjective: Alert and confused.  No complaints  Objective: Vital signs in last 24 hours: Temp:  [97.9 F (36.6 C)-98.3 F (36.8 C)] 97.9 F (36.6 C) (03/26 7124) Pulse Rate:  [59-62] 60 (03/26 0652) Resp:  [16-18] 18 (03/26 0652) BP: (119-138)/(41-78) 123/53 mmHg (03/26 0652) SpO2:  [97 %-100 %] 100 % (03/26 0652) Weight:  [155 lb 1.5 oz (70.35 kg)-155 lb 1.6 oz (70.353 kg)] 155 lb 1.5 oz (70.35 kg) (03/26 0652) Last BM Date: 01/02/14  Intake/Output from previous day: 03/25 0701 - 03/26 0700 In: 240 [P.O.:240] Out: 400 [Urine:400] Intake/Output this shift:    Medications Current Facility-Administered Medications  Medication Dose Route Frequency Provider Last Rate Last Dose  . 0.9 %  sodium chloride infusion   Intravenous Continuous Mihai Croitoru, MD      . 0.9 %  sodium chloride infusion   Intravenous Continuous Barton Dubois, MD 50 mL/hr at 01/02/14 1807    . acetaminophen (TYLENOL) tablet 650 mg  650 mg Oral Q6H PRN Barton Dubois, MD       Or  . acetaminophen (TYLENOL) suppository 650 mg  650 mg Rectal Q6H PRN Barton Dubois, MD      . aspirin EC tablet 81 mg  81 mg Oral Daily Costin Karlyne Greenspan, MD      . ceFAZolin (ANCEF) IVPB 2 g/50 mL premix  2 g Intravenous On Call Sanda Klein, MD      . cholecalciferol (VITAMIN D) tablet 1,000 Units  1,000 Units Oral Daily Barton Dubois, MD      . donepezil (ARICEPT) tablet 10 mg  10 mg Oral QHS Barton Dubois, MD   10 mg at 01/02/14 2200  . fluticasone (FLONASE) 50 MCG/ACT nasal spray 2 spray  2 spray Each Nare Daily Barton Dubois, MD      . gentamicin (GARAMYCIN) 80 mg in sodium chloride irrigation 0.9 % 500 mL irrigation  80 mg Irrigation On Call Sanda Klein, MD      . heparin injection 5,000 Units  5,000 Units Subcutaneous 3 times per day Barton Dubois, MD   5,000 Units at 01/02/14 2304  . memantine (NAMENDA) tablet 10 mg  10 mg Oral BID Barton Dubois, MD   10 mg at 01/02/14 2200  . mirabegron ER (MYRBETRIQ) tablet 50 mg   50 mg Oral QHS Barton Dubois, MD   50 mg at 01/02/14 2200  . multivitamin-lutein (OCUVITE-LUTEIN) capsule 1 capsule  1 capsule Oral Daily Costin Karlyne Greenspan, MD      . olopatadine (PATANOL) 0.1 % ophthalmic solution 1 drop  1 drop Both Eyes BID Barton Dubois, MD   1 drop at 01/02/14 2201  . ondansetron (ZOFRAN) tablet 4 mg  4 mg Oral Q6H PRN Barton Dubois, MD       Or  . ondansetron [facility]) injection 4 mg  4 mg Intravenous Q6H PRN Barton Dubois, MD      . oxyCODONE (Oxy IR/ROXICODONE) immediate release tablet 5 mg  5 mg Oral Q6H PRN Barton Dubois, MD      . pantoprazole (PROTONIX) EC tablet 40 mg  40 mg Oral Daily Barton Dubois, MD      . sertraline (ZOLOFT) tablet 25 mg  25 mg Oral Daily Costin Karlyne Greenspan, MD      . sertraline (ZOLOFT) tablet 50 mg  50 mg Oral QHS Caren Griffins, MD   50 mg at 01/02/14 2200  . sodium chloride 0.9 % injection 3 mL  3 mL Intravenous Q12H Clifton James  Dyann Kief, MD   3 mL at 01/02/14 2200    PE: General appearance: alert, cooperative and no distress Lungs: clear to auscultation bilaterally Heart: Reg rhythm.  Rate slow.  No MM Extremities: No LEE Pulses: 2+ and symmetric Skin: Warm and dry Neurologic: Alert Confused.    Lab Results:   Recent Labs  01/02/14 1445 01/02/14 2255 01/03/14 0650  WBC 8.8 9.0 8.1  HGB 12.9* 12.2* 12.4*  HCT 38.3* 35.6* 36.3*  PLT 193 170 168   BMET  Recent Labs  01/02/14 2255 01/03/14 0650  NA  --  141  K  --  4.1  CL  --  104  CO2  --  24  GLUCOSE  --  99  BUN  --  18  CREATININE 1.19 1.13  CALCIUM  --  8.5      Assessment/Plan  80 y.o. male with a past medical history significant for excellent health, with recent fatigue, exertional dyspnea and possible syncope. Usually walks 1 mile in 17 minutes, twice daily. Recently breathless doing it. Fell at home and suspected to have briefly lost consciousness yesterday.His memory is poor and his daughter provides a lot of the history. Despite dementia, he lives in his own  home and she checks on him frequently. Physically, he is very functional and independent.  Principal Problem:   Heart block- mobitz 2  Sinus brady at 33bpm on tele.  For PPM today.   Active Problems:   Alzheimer's disease  He appears to be more confused today.  Pulling out lines and tele.  May be worse due to hypoperfusion from  Bradycardia and being out of usual environment.  Recommend UA   Left malleolus fracture.  Ortho following.   BPH (benign prostatic hyperplasia)   GERD (gastroesophageal reflux disease)  On protonix   HTN (hypertension)  BP stable.  No meds.   Asthma  Breathing well.  No wheezing.     LOS: 1 day    HAGER, BRYAN PA-C 01/03/2014 9:17 AM  Patient seen, examined. Available data reviewed. Agree with findings, assessment, and plan as outlined by Tarri Fuller, PA-C. The patient is more confused here in the hospital. His daughters are at his bedside and they state his confusion is much worse than normal. He denies pain or shortness of breath. He thinks he was at the beach yesterday. Heart is bradycardic and regular. Lung fields are clear. There is no peripheral edema. Plan as outlined for permanent pacemaker today. Dr Sallyanne Kuster to perform. Procedure indications and risks reviewed with family. Suspect he will need ST-SNF post-discharge.  Sherren Mocha, M.D. 01/03/2014 11:24 AM

## 2014-01-04 ENCOUNTER — Inpatient Hospital Stay (HOSPITAL_COMMUNITY): Payer: Medicare Other

## 2014-01-04 DIAGNOSIS — Z950 Presence of cardiac pacemaker: Secondary | ICD-10-CM | POA: Diagnosis present

## 2014-01-04 DIAGNOSIS — I5031 Acute diastolic (congestive) heart failure: Secondary | ICD-10-CM

## 2014-01-04 DIAGNOSIS — R55 Syncope and collapse: Secondary | ICD-10-CM | POA: Diagnosis present

## 2014-01-04 DIAGNOSIS — R609 Edema, unspecified: Secondary | ICD-10-CM

## 2014-01-04 DIAGNOSIS — W19XXXA Unspecified fall, initial encounter: Secondary | ICD-10-CM

## 2014-01-04 MED ORDER — FUROSEMIDE 10 MG/ML IJ SOLN
20.0000 mg | Freq: Once | INTRAMUSCULAR | Status: AC
  Administered 2014-01-04: 20 mg via INTRAVENOUS

## 2014-01-04 MED ORDER — OXYCODONE HCL 5 MG PO TABS: 5.0000 mg | ORAL_TABLET | Freq: Four times a day (QID) | ORAL | Status: DC | PRN

## 2014-01-04 MED ORDER — FUROSEMIDE 10 MG/ML IJ SOLN
INTRAMUSCULAR | Status: AC
  Filled 2014-01-04: qty 4

## 2014-01-04 MED ORDER — LORAZEPAM 2 MG/ML IJ SOLN: 1.0000 mg | Freq: Once | INTRAMUSCULAR | Status: DC

## 2014-01-04 MED ORDER — LORAZEPAM 2 MG/ML IJ SOLN
1.0000 mg | Freq: Once | INTRAMUSCULAR | Status: AC
  Administered 2014-01-04: 1 mg via INTRAVENOUS
  Filled 2014-01-04: qty 1

## 2014-01-04 NOTE — Clinical Social Work Note (Signed)
CSW received call this morning indicating that patient will need SNF before returning home. CSW visited the room and spoke with both daughters Merlene Morse and Aggie Moats. Patient was sitting up in the chair, but did not participate in the conversation. CSW talked with daughters about SNF for ST rehab and they were familiar this as their  mother required rehab at one time. Daughters given skilled nursing facility list and facility search process explained. Skilled facility preference was [facility].  CSW talked with Ms. Fitzgerald later today and advised her that [facility] did not make a bed offer. CSW learned from daughter, and confirmed with attending MD that patient will not discharge until Saturday due to a test scheduled for Saturday. Ms. Ola Spur[facility] selected [facility], and facility contacted and can take patient on Saturday. Daughter will complete admissions paperwork today and patient will discharge 3/28, Saturday - transported by ambulance. Discharge summary transmitted to facility on 3/27.  Nadav Swindell Givens, MSW, LCSW [phone]

## 2014-01-04 NOTE — Discharge Instructions (Signed)

## 2014-01-04 NOTE — Progress Notes (Signed)
01/03/14 1900-0700 Pt.is A/Ox1 and is on room air. Pt.had pacemaker placement on 3/26 and has had left arm in sling. Dressing to left upper chest had old drainage present upon bedside report which was outlined earlier in the afternoon. No new drainage was present. Pt.daughter remained at the bedside re-orienting patient. He had multiple attempts to get out of bed and became more agitated during the night. Pt.daughter was requesting something to help relax patient. Midlevel on call with [facility] was called and notified. Order written for ativan IV. Ativan IV was given and was effective. He had no signs of distress.

## 2014-01-04 NOTE — Progress Notes (Signed)
PT Cancellation Note  Patient Details Name: Daniel [name] MRN: [id number] DOB: 01-Mar-1921   Cancelled Treatment:    Reason Eval/Treat Not Completed: Medical issues which prohibited therapy (continue to await medical and cardiac clearance for mobility. Please advise)   Lanetta Inch [facility] 01/04/2014, 7:18 AM Elwyn Reach, [place]

## 2014-01-04 NOTE — Clinical Social Work Placement (Signed)
Clinical Social Work Department CLINICAL SOCIAL WORK PLACEMENT NOTE 01/04/2014  Patient:  Daniel [name], Daniel [name]  Account Number:  [id number] Admit date:  01/02/2014  Clinical Social Worker:  Jef Futch Givens, LCSW  Date/time:  01/04/2014 06:55 AM  Clinical Social Work is seeking post-discharge placement for this patient at the following level of care:   [facility]   (*CSW will update this form in Epic as items are completed)   01/04/2014  Patient/family provided with [facility] Department of Clinical Social Work's list of facilities offering this level of care within the geographic area requested by the patient (or if unable, by the patient's family).  01/04/2014  Patient/family informed of their freedom to choose among providers that offer the needed level of care, that participate in Medicare, Medicaid or managed care program needed by the patient, have an available bed and are willing to accept the patient.    Patient/family informed of [facility]' ownership interest in [facility], as well as of the fact that they are under no obligation to receive care at this facility.  PASARR submitted to EDS on 01/04/2014 PASARR number received from EDS on 01/04/2014  FL2 transmitted to all facilities in geographic area requested by pt/family on  01/04/2014 FL2 transmitted to all facilities within larger geographic area on   Patient informed that his/her managed care company has contracts with or will negotiate with  certain facilities, including the following:     Patient/family informed of bed offers received:  01/04/2014 Patient chooses bed at [facility] Physician recommends and patient chooses bed at    Patient to be transferred to [facility] on  01/05/2014 Patient to be transferred to facility by ambulance  The following physician request were entered in Epic:   Additional Comments:

## 2014-01-04 NOTE — Progress Notes (Signed)
Patient somnolent but easily arousable this morning related to PRN administration during night, but alert and oriented x1-2 later in shift.  Consistently oriented to self and intermittently oriented to location.  Patient and family deny any questions or concerns at this time.  Up to chair with one assist.  Patient denies anxiety and pain, visibly calm, and not agitated.  Patient's daughter states "he's so much better than last night".  Will continue to monitor.

## 2014-01-04 NOTE — Progress Notes (Addendum)
Subjective:  The patient is confused. He has mild pleuritic chest discomfort. No shortness of breath or other complaints. He does not remember having his pacemaker implanted yesterday.  Objective:  Vital Signs in the last 24 hours: Temp:  [97.3 F (36.3 C)-98.8 F (37.1 C)] 97.3 F (36.3 C) (03/27 0700) Pulse Rate:  [33-67] 59 (03/27 0700) Resp:  [18-20] 20 (03/27 0700) BP: (121-148)/(44-90) 148/59 mmHg (03/27 0700) SpO2:  [95 %-98 %] 97 % (03/27 0700) Weight:  [151 lb 14.4 oz (68.9 kg)] 151 lb 14.4 oz (68.9 kg) (03/27 0700)  Intake/Output from previous day:  Intake/Output Summary (Last 24 hours) at 01/04/14 0829 Last data filed at 01/04/14 0700  Gross per 24 hour  Intake 737.67 ml  Output   1325 ml  Net -587.33 ml    Physical Exam: The patient is pleasantly confused. He is an elderly gentleman in no distress. HEENT normal JVP normal Heart is regular rate and rhythm Lungs are clear bilaterally Abdomen is soft, nontender, nondistended, positive bowel sounds Extremities: No edema Chest: Pacemaker site is clear with dressing in place   Rate: 60 beats per minute  Rhythm: AV sequential pacing  Lab Results:  Recent Labs  01/02/14 2255 01/03/14 0650  WBC 9.0 8.1  HGB 12.2* 12.4*  PLT 170 168    Recent Labs  01/02/14 1503 01/02/14 2255 01/03/14 0650  NA 143  --  141  K 4.0  --  4.1  CL 102  --  104  CO2  --   --  24  GLUCOSE 100*  --  99  BUN 21  --  18  CREATININE 1.50* 1.19 1.13   No results found for this basename: TROPONINI, CK, MB,  in the last 72 hours  Recent Labs  01/03/14 1212  INR 1.03    Imaging: Imaging results have been reviewed  Cardiac Studies:  Assessment/Plan:  80 y.o. male with a past medical history significant for excellent health, with recent fatigue, exertional dyspnea and possible syncope. Usually walks 1 mile in 17 minutes, twice daily. Recently breathless doing it. Fell at home and suspected to have briefly lost  consciousness yesterday.His memory is poor and his daughter provides a lot of the history. Despite dementia, he lives in his own home and she checks on him frequently. Physically, he is very functional and independent.  Telemetry showed second degree atrioventricular block Mobitz type II and bradycardia. He underwent placement of a St Jude Pacemaker 01/03/14.     Principal Problem:   Syncope and collapse Active Problems:   Heart block AV second degree   Cardiac pacemaker implanted- St Jude - 01/03/14   Alzheimer's disease   HTN (hypertension)   BPH (benign prostatic hyperplasia)   GERD (gastroesophageal reflux disease)   Other and unspecified hyperlipidemia   Asthma    PLAN:   Kerin Ransom PA-C  Patient seen, examined. Available data reviewed. Agree with findings, assessment, and plan as outlined by Kerin Ransom, PA-C. I have documented the physical exam above. I discussed the disposition with the patient's daughter. He is stable day 1 following permanent pacemaker placement. There is mild edema on his CXR suggestive of acute diastolic CHF likely related to his conduction disease. Will check a 2D Echo to assess LV function and will give a single dose of IV lasix and follow-up with a BMET in the morning. The primary issue relates to his dementia. He is increasingly confused since he has been in the hospital. He is  not able to comply with post pacemaker restrictions. The family cannot provide 24-hour care. While he would probably be better returning home from a cognitive perspective, I do not think he can be discharged home without 24-hour supervision. Will consult a Education officer, museum for consideration of short-term rehabilitation. Will arrange cardiac follow-up. thx  Sherren Mocha, M.D. 01/04/2014 10:40 AM

## 2014-01-04 NOTE — Evaluation (Signed)
Physical Therapy Evaluation Patient Details Name: Daniel [name] MRN: [id number] DOB: [birth year] Today's Date: 01/04/2014   History of Present Illness  Daniel [name] is a very active 80 y.o. male, with pmh of HTN, HLD, alzheimer dementia, asthma, GERD and depression/anxiety; came to ED after experiencing a fall on 3/24 with brief LOC episode; patient reports having pain on his left foot and right side of his chest (costal area). In Ed patient found to be profoundly bradycardic with 2: 1 second degree AV block on EKG; also with left malleolus fracture and non-displaced 4th right rib fracture. Pt s/p pacemaker 3/26  Clinical Impression  Pt with dementia who lives alone and has support of dgtrs but who both have health issues and cannot provide physical assist for pt currently. Pt with lack of awareness or memory of surgery or precautions despite repeated education and presents with below deficits. Pt requires constant cueing to attend to LUE placement and assist for transfers who will benefit from acute therapy to maximize mobility, function, safety and decrease burden of care. Recommend ST-SNF as pt lives alone and family cannot provide necessary assist at this time. Will continue to follow acutely.     Follow Up Recommendations SNF;Supervision/Assistance - 24 hour    Equipment Recommendations  None recommended by PT    Recommendations for Other Services       Precautions / Restrictions Precautions Precautions: Fall;ICD/Pacemaker Required Braces or Orthoses: Other Brace/Splint Other Brace/Splint: CAM boot left Restrictions Weight Bearing Restrictions: Yes LLE Weight Bearing: Weight bearing as tolerated      Mobility  Bed Mobility Overal bed mobility: Needs Assistance Bed Mobility: Rolling;Sidelying to Sit Rolling: Min assist Sidelying to sit: Min assist       General bed mobility comments: rolling to right and up to sitting with assist throughout to maintain left arm as side and  elevate trunk from surface  Transfers Overall transfer level: Needs assistance   Transfers: Sit to/from Stand Sit to Stand: Min assist         General transfer comment: cues for right hand placemetn and assist for elevation with cues to maintain LUE at his side  Ambulation/Gait Ambulation/Gait assistance: Min guard Ambulation Distance (Feet): 250 Feet Assistive device: Rolling walker (2 wheeled) Gait Pattern/deviations: Decreased stride length;Step-through pattern   Gait velocity interpretation: at or above normal speed for age/gender General Gait Details: cues for posture, position in RW and not to weight bear through LUE  Stairs            Wheelchair Mobility    Modified Rankin (Stroke Patients Only)       Balance Overall balance assessment: History of Falls                                   Pertinent Vitals/Pain 3/10 left chest pain, incisional site but pt unaware of sx    Home Living Family/patient expects to be discharged to:: Private residence Living Arrangements: Alone Available Help at Discharge: Family;Available 24 hours/day Type of Home: House Home Access: Stairs to enter   [employer] of Steps: 1 Home Layout: One level Home Equipment: Walker - 2 wheels;Walker - 4 wheels;Shower seat;Bedside commode;Cane - single point      Prior Function Level of Independence: Independent         Comments: family does the housework and driving and pt only prepares microwave meals but does not cook. Pt was walking  daily several miles until admission     Hand Dominance        Extremity/Trunk Assessment   Upper Extremity Assessment: LUE deficits/detail;RUE deficits/detail RUE Deficits / Details: WFL     LUE Deficits / Details: did not assess due to post op restrictions   Lower Extremity Assessment: Overall WFL for tasks assessed      Cervical / Trunk Assessment: Normal  Communication   Communication: HOH  Cognition  Arousal/Alertness: Awake/alert Behavior During Therapy: Flat affect Overall Cognitive Status: Impaired/Different from baseline Area of Impairment: Memory;Safety/judgement;Following commands     Memory: Decreased recall of precautions;Decreased short-term memory Following Commands: Follows one step commands consistently Safety/Judgement: Decreased awareness of safety     General Comments: pt with baseline dementia with increased STM deficits and unable to recall precautions or surgery acutely despite education    General Comments      Exercises        Assessment/Plan    PT Assessment Patient needs continued PT services  PT Diagnosis Altered mental status;Difficulty walking   PT Problem List Decreased strength;Decreased range of motion;Decreased activity tolerance;Decreased mobility;Decreased knowledge of use of DME;Decreased safety awareness;Decreased knowledge of precautions  PT Treatment Interventions Gait training;Functional mobility training;Therapeutic activities;Therapeutic exercise;Patient/family education;Cognitive remediation;DME instruction   PT Goals (Current goals can be found in the Care Plan section) Acute Rehab PT Goals Patient Stated Goal: return home PT Goal Formulation: With patient/family Time For Goal Achievement: 01/18/14 Potential to Achieve Goals: Good    Frequency Min 3X/week   Barriers to discharge Decreased caregiver support      End of Session Equipment Utilized During Treatment: Gait belt;Other (comment) (sling end of session in chair) Activity Tolerance: Patient tolerated treatment well Patient left: in chair;with call bell/phone within reach;with family/visitor present         Time: 5366-4403 PT Time Calculation (min): 32 min   Charges:   PT Evaluation $Initial PT Evaluation Tier I: 1 Procedure PT Treatments $Gait Training: 8-22 mins $Therapeutic Activity: 8-22 mins   PT G Codes:          Melford Aase 01/04/2014, 11:38  AM Elwyn Reach, [place]

## 2014-01-04 NOTE — Discharge Summary (Addendum)
Physician Discharge Summary  Daniel [name] DGL:[id number] DOB: Jun 11, 1921 DOA: 01/02/2014  PCP: Leandrew Koyanagi, MD  Admit date: 01/02/2014 Discharge date: 01/04/2014  Time spent: 45 minutes  Recommendations for Outpatient Follow-up:  Patient will be discharged to a skilled nursing facility. He will need followup with primary care physician, Dr. Laney Pastor within one week of discharge as well as cardiology at the specified time.  He will also need to follow up with Dr. Mayer Camel in 3 weeks.  He should follow a low sodium heart healthy diet.  Discharge Diagnoses:  Second degree heart block status post pacemaker Left malleolus fracture Alzheimer's disease BPH GERD Hypertension Hyperlipidemia Allergic rhinitis and asthma Depression and anxiety Increased ocular pressure  Discharge Condition: Stable  Diet recommendation: Heart healthy  Filed Weights   01/02/14 2038 01/03/14 0652 01/04/14 0700  Weight: 70.353 kg (155 lb 1.6 oz) 70.35 kg (155 lb 1.5 oz) 68.9 kg (151 lb 14.4 oz)    History of present illness:  Daniel [name] is a very active 80 y.o. male, with pmh of HTN, HLD, alzheimer dementia, asthma, GERD and depression/anxiety; came to ED after experiencing a fall on 3/24 with brief LOC episode; patient reports having pain on his left foot and right side of his chest (costal area). In Ed patient found to be profoundly bradycardic with 2: 1 second degree AV block on EKG; also with left malleolus fracture and non-displaced 4th right rib fracture. [facility] called to admit patient for further evaluation and treatment.  Cardiology and ortho consulted while in ED, plan is for pace maker placement on 3/26   Hospital Course:  Second Degree heart block  -Cardiology has been consulted -Implantation of new dual chamber permanent pacemaker 01/03/14 -Echocardiogram pending, those results can be followed up as an outpatient  -TSH 0.747, magnesium 2.1, phosphorus 2.1   Left malleolus fracture    -Orthopedics consulted and patient placed in a Cam Walker boot  -Will follow as outpatient 3-4 weeks   Alzheimer's disease  -Continue Namenda and Aricept   BPH  -Urinary retention, currently on no medication  -Will continue to monitor   GERD  -Continue PPI   Hypertension  -Currently controlled with diet, on no medications we'll continue to monitor   Hyperlipidemia  -On no medications, likely diet-controlled   Allergic rhinitis and asthma  -Continue Flonase   Depression and anxiety  -Continue Zoloft   Increased ocular pressure  -Continue eyedrops  Procedures: Implantation of new dual chamber permanent pacemaker  Echocardiogram  Consultations: Cardiology Orthopedics  Discharge Exam: Filed Vitals:   01/04/14 0700  BP: 148/59  Pulse: 59  Temp: 97.3 F (36.3 C)  Resp: 20   Exam  General: Well developed, well nourished, NAD, appears stated age  HEENT: NCAT, PERRLA, EOMI, Anicteic Sclera, mucous membranes moist.  Neck: Supple, no JVD, no masses  Cardiovascular: S1 S2 auscultated, Regular rate and rhythm.  Respiratory: Clear to auscultation bilaterally with equal chest rise  Abdomen: Soft, nontender, nondistended, + bowel sounds  Extremities: warm dry without cyanosis clubbing or edema  Neuro: Awake and alert, oriented to only self, confused, cranial nerves grossly intact.  Skin: Without rashes exudates or nodules  Psych: Normal affect and demeanor  Discharge Instructions  Discharge Orders   Future Appointments Provider Department Dept Phone   01/11/2014 10:00 AM Kathrynn Ducking, MD [facility] [phone]   01/15/2014 11:40 AM Cecilie Kicks, NP [facility] [phone]   Future Orders Complete By Expires   Diet -  low sodium heart healthy  As directed    Discharge instructions  As directed    Comments:     Patient will be discharged to home with home health services. He will need followup with primary care physician, Dr.  Laney Pastor within one week of discharge as well as cardiology at the specified time.  He should follow a low sodium heart healthy diet.   Increase activity slowly  As directed        Medication List         aspirin 81 MG tablet  Take 81 mg by mouth daily.     cholecalciferol 1000 UNITS tablet  Commonly known as:  VITAMIN D  Take 1,000 Units by mouth daily.     donepezil 10 MG tablet  Commonly known as:  ARICEPT  Take 10 mg by mouth at bedtime.     fluticasone 50 MCG/ACT nasal spray  Commonly known as:  FLONASE  Place 2 sprays into the nose daily.     memantine 10 MG tablet  Commonly known as:  NAMENDA  Take 1 tablet (10 mg total) by mouth 2 (two) times daily.     MYRBETRIQ 50 MG Tb24 tablet  Generic drug:  mirabegron ER  Take 50 mg by mouth daily.     olopatadine 0.1 % ophthalmic solution  Commonly known as:  PATANOL  1 drop 2 (two) times daily.     omeprazole 20 MG capsule  Commonly known as:  PRILOSEC  Take 20 mg by mouth every morning.     oxyCODONE 5 MG immediate release tablet  Commonly known as:  Oxy IR/ROXICODONE  Take 1 tablet (5 mg total) by mouth every 6 (six) hours as needed for moderate pain.     PRESERVISION AREDS Tabs  Take 1 tablet by mouth daily.     sertraline 25 MG tablet  Commonly known as:  ZOLOFT  Take 25-50 mg by mouth 2 (two) times daily. 25 mg in the morning and 50 mg at night       Allergies  Allergen Reactions  . Sanctura [Trospium]     Seizures, body spasms  . Biaxin [Clarithromycin] Hives       Follow-up Information   Follow up with Sanda Klein, MD. (Tuesday, April 7 11:40 Am, Cecilie Kicks, NP)    Specialty:  Cardiology   Contact information:   [address] [place] [address] [phone]       Follow up with [name], [name], MD. Schedule an appointment as soon as possible for a visit in 1 week. [facility] follow up)    Specialties:  Internal Medicine, Adolescent Medicine   Contact information:     [address] [place] [address] [phone]        The results of significant diagnostics from this hospitalization (including imaging, microbiology, ancillary and laboratory) are listed below for reference.    Significant Diagnostic Studies: Dg Chest 2 View  01/04/2014   CLINICAL DATA:  Status post pacemaker placement ; arm discomfort  EXAM: CHEST  2 VIEW  COMPARISON:  DG CHEST 2 VIEW dated 01/02/2014  FINDINGS: The lungs are adequately inflated. There is no focal infiltrate. Minimal prominence of the interstitial markings is present and is slightly more conspicuous than on the previous study. The cardiac silhouette is top-normal in size but stable. The pulmonary vascularity is mildly prominent today centrally. The permanent pacemaker appears appropriately positioned. There is no pneumothorax or pleural effusion. The observed portions of the bony  thorax appear normal.  IMPRESSION: Since the earlier study there has been there has developed mild pulmonary vascular congestion which may reflect low grade CHF or interstitial edema of noncardiac cause. There is no evidence of a pneumothorax or pleural effusion.   Electronically Signed   By: David  Martinique   On: 01/04/2014 08:26   Dg Chest 2 View  01/02/2014   CLINICAL DATA:  80 year old who fell.  EXAM: CHEST  2 VIEW  COMPARISON:  DG ABDOMEN ACUTE 2V W/ 1V CHEST dated 08/23/2013; DG CHEST 2 VIEW dated 04/09/2009  FINDINGS: The heart size and mediastinal contours are stable. There are slightly lower lung volumes with mild resulting bibasilar atelectasis. No edema, confluent airspace opacity, pleural effusion or pneumothorax is identified. The bones are demineralized. There is a questionable nondisplaced fracture of the right fourth rib posteriorly. No other acute osseous findings are evident.  IMPRESSION: Possible nondisplaced fracture of the right fourth rib posteriorly-correlate clinically. No pneumothorax or significant pleural effusion.    Electronically Signed   By: Camie Patience M.D.   On: 01/02/2014 14:27   Dg Ankle Complete Left  01/02/2014   CLINICAL DATA:  Left ankle pain and swelling after falling.  EXAM: LEFT ANKLE COMPLETE - 3+ VIEW  COMPARISON:  None.  FINDINGS: The bones are demineralized. There is a vague transverse lucency projecting across the lateral malleolus on the AP and oblique views which could reflect a nondisplaced fracture. There is moderate adjacent lateral soft tissue swelling. No widening of the ankle mortise is identified. The distal tibia and talus are intact. Scattered vascular calcifications are noted.  IMPRESSION: Possible nondisplaced transverse fracture of the lateral malleolus with adjacent soft tissue swelling.   Electronically Signed   By: Camie Patience M.D.   On: 01/02/2014 14:30   Ct Head Wo Contrast  01/02/2014   CLINICAL DATA:  Fall, head pain, neck pain.  Alzheimer's dementia.  EXAM: CT HEAD WITHOUT CONTRAST  CT CERVICAL SPINE WITHOUT CONTRAST  TECHNIQUE: Multidetector CT imaging of the head and cervical spine was performed following the standard protocol without intravenous contrast. Multiplanar CT image reconstructions of the cervical spine were also generated.  COMPARISON:  None.  FINDINGS: CT HEAD FINDINGS  Advanced atrophy with chronic microvascular ischemic change. No visible acute stroke, hydrocephalus, or extra-axial fluid. No skull fracture or intracranial hemorrhage. Advanced vascular calcification. No visible acute sinus or mastoid fluid.  CT CERVICAL SPINE FINDINGS  There is no visible cervical spine fracture, traumatic subluxation, prevertebral soft tissue swelling, or intraspinal hematoma. Osseous demineralization. Disc space narrowing C5-C6. Degenerative change associated with C1-2 articulation without significant pannus. Moderate facet arthropathy C3-C4 on the right. Carotid atherosclerosis. No pneumothorax.  IMPRESSION: Age related senescent changes. No skull fracture or intracranial  hemorrhage.  No cervical spine fracture or traumatic subluxation.   Electronically Signed   By: Rolla Flatten M.D.   On: 01/02/2014 14:10   Ct Cervical Spine Wo Contrast  01/02/2014   CLINICAL DATA:  Fall, head pain, neck pain.  Alzheimer's dementia.  EXAM: CT HEAD WITHOUT CONTRAST  CT CERVICAL SPINE WITHOUT CONTRAST  TECHNIQUE: Multidetector CT imaging of the head and cervical spine was performed following the standard protocol without intravenous contrast. Multiplanar CT image reconstructions of the cervical spine were also generated.  COMPARISON:  None.  FINDINGS: CT HEAD FINDINGS  Advanced atrophy with chronic microvascular ischemic change. No visible acute stroke, hydrocephalus, or extra-axial fluid. No skull fracture or intracranial hemorrhage. Advanced vascular calcification. No visible acute sinus or  mastoid fluid.  CT CERVICAL SPINE FINDINGS  There is no visible cervical spine fracture, traumatic subluxation, prevertebral soft tissue swelling, or intraspinal hematoma. Osseous demineralization. Disc space narrowing C5-C6. Degenerative change associated with C1-2 articulation without significant pannus. Moderate facet arthropathy C3-C4 on the right. Carotid atherosclerosis. No pneumothorax.  IMPRESSION: Age related senescent changes. No skull fracture or intracranial hemorrhage.  No cervical spine fracture or traumatic subluxation.   Electronically Signed   By: Rolla Flatten M.D.   On: 01/02/2014 14:10   Dg Knee Complete 4 Views Right  01/02/2014   CLINICAL DATA:  Knee pain following injury  EXAM: RIGHT KNEE - COMPLETE 4+ VIEW  COMPARISON:  None.  FINDINGS: No acute fracture or dislocation is noted. Mild joint space narrowing is noted medially. No gross soft tissue abnormality is seen.  IMPRESSION: Mild degenerative change without acute abnormality.   Electronically Signed   By: Inez Catalina M.D.   On: 01/02/2014 14:30    Microbiology: No results found for this or any previous visit (from the past 240  hour(s)).   Labs: Basic Metabolic Panel:  Recent Labs Lab 01/02/14 1503 01/02/14 2255 01/03/14 0650  NA 143  --  141  K 4.0  --  4.1  CL 102  --  104  CO2  --   --  24  GLUCOSE 100*  --  99  BUN 21  --  18  CREATININE 1.50* 1.19 1.13  CALCIUM  --   --  8.5  MG  --  2.1  --   PHOS  --  2.1*  --    Liver Function Tests: No results found for this basename: AST, ALT, ALKPHOS, BILITOT, PROT, ALBUMIN,  in the last 168 hours No results found for this basename: LIPASE, AMYLASE,  in the last 168 hours No results found for this basename: AMMONIA,  in the last 168 hours CBC:  Recent Labs Lab 01/02/14 1445 01/02/14 1503 01/02/14 2255 01/03/14 0650  WBC 8.8  --  9.0 8.1  HGB 12.9* 13.9 12.2* 12.4*  HCT 38.3* 41.0 35.6* 36.3*  MCV 97.5  --  97.5 98.4  PLT 193  --  170 168   Cardiac Enzymes: No results found for this basename: CKTOTAL, CKMB, CKMBINDEX, TROPONINI,  in the last 168 hours BNP: BNP (last 3 results) No results found for this basename: PROBNP,  in the last 8760 hours CBG: No results found for this basename: GLUCAP,  in the last 168 hours     Signed:  Cristal Ford  [facility] 01/04/2014, 9:00 AM

## 2014-01-04 NOTE — Care Management Note (Addendum)
  Page 2 of 2   01/04/2014     11:04:02 AM   CARE MANAGEMENT NOTE 01/04/2014  Patient:  Daniel [name], Daniel [name]   Account Number:  [id number]  Date Initiated:  01/04/2014  Documentation initiated by:  Wister Hoefle  Subjective/Objective Assessment:   admitted with Complete Heart Block     Action/Plan:   CM to follow for dispositon needs.   Anticipated DC Date:  01/04/2014   Anticipated DC Plan:  [facility]  In-house referral  Clinical Social Worker      DC Planning Services  CM consult      [facility]  HOME HEALTH   Choice offered to / List presented to:  C-1 Patient        [place] arranged  HH-1 RN  [place].   Status of service:  Completed, signed off Medicare Important Message given?   (If response is "NO", the following Medicare IM given date fields will be blank) Date Medicare IM given:   Date Additional Medicare IM given:    Discharge Disposition:  [place]  Per UR Regulation:  Reviewed for med. necessity/level of care/duration of stay  If discussed at [place] of Stay Meetings, dates discussed:    Comments:  01/04/2014 PT Recs Update: SNF d/t patient with poor memory, home alone, hx/o fx ankle and new heart pacer 01/03/2014. SW referral sent to [facility], [place] [phone] for SNF placement [place], BSN, [place], [place]  11:00am   01/04/2014 Dispositon: Home with HHS:  RN and PT for HSE.  [facility] /donna notified ADD: today 01/04/2014 Leda [place] RN, BSN, [place], CCM 01/04/2014

## 2014-01-05 DIAGNOSIS — N400 Enlarged prostate without lower urinary tract symptoms: Secondary | ICD-10-CM | POA: Diagnosis not present

## 2014-01-05 DIAGNOSIS — I441 Atrioventricular block, second degree: Secondary | ICD-10-CM | POA: Diagnosis not present

## 2014-01-05 DIAGNOSIS — F0390 Unspecified dementia without behavioral disturbance: Secondary | ICD-10-CM | POA: Diagnosis not present

## 2014-01-05 DIAGNOSIS — S8263XA Displaced fracture of lateral malleolus of unspecified fibula, initial encounter for closed fracture: Secondary | ICD-10-CM | POA: Diagnosis not present

## 2014-01-05 DIAGNOSIS — R32 Unspecified urinary incontinence: Secondary | ICD-10-CM | POA: Diagnosis not present

## 2014-01-05 DIAGNOSIS — Z9181 History of falling: Secondary | ICD-10-CM | POA: Diagnosis not present

## 2014-01-05 DIAGNOSIS — F329 Major depressive disorder, single episode, unspecified: Secondary | ICD-10-CM | POA: Diagnosis not present

## 2014-01-05 DIAGNOSIS — Z950 Presence of cardiac pacemaker: Secondary | ICD-10-CM | POA: Diagnosis not present

## 2014-01-05 DIAGNOSIS — R55 Syncope and collapse: Secondary | ICD-10-CM | POA: Diagnosis not present

## 2014-01-05 DIAGNOSIS — J45909 Unspecified asthma, uncomplicated: Secondary | ICD-10-CM | POA: Diagnosis not present

## 2014-01-05 DIAGNOSIS — R269 Unspecified abnormalities of gait and mobility: Secondary | ICD-10-CM | POA: Diagnosis not present

## 2014-01-05 DIAGNOSIS — I5031 Acute diastolic (congestive) heart failure: Secondary | ICD-10-CM | POA: Diagnosis not present

## 2014-01-05 DIAGNOSIS — I10 Essential (primary) hypertension: Secondary | ICD-10-CM | POA: Diagnosis not present

## 2014-01-05 DIAGNOSIS — G309 Alzheimer's disease, unspecified: Secondary | ICD-10-CM | POA: Diagnosis not present

## 2014-01-05 DIAGNOSIS — R279 Unspecified lack of coordination: Secondary | ICD-10-CM | POA: Diagnosis not present

## 2014-01-05 DIAGNOSIS — F0280 Dementia in other diseases classified elsewhere without behavioral disturbance: Secondary | ICD-10-CM | POA: Diagnosis not present

## 2014-01-05 DIAGNOSIS — I369 Nonrheumatic tricuspid valve disorder, unspecified: Secondary | ICD-10-CM

## 2014-01-05 DIAGNOSIS — G40909 Epilepsy, unspecified, not intractable, without status epilepticus: Secondary | ICD-10-CM | POA: Diagnosis not present

## 2014-01-05 DIAGNOSIS — F3289 Other specified depressive episodes: Secondary | ICD-10-CM | POA: Diagnosis not present

## 2014-01-05 DIAGNOSIS — L738 Other specified follicular disorders: Secondary | ICD-10-CM | POA: Diagnosis not present

## 2014-01-05 DIAGNOSIS — I459 Conduction disorder, unspecified: Secondary | ICD-10-CM | POA: Diagnosis not present

## 2014-01-05 DIAGNOSIS — F411 Generalized anxiety disorder: Secondary | ICD-10-CM | POA: Diagnosis not present

## 2014-01-05 DIAGNOSIS — M6281 Muscle weakness (generalized): Secondary | ICD-10-CM | POA: Diagnosis not present

## 2014-01-05 LAB — BASIC METABOLIC PANEL
BUN: 20 mg/dL (ref 6–23)
CALCIUM: 8.7 mg/dL (ref 8.4–10.5)
CO2: 22 meq/L (ref 19–32)
CREATININE: 1.14 mg/dL (ref 0.50–1.35)
Chloride: 99 mEq/L (ref 96–112)
GFR calc Af Amer: 63 mL/min — ABNORMAL LOW (ref >90)
GFR, EST NON AFRICAN AMERICAN: 55 mL/min — AB (ref >90)
Glucose, Bld: 109 mg/dL — ABNORMAL HIGH (ref 70–99)
Potassium: 4.3 mEq/L (ref 3.7–5.3)
SODIUM: 138 meq/L (ref 137–147)

## 2014-01-05 NOTE — Progress Notes (Signed)
[facility]                                                                              Patient Demographics  Daniel [name], is a 80 y.o. male, DOB - 09-Dec-1920, ZOX:[id number]  Admit date - 01/02/2014   Admitting Physician Costin Karlyne Greenspan, MD  Outpatient Primary MD for the patient is [name], [name], MD  LOS - 3   Chief Complaint  Patient presents with  . Fall        Assessment & Plan   Second Degree heart block  -Cardiology has been consulted  -Implantation of new dual chamber permanent pacemaker 01/03/14  -Echocardiogram pending, those results can be followed up as an outpatient  -TSH 0.747, magnesium 2.1, phosphorus 2.1  -Patient is stable for discharge from cardiac standpoint  Left malleolus fracture  -Orthopedics consulted and patient placed in a Cam Walker boot  -Will follow as outpatient 3-4 weeks   Alzheimer's disease  -Continue Namenda and Aricept   BPH  -Urinary retention, currently on no medication  -Will continue to monitor   GERD  -Continue PPI   Hypertension  -Currently controlled with diet, on no medications we'll continue to monitor   Hyperlipidemia  -On no medications, likely diet-controlled   Allergic rhinitis and asthma  -Continue Flonase   Depression and anxiety  -Continue Zoloft   Increased ocular pressure  -Continue eyedrops  Code Status: Full  Family Communication: Daughter at bedside  Disposition Plan: Patient will be discharged today, discharge was held yesterday due to to echocardiogram will be performed. Patient will be [facility] to [facility] facility.  Time Spent in minutes   20 minutes  Procedures  Implantation of new dual chamber permanent pacemaker  Echocardiogram  Consults   Cardiology Orthopedics  DVT Prophylaxis  Lovenox  Lab Results  Component Value Date   PLT 168 01/03/2014    Medications  Scheduled Meds: . aspirin EC  81 mg Oral Daily  . cholecalciferol  1,000 Units Oral Daily   . donepezil  10 mg Oral QHS  . fluticasone  2 spray Each Nare Daily  . heparin  5,000 Units Subcutaneous 3 times per day  . LORazepam  1 mg Intravenous Once  . memantine  10 mg Oral BID  . mirabegron ER  50 mg Oral QHS  . multivitamin-lutein  1 capsule Oral Daily  . olopatadine  1 drop Both Eyes BID  . pantoprazole  40 mg Oral Daily  . sertraline  25 mg Oral Daily  . sertraline  50 mg Oral QHS  . sodium chloride  3 mL Intravenous Q12H   Continuous Infusions: . sodium chloride 50 mL/hr at 01/02/14 1807   PRN Meds:.acetaminophen, acetaminophen, ondansetron (ZOFRAN) IV, ondansetron, oxyCODONE  Antibiotics    Anti-infectives   Start     Dose/Rate Route Frequency Ordered Stop   01/03/14 1900  ceFAZolin (ANCEF) IVPB 1 g/50 mL premix     1 g 100 mL/hr over 30 Minutes Intravenous Every 6 hours 01/03/14 1704 01/04/14 0257   01/03/14 1600  ceFAZolin (ANCEF) IVPB 1 g/50 mL premix  Status:  Discontinued     1 g 100 mL/hr over 30 Minutes Intravenous  Every 6 hours 01/03/14 1520 01/03/14 1704   01/03/14 1100  gentamicin (GARAMYCIN) 80 mg in sodium chloride irrigation 0.9 % 500 mL irrigation  Status:  Discontinued     80 mg Irrigation On call 01/03/14 1047 01/03/14 1520   01/03/14 1100  ceFAZolin (ANCEF) IVPB 2 g/50 mL premix  Status:  Discontinued     2 g 100 mL/hr over 30 Minutes Intravenous On call 01/03/14 1047 01/03/14 1520   01/03/14 0600  gentamicin (GARAMYCIN) 80 mg in sodium chloride irrigation 0.9 % 500 mL irrigation  Status:  Discontinued     80 mg Irrigation On call 01/02/14 2018 01/03/14 1109   01/03/14 0600  ceFAZolin (ANCEF) IVPB 2 g/50 mL premix  Status:  Discontinued     2 g 100 mL/hr over 30 Minutes Intravenous On call 01/02/14 2018 01/03/14 1109        Subjective:   Daniel [name] seen and examined today. Patient has no complaints.    Objective:   Filed Vitals:   01/04/14 0948 01/04/14 1407 01/04/14 2144 01/05/14 0652  BP: 142/64 116/46 111/50 133/47  Pulse: 62  59 56 59  Temp:  98.1 F (36.7 C) 98.1 F (36.7 C) 98.1 F (36.7 C)  TempSrc:  Oral Oral Oral  Resp:  20 18 17   Height:      Weight:    67.9 kg (149 lb 11.1 oz)  SpO2: 99% 99% 98% 97%    Wt Readings from Last 3 Encounters:  01/05/14 67.9 kg (149 lb 11.1 oz)  01/05/14 67.9 kg (149 lb 11.1 oz)  11/21/13 71.578 kg (157 lb 12.8 oz)     Intake/Output Summary (Last 24 hours) at 01/05/14 0745 Last data filed at 01/05/14 0657  Gross per 24 hour  Intake    960 ml  Output   1300 ml  Net   -340 ml    Exam  General: Well developed, well nourished, NAD, appears stated age  HEENT: NCAT, mucous membranes moist.  Neck: Supple, no JVD, no masses  Cardiovascular: S1 S2 auscultated, Regular rate and rhythm.  Respiratory: Clear to auscultation bilaterally with equal chest rise  Abdomen: Soft, nontender, nondistended, + bowel sounds  Extremities: warm dry without cyanosis clubbing or edema, left lower extremity in boot  Neuro: Awake and alert, no focal deficit  Skin: Without rashes exudates or nodules  Psych: Normal affect and demeanor  Data Review   Micro Results No results found for this or any previous visit (from the past 240 hour(s)).  Radiology Reports Dg Chest 2 View  01/02/2014   CLINICAL DATA:  80 year old who fell.  EXAM: CHEST  2 VIEW  COMPARISON:  DG ABDOMEN ACUTE 2V W/ 1V CHEST dated 08/23/2013; DG CHEST 2 VIEW dated 04/09/2009  FINDINGS: The heart size and mediastinal contours are stable. There are slightly lower lung volumes with mild resulting bibasilar atelectasis. No edema, confluent airspace opacity, pleural effusion or pneumothorax is identified. The bones are demineralized. There is a questionable nondisplaced fracture of the right fourth rib posteriorly. No other acute osseous findings are evident.  IMPRESSION: Possible nondisplaced fracture of the right fourth rib posteriorly-correlate clinically. No pneumothorax or significant pleural effusion.    Electronically Signed   By: Camie Patience M.D.   On: 01/02/2014 14:27   Dg Ankle Complete Left  01/02/2014   CLINICAL DATA:  Left ankle pain and swelling after falling.  EXAM: LEFT ANKLE COMPLETE - 3+ VIEW  COMPARISON:  None.  FINDINGS: The bones are demineralized.  There is a vague transverse lucency projecting across the lateral malleolus on the AP and oblique views which could reflect a nondisplaced fracture. There is moderate adjacent lateral soft tissue swelling. No widening of the ankle mortise is identified. The distal tibia and talus are intact. Scattered vascular calcifications are noted.  IMPRESSION: Possible nondisplaced transverse fracture of the lateral malleolus with adjacent soft tissue swelling.   Electronically Signed   By: Camie Patience M.D.   On: 01/02/2014 14:30   Ct Head Wo Contrast  01/02/2014   CLINICAL DATA:  Fall, head pain, neck pain.  Alzheimer's dementia.  EXAM: CT HEAD WITHOUT CONTRAST  CT CERVICAL SPINE WITHOUT CONTRAST  TECHNIQUE: Multidetector CT imaging of the head and cervical spine was performed following the standard protocol without intravenous contrast. Multiplanar CT image reconstructions of the cervical spine were also generated.  COMPARISON:  None.  FINDINGS: CT HEAD FINDINGS  Advanced atrophy with chronic microvascular ischemic change. No visible acute stroke, hydrocephalus, or extra-axial fluid. No skull fracture or intracranial hemorrhage. Advanced vascular calcification. No visible acute sinus or mastoid fluid.  CT CERVICAL SPINE FINDINGS  There is no visible cervical spine fracture, traumatic subluxation, prevertebral soft tissue swelling, or intraspinal hematoma. Osseous demineralization. Disc space narrowing C5-C6. Degenerative change associated with C1-2 articulation without significant pannus. Moderate facet arthropathy C3-C4 on the right. Carotid atherosclerosis. No pneumothorax.  IMPRESSION: Age related senescent changes. No skull fracture or intracranial  hemorrhage.  No cervical spine fracture or traumatic subluxation.   Electronically Signed   By: Rolla Flatten M.D.   On: 01/02/2014 14:10   Ct Cervical Spine Wo Contrast  01/02/2014   CLINICAL DATA:  Fall, head pain, neck pain.  Alzheimer's dementia.  EXAM: CT HEAD WITHOUT CONTRAST  CT CERVICAL SPINE WITHOUT CONTRAST  TECHNIQUE: Multidetector CT imaging of the head and cervical spine was performed following the standard protocol without intravenous contrast. Multiplanar CT image reconstructions of the cervical spine were also generated.  COMPARISON:  None.  FINDINGS: CT HEAD FINDINGS  Advanced atrophy with chronic microvascular ischemic change. No visible acute stroke, hydrocephalus, or extra-axial fluid. No skull fracture or intracranial hemorrhage. Advanced vascular calcification. No visible acute sinus or mastoid fluid.  CT CERVICAL SPINE FINDINGS  There is no visible cervical spine fracture, traumatic subluxation, prevertebral soft tissue swelling, or intraspinal hematoma. Osseous demineralization. Disc space narrowing C5-C6. Degenerative change associated with C1-2 articulation without significant pannus. Moderate facet arthropathy C3-C4 on the right. Carotid atherosclerosis. No pneumothorax.  IMPRESSION: Age related senescent changes. No skull fracture or intracranial hemorrhage.  No cervical spine fracture or traumatic subluxation.   Electronically Signed   By: Rolla Flatten M.D.   On: 01/02/2014 14:10   Dg Knee Complete 4 Views Right  01/02/2014   CLINICAL DATA:  Knee pain following injury  EXAM: RIGHT KNEE - COMPLETE 4+ VIEW  COMPARISON:  None.  FINDINGS: No acute fracture or dislocation is noted. Mild joint space narrowing is noted medially. No gross soft tissue abnormality is seen.  IMPRESSION: Mild degenerative change without acute abnormality.   Electronically Signed   By: Inez Catalina M.D.   On: 01/02/2014 14:30    CBC  Recent Labs Lab 01/02/14 1445 01/02/14 1503 01/02/14 2255  01/03/14 0650  WBC 8.8  --  9.0 8.1  HGB 12.9* 13.9 12.2* 12.4*  HCT 38.3* 41.0 35.6* 36.3*  PLT 193  --  170 168  MCV 97.5  --  97.5 98.4  MCH 32.8  --  33.4  33.6  MCHC 33.7  --  34.3 34.2  RDW 13.4  --  13.5 13.5    Chemistries   Recent Labs Lab 01/02/14 1503 01/02/14 2255 01/03/14 0650 01/05/14 0448  NA 143  --  141 138  K 4.0  --  4.1 4.3  CL 102  --  104 99  CO2  --   --  24 22  GLUCOSE 100*  --  99 109*  BUN 21  --  18 20  CREATININE 1.50* 1.19 1.13 1.14  CALCIUM  --   --  8.5 8.7  MG  --  2.1  --   --    ------------------------------------------------------------------------------------------------------------------ estimated creatinine clearance is 41.4 ml/min (by C-G formula based on Cr of 1.14). ------------------------------------------------------------------------------------------------------------------ No results found for this basename: HGBA1C,  in the last 72 hours ------------------------------------------------------------------------------------------------------------------ No results found for this basename: CHOL, HDL, LDLCALC, TRIG, CHOLHDL, LDLDIRECT,  in the last 72 hours ------------------------------------------------------------------------------------------------------------------  Recent Labs  01/02/14 2255  TSH 0.747   ------------------------------------------------------------------------------------------------------------------ No results found for this basename: VITAMINB12, FOLATE, FERRITIN, TIBC, IRON, RETICCTPCT,  in the last 72 hours  Coagulation profile  Recent Labs Lab 01/03/14 1212  INR 1.03    No results found for this basename: DDIMER,  in the last 72 hours  Cardiac Enzymes No results found for this basename: CK, CKMB, TROPONINI, MYOGLOBIN,  in the last 168 hours ------------------------------------------------------------------------------------------------------------------ No components found with this  basename: POCBNP,     Sovereign Ramiro D.O. on 01/05/2014 at 7:45 AM  Between 7am to 7pm - Pager - [phone]  After 7pm go to [email or web address] - password [email or web address]  And look for the night coverage person covering for me after hours  [facility] Office  [phone]

## 2014-01-05 NOTE — Progress Notes (Addendum)
Patient ID: Daniel [name], male   DOB: 30-Aug-1921, 80 y.o.   MRN: [id number]    SUBJECTIVE:  Social work was able to arrange for patient to go to a nursing home bed today. He is stable. Renal function is stable today.   Filed Vitals:   01/04/14 1407 01/04/14 2144 01/05/14 0652 01/05/14 1056  BP: 116/46 111/50 133/47 119/50  Pulse: 59 56 59 59  Temp: 98.1 F (36.7 C) 98.1 F (36.7 C) 98.1 F (36.7 C) 98 F (36.7 C)  TempSrc: Oral Oral Oral Oral  Resp: 20 18 17 18   Height:      Weight:   149 lb 11.1 oz (67.9 kg)   SpO2: 99% 98% 97% 95%     Intake/Output Summary (Last 24 hours) at 01/05/14 1120 Last data filed at 01/05/14 0840  Gross per 24 hour  Intake    960 ml  Output   1150 ml  Net   -190 ml    LABS: Basic Metabolic Panel:  Recent Labs  01/02/14 1503 01/02/14 2255 01/03/14 0650 01/05/14 0448  NA 143  --  141 138  K 4.0  --  4.1 4.3  CL 102  --  104 99  CO2  --   --  24 22  GLUCOSE 100*  --  99 109*  BUN 21  --  18 20  CREATININE 1.50* 1.19 1.13 1.14  CALCIUM  --   --  8.5 8.7  MG  --  2.1  --   --   PHOS  --  2.1*  --   --    Liver Function Tests: No results found for this basename: AST, ALT, ALKPHOS, BILITOT, PROT, ALBUMIN,  in the last 72 hours No results found for this basename: LIPASE, AMYLASE,  in the last 72 hours CBC:  Recent Labs  01/02/14 2255 01/03/14 0650  WBC 9.0 8.1  HGB 12.2* 12.4*  HCT 35.6* 36.3*  MCV 97.5 98.4  PLT 170 168   Cardiac Enzymes: No results found for this basename: CKTOTAL, CKMB, CKMBINDEX, TROPONINI,  in the last 72 hours BNP: No components found with this basename: POCBNP,  D-Dimer: No results found for this basename: DDIMER,  in the last 72 hours Hemoglobin A1C: No results found for this basename: HGBA1C,  in the last 72 hours Fasting Lipid Panel: No results found for this basename: CHOL, HDL, LDLCALC, TRIG, CHOLHDL, LDLDIRECT,  in the last 72 hours Thyroid Function Tests:  Recent Labs  01/02/14 2255  TSH  0.747    RADIOLOGY: Dg Chest 2 View  01/04/2014   CLINICAL DATA:  Status post pacemaker placement ; arm discomfort  EXAM: CHEST  2 VIEW  COMPARISON:  DG CHEST 2 VIEW dated 01/02/2014  FINDINGS: The lungs are adequately inflated. There is no focal infiltrate. Minimal prominence of the interstitial markings is present and is slightly more conspicuous than on the previous study. The cardiac silhouette is top-normal in size but stable. The pulmonary vascularity is mildly prominent today centrally. The permanent pacemaker appears appropriately positioned. There is no pneumothorax or pleural effusion. The observed portions of the bony thorax appear normal.  IMPRESSION: Since the earlier study there has been there has developed mild pulmonary vascular congestion which may reflect low grade CHF or interstitial edema of noncardiac cause. There is no evidence of a pneumothorax or pleural effusion.   Electronically Signed   By: David  Martinique   On: 01/04/2014 08:26   Dg Chest 2 View  01/02/2014  CLINICAL DATA:  80 year old who fell.  EXAM: CHEST  2 VIEW  COMPARISON:  DG ABDOMEN ACUTE 2V W/ 1V CHEST dated 08/23/2013; DG CHEST 2 VIEW dated 04/09/2009  FINDINGS: The heart size and mediastinal contours are stable. There are slightly lower lung volumes with mild resulting bibasilar atelectasis. No edema, confluent airspace opacity, pleural effusion or pneumothorax is identified. The bones are demineralized. There is a questionable nondisplaced fracture of the right fourth rib posteriorly. No other acute osseous findings are evident.  IMPRESSION: Possible nondisplaced fracture of the right fourth rib posteriorly-correlate clinically. No pneumothorax or significant pleural effusion.   Electronically Signed   By: Camie Patience M.D.   On: 01/02/2014 14:27   Dg Ankle Complete Left  01/02/2014   CLINICAL DATA:  Left ankle pain and swelling after falling.  EXAM: LEFT ANKLE COMPLETE - 3+ VIEW  COMPARISON:  None.  FINDINGS: The  bones are demineralized. There is a vague transverse lucency projecting across the lateral malleolus on the AP and oblique views which could reflect a nondisplaced fracture. There is moderate adjacent lateral soft tissue swelling. No widening of the ankle mortise is identified. The distal tibia and talus are intact. Scattered vascular calcifications are noted.  IMPRESSION: Possible nondisplaced transverse fracture of the lateral malleolus with adjacent soft tissue swelling.   Electronically Signed   By: Camie Patience M.D.   On: 01/02/2014 14:30   Ct Head Wo Contrast  01/02/2014   CLINICAL DATA:  Fall, head pain, neck pain.  Alzheimer's dementia.  EXAM: CT HEAD WITHOUT CONTRAST  CT CERVICAL SPINE WITHOUT CONTRAST  TECHNIQUE: Multidetector CT imaging of the head and cervical spine was performed following the standard protocol without intravenous contrast. Multiplanar CT image reconstructions of the cervical spine were also generated.  COMPARISON:  None.  FINDINGS: CT HEAD FINDINGS  Advanced atrophy with chronic microvascular ischemic change. No visible acute stroke, hydrocephalus, or extra-axial fluid. No skull fracture or intracranial hemorrhage. Advanced vascular calcification. No visible acute sinus or mastoid fluid.  CT CERVICAL SPINE FINDINGS  There is no visible cervical spine fracture, traumatic subluxation, prevertebral soft tissue swelling, or intraspinal hematoma. Osseous demineralization. Disc space narrowing C5-C6. Degenerative change associated with C1-2 articulation without significant pannus. Moderate facet arthropathy C3-C4 on the right. Carotid atherosclerosis. No pneumothorax.  IMPRESSION: Age related senescent changes. No skull fracture or intracranial hemorrhage.  No cervical spine fracture or traumatic subluxation.   Electronically Signed   By: Rolla Flatten M.D.   On: 01/02/2014 14:10   Ct Cervical Spine Wo Contrast  01/02/2014   CLINICAL DATA:  Fall, head pain, neck pain.  Alzheimer's  dementia.  EXAM: CT HEAD WITHOUT CONTRAST  CT CERVICAL SPINE WITHOUT CONTRAST  TECHNIQUE: Multidetector CT imaging of the head and cervical spine was performed following the standard protocol without intravenous contrast. Multiplanar CT image reconstructions of the cervical spine were also generated.  COMPARISON:  None.  FINDINGS: CT HEAD FINDINGS  Advanced atrophy with chronic microvascular ischemic change. No visible acute stroke, hydrocephalus, or extra-axial fluid. No skull fracture or intracranial hemorrhage. Advanced vascular calcification. No visible acute sinus or mastoid fluid.  CT CERVICAL SPINE FINDINGS  There is no visible cervical spine fracture, traumatic subluxation, prevertebral soft tissue swelling, or intraspinal hematoma. Osseous demineralization. Disc space narrowing C5-C6. Degenerative change associated with C1-2 articulation without significant pannus. Moderate facet arthropathy C3-C4 on the right. Carotid atherosclerosis. No pneumothorax.  IMPRESSION: Age related senescent changes. No skull fracture or intracranial hemorrhage.  No cervical spine  fracture or traumatic subluxation.   Electronically Signed   By: Rolla Flatten M.D.   On: 01/02/2014 14:10   Dg Knee Complete 4 Views Right  01/02/2014   CLINICAL DATA:  Knee pain following injury  EXAM: RIGHT KNEE - COMPLETE 4+ VIEW  COMPARISON:  None.  FINDINGS: No acute fracture or dislocation is noted. Mild joint space narrowing is noted medially. No gross soft tissue abnormality is seen.  IMPRESSION: Mild degenerative change without acute abnormality.   Electronically Signed   By: Inez Catalina M.D.   On: 01/02/2014 14:30    PHYSICAL EXAM cardiac exam reveals scattered rhonchi. There is an S1 and S2. There is trace peripheral edema.   TELEMETRY: Patient is not currently on telemetry.   ASSESSMENT AND PLAN:    Alzheimer's disease    Patient is to be transferred to a skilled nursing facility.    BPH (benign prostatic hyperplasia)    GERD (gastroesophageal reflux disease)   HTN (hypertension)   Other and unspecified hyperlipidemia   Asthma   Heart block AV second degree    Cardiac pacemaker implanted- [employer] - 01/03/14     Pacemaker is in place and stable.    Acute diastolic heart failure    Echo was done this morning. There is normal left ventricular function. No further cardiac workup is needed.  The patient can be discharged home. We are arranging his post hospital pacemaker check.   Dola Argyle 01/05/2014 11:20 AM

## 2014-01-05 NOTE — Progress Notes (Signed)
  Echocardiogram 2D Echocardiogram has been performed.  Daniel [name] 01/05/2014, 10:39 AM

## 2014-01-05 NOTE — Progress Notes (Signed)
Pt pulled PIV out, right hand bruised, pt oriented to person only at this time, family member at bedside. Pt refused IV reinsertion. Fredirick Maudlin NP notified. Will continue to monitor.

## 2014-01-05 NOTE — Progress Notes (Signed)
Pt discharged by ambulance to [facility].  Pt's family in room and aware.  Please reconsult CSW if further needs arise.   Apolonio Schneiders (weekend [place])  [phone]

## 2014-01-05 NOTE — Progress Notes (Signed)
Pt. Discharged to [facility]. VSS family aware at the bedside. Report called to [facility] home.

## 2014-01-07 NOTE — Clinical Social Work Placement (Signed)
     Clinical Social Work Department CLINICAL SOCIAL WORK PLACEMENT NOTE 01/07/2014  Patient:  [name], Daniel  Account Number:  [id number] Admit date:  01/02/2014  Clinical Social Worker:  Crawford Givens, LCSW  Date/time:  01/04/2014 06:55 AM  Clinical Social Work is seeking post-discharge placement for this patient at the following level of care:   [place]   (*CSW will update this form in Epic as items are completed)   01/04/2014  Patient/family provided with [facility] Department of Clinical Social Works list of facilities offering this level of care within the geographic area requested by the patient (or if unable, by the patients family).  01/04/2014  Patient/family informed of their freedom to choose among providers that offer the needed level of care, that participate in Medicare, Medicaid or managed care program needed by the patient, have an available bed and are willing to accept the patient.    Patient/family informed of [facility] ownership interest in [facility], as well as of the fact that they are under no obligation to receive care at this facility.  PASARR submitted to EDS on 01/04/2014 PASARR number received from EDS on 01/04/2014  FL2 transmitted to all facilities in geographic area requested by pt/family on  01/04/2014 FL2 transmitted to all facilities within larger geographic area on   Patient informed that his/her managed care company has contracts with or will negotiate with  certain facilities, including the following:     Patient/family informed of bed offers received:  01/04/2014 Patient chooses bed at [facility] Physician recommends and patient chooses bed at    Patient to be transferred to [facility] on  01/05/2014 Patient to be transferred to facility by ambulance  The following physician request were entered in Epic:   Additional Comments:

## 2014-01-09 ENCOUNTER — Telehealth: Payer: Self-pay | Admitting: Neurology

## 2014-01-09 DIAGNOSIS — F329 Major depressive disorder, single episode, unspecified: Secondary | ICD-10-CM | POA: Diagnosis not present

## 2014-01-09 DIAGNOSIS — F0390 Unspecified dementia without behavioral disturbance: Secondary | ICD-10-CM | POA: Diagnosis not present

## 2014-01-09 DIAGNOSIS — R32 Unspecified urinary incontinence: Secondary | ICD-10-CM | POA: Diagnosis not present

## 2014-01-09 DIAGNOSIS — R55 Syncope and collapse: Secondary | ICD-10-CM | POA: Diagnosis not present

## 2014-01-09 NOTE — Telephone Encounter (Signed)
FYI--Patient's daughter Mateo Flow calling to inform us patient fell after walking a mile and fractured lt. ankle and rt. rib--had to have pacemaker implanted because his heart rate was so low--patient had appt for 4-3 but cancelled and will call back after leaving [facility] where he is having rehab.

## 2014-01-09 NOTE — Telephone Encounter (Signed)
Events noted, the family is to contact us to reschedule the appointment.

## 2014-01-09 NOTE — Telephone Encounter (Signed)
[name] Mateo Flow calling stating that pt fell after walking a mile and fractured his ankle and rt rib and had to have pacemaker implanted because his heart rate was so low, pt had an appt for 4/3 and cancelled and will call back after leaving [facility] where he is having rehab. FYI

## 2014-01-11 ENCOUNTER — Ambulatory Visit: Payer: Medicare Other | Admitting: Neurology

## 2014-01-13 ENCOUNTER — Ambulatory Visit (INDEPENDENT_AMBULATORY_CARE_PROVIDER_SITE_OTHER): Payer: Medicare Other | Admitting: Internal Medicine

## 2014-01-13 VITALS — BP 110/62 | HR 60 | Temp 98.1°F | Resp 16

## 2014-01-13 DIAGNOSIS — L678 Other hair color and hair shaft abnormalities: Secondary | ICD-10-CM | POA: Diagnosis not present

## 2014-01-13 DIAGNOSIS — L738 Other specified follicular disorders: Secondary | ICD-10-CM | POA: Diagnosis not present

## 2014-01-13 DIAGNOSIS — L739 Follicular disorder, unspecified: Secondary | ICD-10-CM

## 2014-01-13 MED ORDER — DOXYCYCLINE HYCLATE 100 MG PO TABS: 100.0000 mg | ORAL_TABLET | Freq: Two times a day (BID) | ORAL | Status: DC

## 2014-01-13 NOTE — Progress Notes (Signed)
Subjective:    Patient ID: Daniel [name], male    DOB: 14-Jan-1921, 80 y.o.   MRN: [id number]  Rash Pertinent negatives include no fever.   Chief Complaint  Patient presents with  . Rash   This chart was scribed for Daniel Lin, MD by Thea Alken, ED Scribe. This patient was seen in room 1 and the patient's care was started at 2:01 PM.  HPI Comments: Daniel [name] is a 80 y.o. male with h/o dementia who presents to the [facility] complaining of a red, itchy rash located on entire back. Pt daughter reports that pt got the rash while in the hospital. She believes that he was possibly allergic to bed linen in the hospital. Lying mostly supine w/ no bathing x 5 days.  Daughter denies patient laying on the linen without a shirt. She reports that pt sweats a lot. Daughter reports that the pt has been agony due rash. Kenalog cream--per Dr Lemmie Evens at [facility]-- with minor relief to itch. Pt reports that the itch keeps him up at night.  Pt was recently hospitalized for a left leg injury and was seen at [facility]. Pt daughter reports that pt broke left ankle and rib due to a fall. Pt daughter reports that he had just finished walking a mile prior to fall.  She reports that the pt has been attending physical therapy every day. She reports that pt also received pacemaker while in the hospital.     Past Medical History  Diagnosis Date  . Memory loss   . OCD (obsessive compulsive disorder)   . Dysphagia     Mild  . Gastroesophageal reflux disease   . Degenerative arthritis   . Dyslipidemia   . Hearing loss   . Diverticulitis   . Left bundle branch block   . Nocturnal leg cramps   . Allergy   . Seizures   . Substance abuse   . Ulcer   . Cataract   . Anxiety   . Hypertension   . Hearing difficulty     bilateral hearing aids   Allergies  Allergen Reactions  . Sanctura [Trospium]     Seizures, body spasms  . Biaxin [Clarithromycin] Hives   Prior to Admission  medications   Medication Sig Start Date End Date Taking? Authorizing Provider  aspirin 81 MG tablet Take 81 mg by mouth daily.   Yes Historical Provider, MD  cholecalciferol (VITAMIN D) 1000 UNITS tablet Take 1,000 Units by mouth daily.   Yes Historical Provider, MD  donepezil (ARICEPT) 10 MG tablet Take 10 mg by mouth at bedtime.    Yes Historical Provider, MD  fluticasone (FLONASE) 50 MCG/ACT nasal spray Place 2 sprays into the nose daily.   Yes Historical Provider, MD  memantine (NAMENDA) 10 MG tablet Take 1 tablet (10 mg total) by mouth 2 (two) times daily. 08/17/13  Yes Kathrynn Ducking, MD  mirabegron ER (MYRBETRIQ) 50 MG TB24 tablet Take 50 mg by mouth daily.   Yes Historical Provider, MD  Multiple Vitamin (MULTIVITAMIN) capsule Take 1 capsule by mouth daily.   Yes Historical Provider, MD  Multiple Vitamins-Minerals (PRESERVISION AREDS) TABS Take 1 tablet by mouth daily.   Yes Historical Provider, MD  olopatadine (PATANOL) 0.1 % ophthalmic solution 1 drop 2 (two) times daily.   Yes Historical Provider, MD  omeprazole (PRILOSEC) 20 MG capsule Take 20 mg by mouth every morning.   Yes Historical Provider, MD  sertraline (ZOLOFT) 25  MG tablet Take 25-50 mg by mouth 2 (two) times daily. 25 mg in the morning and 50 mg at night   Yes Historical Provider, MD  oxyCODONE (OXY IR/ROXICODONE) 5 MG immediate release tablet Take 1 tablet (5 mg total) by mouth every 6 (six) hours as needed for moderate pain. 01/04/14   Maryann Mikhail, DO   Review of Systems  Constitutional: Negative for fever, appetite change and unexpected weight change.  Cardiovascular: Negative for chest pain.  Gastrointestinal: Negative for abdominal pain.  Genitourinary: Negative for difficulty urinating.  Skin: Positive for rash.  Neurological: Negative for dizziness and weakness.  Hematological: Does not bruise/bleed easily.  Psychiatric/Behavioral: Positive for sleep disturbance. Negative for dysphoric mood and decreased  concentration.      Objective:   Physical Exam  Nursing note and vitals reviewed. Constitutional: He is oriented to person, place, and time. He appears well-developed and well-nourished. No distress.  HENT:  Head: Normocephalic and atraumatic.  Eyes: EOM are normal. Pupils are equal, round, and reactive to light.  Neck: Neck supple. No thyromegaly present.  Cardiovascular: Normal rate, regular rhythm and normal heart sounds.   No murmur heard. Pulmonary/Chest: Effort normal and breath sounds normal. No respiratory distress.  Abdominal: He exhibits no distension and no mass. There is no tenderness. There is no rebound.  Musculoskeletal: Normal range of motion. He exhibits no edema and no tenderness.  Lymphadenopathy:    He has no cervical adenopathy.  Neurological: He is alert and oriented to person, place, and time. He has normal reflexes. No cranial nerve deficit.  Skin: Skin is warm and dry. Rash noted.  Psychiatric: He has a normal mood and affect.  thoug cont decr by dementia   Filed Vitals:   01/13/14 1330  BP: 110/62  Pulse: 60  Temp: 98.1 F (36.7 C)  Resp: 16      Assessment & Plan:   I have completed the patient encounter in its entirety as documented by the scribe, with editing by me where necessary. Marquesha Robideau P. Laney Pastor, M.D.  Folliculitis ok for topicals at rehab   Meds ordered this encounter  Medications  . Multiple Vitamin (MULTIVITAMIN) capsule    Sig: Take 1 capsule by mouth daily.  Marland Kitchen doxycycline (VIBRA-TABS) 100 MG tablet    Sig: Take 1 tablet (100 mg total) by mouth 2 (two) times daily.    Dispense:  20 tablet    Refill:  0

## 2014-01-15 ENCOUNTER — Encounter: Payer: Self-pay | Admitting: Cardiology

## 2014-01-15 ENCOUNTER — Ambulatory Visit (INDEPENDENT_AMBULATORY_CARE_PROVIDER_SITE_OTHER): Payer: Medicare Other | Admitting: Cardiology

## 2014-01-15 VITALS — BP 113/54 | HR 60 | Ht 68.0 in | Wt 150.0 lb

## 2014-01-15 DIAGNOSIS — I441 Atrioventricular block, second degree: Secondary | ICD-10-CM

## 2014-01-15 NOTE — Patient Instructions (Signed)
If steri-strips have not come off in 3 days they can be removed manually.  Your physician recommends that you schedule a follow-up appointment in: 4 weeks with Dr. Sallyanne Kuster.

## 2014-01-17 ENCOUNTER — Encounter: Payer: Self-pay | Admitting: Cardiology

## 2014-01-17 NOTE — Assessment & Plan Note (Signed)
Heart block led to a fall with a fractured foot.  He had a St. Jude device placed by Dr. Sallyanne Kuster.  Site is healing well staples removed patient will follow with Dr. Sallyanne Kuster for pacer interrogation in 4 weeks.

## 2014-01-17 NOTE — Progress Notes (Signed)
01/17/2014   PCP: Leandrew Koyanagi, MD   Chief Complaint  Patient presents with  . Follow-up    Staple removal. Patient fractured left ankle and right rib. C/o soreness around incision.    Primary Cardiologist:  Dr. Sallyanne Kuster  HPI:  80 y.o. male with a past medical history significant for excellent health, with recent fatigue, exertional dyspnea and possible syncope. Usually walks 1 mile in 17 minutes, twice daily. Recently breathless doing it. Fell at home and suspected to have briefly lost consciousness yesterday.His memory is poor and his daughter provides a lot of the history. Despite dementia, he lives in his own home and she checks on him frequently. Physically, he is very functional and independent. Telemetry showed second degree atrioventricular block Mobitz type II and bradycardia. He underwent placement of a St Jude Pacemaker 01/03/14.   At discharge he was sent to SNF for rehab due to fx of foot.    No chest pain, no SOB.  Pacer site with dressing in place and removed. Plan is for patient to return back to his normal home.   Allergies  Allergen Reactions  . Sanctura [Trospium]     Seizures, body spasms  . Biaxin [Clarithromycin] Hives    Current Outpatient Prescriptions  Medication Sig Dispense Refill  . aspirin 81 MG tablet Take 81 mg by mouth daily.      . cholecalciferol (VITAMIN D) 1000 UNITS tablet Take 1,000 Units by mouth daily.      Marland Kitchen donepezil (ARICEPT) 10 MG tablet Take 10 mg by mouth at bedtime.       Marland Kitchen doxycycline (VIBRA-TABS) 100 MG tablet Take 1 tablet (100 mg total) by mouth 2 (two) times daily.  20 tablet  0  . fluticasone (FLONASE) 50 MCG/ACT nasal spray Place 2 sprays into the nose daily.      . memantine (NAMENDA) 10 MG tablet Take 1 tablet (10 mg total) by mouth 2 (two) times daily.  180 tablet  1  . mirabegron ER (MYRBETRIQ) 50 MG TB24 tablet Take 50 mg by mouth daily.      . Multiple Vitamin (MULTIVITAMIN) capsule Take 1 capsule by  mouth daily.      . Multiple Vitamins-Minerals (PRESERVISION AREDS) TABS Take 1 tablet by mouth daily.      Marland Kitchen olopatadine (PATANOL) 0.1 % ophthalmic solution 1 drop 2 (two) times daily.      Marland Kitchen omeprazole (PRILOSEC) 20 MG capsule Take 20 mg by mouth every morning.      . sertraline (ZOLOFT) 25 MG tablet Take 25-50 mg by mouth 2 (two) times daily. 25 mg in the morning and 50 mg at night       No current facility-administered medications for this visit.    Past Medical History  Diagnosis Date  . Memory loss   . OCD (obsessive compulsive disorder)   . Dysphagia     Mild  . Gastroesophageal reflux disease   . Degenerative arthritis   . Dyslipidemia   . Hearing loss   . Diverticulitis   . Left bundle branch block   . Nocturnal leg cramps   . Allergy   . Seizures   . Substance abuse   . Ulcer   . Cataract   . Anxiety   . Hypertension   . Hearing difficulty     bilateral hearing aids    Past Surgical History  Procedure Laterality Date  . Tonsillectomy    . Turp  vaporization    . Cataract extraction Bilateral   . Basal cell carcinoma excision    . Prostate surgery    . Eye surgery      VJK:QASUORV:IF colds or fevers, no weight changes Skin:no rashes or ulcers CV:see HPI PUL:see HPI GI:no diarrhea constipation or melena, no indigestion GU:no hematuria, no dysuria  PHYSICAL EXAM BP 113/54  Pulse 60  Ht 5\' 8"  (1.727 m)  Wt 150 lb (68.04 kg)  BMI 22.81 kg/m2 General:Pleasant affect, NAD Skin:Warm and dry, brisk capillary refill Heart:S1S2 RRR without murmur, gallup, rub or click Lungs:clear without rales, rhonchi, or wheezes  Left anterior chest and the site well approximated no drainage he has ecchymosis of his left anterior chest wall that is fading. Site clean with Betadine Staples removed Steri-Strips applied to the medial aspect of the incision and family instructed to remove those in 2-3 days if they do not fall off.  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  He had a St. Jude device placed by Dr. Sallyanne Kuster.  Site is healing well staples removed patient will follow with Dr. Sallyanne Kuster for pacer interrogation in 4 weeks.   Answered all questions for pt and daughter.

## 2014-01-24 DIAGNOSIS — M25579 Pain in unspecified ankle and joints of unspecified foot: Secondary | ICD-10-CM | POA: Diagnosis not present

## 2014-01-25 DIAGNOSIS — Z85828 Personal history of other malignant neoplasm of skin: Secondary | ICD-10-CM | POA: Diagnosis not present

## 2014-01-25 DIAGNOSIS — L253 Unspecified contact dermatitis due to other chemical products: Secondary | ICD-10-CM | POA: Diagnosis not present

## 2014-01-26 ENCOUNTER — Telehealth: Payer: Self-pay

## 2014-01-26 NOTE — Telephone Encounter (Signed)
[name] came by to see if we can refill his Omeprazole 20 mg. She would like it called in the the [place] on [facility]. Please call Mateo Flow @ [phone]

## 2014-01-28 MED ORDER — OMEPRAZOLE 20 MG PO CPDR: 20.0000 mg | DELAYED_RELEASE_CAPSULE | ORAL | Status: DC

## 2014-01-28 NOTE — Telephone Encounter (Signed)
done

## 2014-01-28 NOTE — Telephone Encounter (Signed)
Pt hasn't been seen for GERD since Sept 2014, but has been in more recently for check-up. Can we RF until next f/up due? Pended.

## 2014-01-29 ENCOUNTER — Ambulatory Visit: Payer: Medicare Other | Admitting: Neurology

## 2014-01-29 NOTE — Telephone Encounter (Signed)
Notified Valerie on VM that Pt's Rx was sent.

## 2014-02-04 ENCOUNTER — Other Ambulatory Visit: Payer: Self-pay

## 2014-02-04 MED ORDER — MEMANTINE HCL 10 MG PO TABS: 10.0000 mg | ORAL_TABLET | Freq: Two times a day (BID) | ORAL | Status: DC

## 2014-02-08 DIAGNOSIS — H353190 Nonexudative age-related macular degeneration, unspecified eye, stage unspecified: Secondary | ICD-10-CM | POA: Diagnosis not present

## 2014-02-14 ENCOUNTER — Other Ambulatory Visit: Payer: Self-pay

## 2014-02-14 MED ORDER — SERTRALINE HCL 25 MG PO TABS: ORAL_TABLET | ORAL | Status: DC

## 2014-02-17 ENCOUNTER — Ambulatory Visit (INDEPENDENT_AMBULATORY_CARE_PROVIDER_SITE_OTHER): Payer: Medicare Other | Admitting: Family Medicine

## 2014-02-17 VITALS — BP 138/80 | HR 62 | Temp 98.1°F | Resp 16 | Ht 68.0 in | Wt 154.0 lb

## 2014-02-17 DIAGNOSIS — D649 Anemia, unspecified: Secondary | ICD-10-CM | POA: Diagnosis not present

## 2014-02-17 DIAGNOSIS — R1032 Left lower quadrant pain: Secondary | ICD-10-CM | POA: Diagnosis not present

## 2014-02-17 DIAGNOSIS — R82998 Other abnormal findings in urine: Secondary | ICD-10-CM | POA: Diagnosis not present

## 2014-02-17 DIAGNOSIS — Z8719 Personal history of other diseases of the digestive system: Secondary | ICD-10-CM

## 2014-02-17 DIAGNOSIS — R8290 Unspecified abnormal findings in urine: Secondary | ICD-10-CM

## 2014-02-17 LAB — POCT URINALYSIS DIPSTICK
Bilirubin, UA: NEGATIVE
Glucose, UA: NEGATIVE
Ketones, UA: NEGATIVE
Nitrite, UA: NEGATIVE
Protein, UA: NEGATIVE
Spec Grav, UA: 1.02
Urobilinogen, UA: 0.2
pH, UA: 6.5

## 2014-02-17 LAB — POCT CBC
Granulocyte percent: 55.8 %G (ref 37–80)
HCT, POC: 45.7 % (ref 43.5–53.7)
Hemoglobin: 14.5 g/dL (ref 14.1–18.1)
Lymph, poc: 2.7 (ref 0.6–3.4)
MCH, POC: 32.7 pg — AB (ref 27–31.2)
MCHC: 31.7 g/dL — AB (ref 31.8–35.4)
MCV: 103.1 fL — AB (ref 80–97)
MID (cbc): 0.6 (ref 0–0.9)
MPV: 8.3 fL (ref 0–99.8)
POC Granulocyte: 4.2 (ref 2–6.9)
POC LYMPH PERCENT: 35.9 % (ref 10–50)
POC MID %: 8.3 %M (ref 0–12)
Platelet Count, POC: 272 10*3/uL (ref 142–424)
RBC: 4.43 M/uL — AB (ref 4.69–6.13)
RDW, POC: 14.4 %
WBC: 7.5 10*3/uL (ref 4.6–10.2)

## 2014-02-17 LAB — POCT UA - MICROSCOPIC ONLY
Bacteria, U Microscopic: NEGATIVE
Casts, Ur, LPF, POC: NEGATIVE
Crystals, Ur, HPF, POC: NEGATIVE
Epithelial cells, urine per micros: NEGATIVE
Mucus, UA: NEGATIVE
Yeast, UA: NEGATIVE

## 2014-02-17 MED ORDER — CIPROFLOXACIN HCL 500 MG PO TABS: 500.0000 mg | ORAL_TABLET | Freq: Two times a day (BID) | ORAL | Status: DC

## 2014-02-17 MED ORDER — METRONIDAZOLE 500 MG PO TABS: 500.0000 mg | ORAL_TABLET | Freq: Two times a day (BID) | ORAL | Status: DC

## 2014-02-17 NOTE — Progress Notes (Signed)
Chief Complaint:  Chief Complaint  Patient presents with  . Abdominal Pain    x 7 days, left side only now started on right    HPI: Daniel [name] is a 80 y.o. male who is here for LLQ abd pain, he states that it started on the right and now is on the left, it started 1 week ago. He has a hx of diverticular disease and also a supra pubic hernia in the past . He has had not urinary sxs that are new, he denies any increase freq/urgency that is not normal for him. He has has no nausea or vomiting. It  Has not impacted him from eating, he deneis fevers or chills or diarrhea. He has been in rehab for a broken ankle s/p syncope several weeks ago s/p heart block now with a pacemaker. During rehab he had to get up and down and use more of his abd muscles per his daughter. He has dementiaand is a poor historian.   Past Medical History  Diagnosis Date  . Memory loss   . OCD (obsessive compulsive disorder)   . Dysphagia     Mild  . Gastroesophageal reflux disease   . Degenerative arthritis   . Dyslipidemia   . Hearing loss   . Diverticulitis   . Left bundle branch block   . Nocturnal leg cramps   . Allergy   . Seizures   . Substance abuse   . Ulcer   . Cataract   . Anxiety   . Hypertension   . Hearing difficulty     bilateral hearing aids   Past Surgical History  Procedure Laterality Date  . Tonsillectomy    . Turp vaporization    . Cataract extraction Bilateral   . Basal cell carcinoma excision    . Prostate surgery    . Eye surgery     History   Social History  . Marital Status: Widowed    Spouse Name: N/A    Number of Children: N/A  . Years of Education: N/A   Occupational History  . Retired    Social History Main Topics  . Smoking status: Former Research scientist (life sciences)  . Smokeless tobacco: Never Used  . Alcohol Use: No     Comment: History of alcoholism, no longer drinking  . Drug Use: No  . Sexual Activity: No   Other Topics Concern  . None   Social History  Narrative   Widow. Education: [employer]. Exercise walking 2 times a day for 20 minutes.   Family History  Problem Relation Age of Onset  . Heart attack Mother   . Dementia Father   . Heart attack Sister   . Heart disease Sister   . Heart attack Brother   . Heart attack Sister   . Heart attack Brother   . Stroke Brother   . Heart disease Daughter   . Heart disease Paternal Grandmother    Allergies  Allergen Reactions  . Sanctura [Trospium]     Seizures, body spasms  . Biaxin [Clarithromycin] Hives   Prior to Admission medications   Medication Sig Start Date End Date Taking? Authorizing Provider  aspirin 81 MG tablet Take 81 mg by mouth daily.   Yes Historical Provider, MD  cholecalciferol (VITAMIN D) 1000 UNITS tablet Take 1,000 Units by mouth daily.   Yes Historical Provider, MD  donepezil (ARICEPT) 10 MG tablet Take 10 mg by mouth at bedtime.    Yes Historical Provider, MD  fluticasone (FLONASE) 50 MCG/ACT nasal spray Place 2 sprays into the nose daily.   Yes Historical Provider, MD  memantine (NAMENDA) 10 MG tablet Take 1 tablet (10 mg total) by mouth 2 (two) times daily. 02/04/14  Yes Kathrynn Ducking, MD  mirabegron ER (MYRBETRIQ) 50 MG TB24 tablet Take 50 mg by mouth daily.   Yes Historical Provider, MD  Multiple Vitamins-Minerals (PRESERVISION AREDS) TABS Take 1 tablet by mouth daily.   Yes Historical Provider, MD  Olopatadine HCl (PATADAY) 0.2 % SOLN Apply to eye.   Yes Historical Provider, MD  omeprazole (PRILOSEC) 20 MG capsule Take 1 capsule (20 mg total) by mouth every morning. 01/28/14  Yes Leandrew Koyanagi, MD  sertraline (ZOLOFT) 25 MG tablet 25 mg in the morning and 50 mg at night 02/14/14  Yes Kathrynn Ducking, MD  doxycycline (VIBRA-TABS) 100 MG tablet Take 1 tablet (100 mg total) by mouth 2 (two) times daily. 01/13/14   Leandrew Koyanagi, MD  Multiple Vitamin (MULTIVITAMIN) capsule Take 1 capsule by mouth daily.    Historical Provider, MD  olopatadine (PATANOL) 0.1 %  ophthalmic solution 1 drop 2 (two) times daily.    Historical Provider, MD     ROS: The patient denies fevers, chills, night sweats, unintentional weight loss, chest pain, palpitations, wheezing, dyspnea on exertion,  vomiting,  dysuria, hematuria, melena, numbness, weakness, or tingling.   All other systems have been reviewed and were otherwise negative with the exception of those mentioned in the HPI and as above.    PHYSICAL EXAM: Filed Vitals:   02/17/14 1603  BP: 138/80  Pulse: 62  Temp: 98.1 F (36.7 C)  Resp: 16   Filed Vitals:   02/17/14 1603  Height: 5\' 8"  (1.727 m)  Weight: 154 lb (69.854 kg)   Body mass index is 23.42 kg/(m^2).  General: Alert, no acute distress HEENT:  Normocephalic, atraumatic, oropharynx patent. EOMI, PERRLA Cardiovascular:  Regular rate and rhythm, no rubs murmurs or gallops.  No Carotid bruits, radial pulse intact. No pedal edema.  Respiratory: Clear to auscultation bilaterally.  No wheezes, rales, or rhonchi.  No cyanosis, no use of accessory musculature GI: No organomegaly, abdomen is soft and minimally-tender when I palpate on left side, he seems to have a left inguinal hernia bulge which I reduced, positive bowel sounds.  No masses. Skin: No rashes. Neurologic: Facial musculature symmetric. Psychiatric: Patient is appropriate throughout our interaction. Lymphatic: No cervical lymphadenopathy Musculoskeletal: Gait intact.   LABS: Results for orders placed in visit on 02/17/14  POCT CBC      Result Value Ref Range   WBC 7.5  4.6 - 10.2 K/uL   Lymph, poc 2.7  0.6 - 3.4   POC LYMPH PERCENT 35.9  10 - 50 %L   MID (cbc) 0.6  0 - 0.9   POC MID % 8.3  0 - 12 %M   POC Granulocyte 4.2  2 - 6.9   Granulocyte percent 55.8  37 - 80 %G   RBC 4.43 (*) 4.69 - 6.13 M/uL   Hemoglobin 14.5  14.1 - 18.1 g/dL   HCT, POC 45.7  43.5 - 53.7 %   MCV 103.1 (*) 80 - 97 fL   MCH, POC 32.7 (*) 27 - 31.2 pg   MCHC 31.7 (*) 31.8 - 35.4 g/dL   RDW, POC 14.4      Platelet Count, POC 272  142 - 424 K/uL   MPV 8.3  0 - 99.8 fL  POCT UA - MICROSCOPIC ONLY      Result Value Ref Range   WBC, Ur, HPF, POC 0-3     RBC, urine, microscopic 0-2     Bacteria, U Microscopic neg     Mucus, UA neg     Epithelial cells, urine per micros neg     Crystals, Ur, HPF, POC neg     Casts, Ur, LPF, POC neg     Yeast, UA neg    POCT URINALYSIS DIPSTICK      Result Value Ref Range   Color, UA yellow     Clarity, UA clear     Glucose, UA neg     Bilirubin, UA neg     Ketones, UA neg     Spec Grav, UA 1.020     Blood, UA trace-intact     pH, UA 6.5     Protein, UA neg     Urobilinogen, UA 0.2     Nitrite, UA neg     Leukocytes, UA Trace       EKG/XRAY:   Primary read interpreted by Dr. Marin Comment at [facility].   ASSESSMENT/PLAN: Encounter Diagnoses  Name Primary?  . Abdominal pain, left lower quadrant Yes  . History of diverticulitis   . History of abdominal hernia   . Anemia    Will get CT scan of abd and pelvis for rule out diverticular vs abdominal/inguinal hernia I think I was able to reduce the inguinal hernia, he appeared to have no more pain after reduction He as able to walk around sit and also go from supine to sitting without complaints like he did when he initially came in I gave them precautions about abd pain , worsening pain Rx for flagyl and also cipro given in event diverticular disease, hold unless pain continue and or n/v/diarrhea with food He was recently dc from hosptial and was on levaquin UA showed trace leuks, most likely contaminant and not UTI but will culture.  Anemia improved CMP pending  Gross sideeffects, risk and benefits, and alternatives of medications d/w patient. Patient is aware that all medications have potential sideeffects and we are unable to predict every sideeffect or drug-drug interaction that may occur.  Glenford Bayley, DO 02/17/2014 5:52 PM

## 2014-02-18 ENCOUNTER — Telehealth: Payer: Self-pay

## 2014-02-18 DIAGNOSIS — R1032 Left lower quadrant pain: Secondary | ICD-10-CM

## 2014-02-18 LAB — COMPREHENSIVE METABOLIC PANEL
ALT: 20 U/L (ref 0–53)
AST: 31 U/L (ref 0–37)
Alkaline Phosphatase: 76 U/L (ref 39–117)
Creat: 1.2 mg/dL (ref 0.50–1.35)
Glucose, Bld: 101 mg/dL — ABNORMAL HIGH (ref 70–99)
Potassium: 4.7 mEq/L (ref 3.5–5.3)
Sodium: 139 mEq/L (ref 135–145)
Total Bilirubin: 0.5 mg/dL (ref 0.2–1.2)
Total Protein: 7.3 g/dL (ref 6.0–8.3)

## 2014-02-18 LAB — COMPREHENSIVE METABOLIC PANEL WITH GFR
Albumin: 4.6 g/dL (ref 3.5–5.2)
BUN: 13 mg/dL (ref 6–23)
CO2: 30 meq/L (ref 19–32)
Calcium: 9.5 mg/dL (ref 8.4–10.5)
Chloride: 100 meq/L (ref 96–112)

## 2014-02-18 NOTE — Telephone Encounter (Signed)
Message copied by Dallas Schimke on Mon Feb 18, 2014 10:10 AM ------      Message from: Rikki Spearing P      Created: Mon Feb 18, 2014  9:17 AM       CAn we get him a stat CT for today or tomorrow for rule out diverticular disease/abdominal hernia/inguonal hernia. HAs LLQ abd pain            Call daughter, he has dementia ------

## 2014-02-18 NOTE — Telephone Encounter (Signed)
I didn't realize Dr Marin Comment had already ordered CT and called [facility] to see which CT to order. Cancelled duplicate order when discovered Dr Gus Puma order. Alerted Butch Penny to STAT order and she stated she will take care of scheduling.

## 2014-02-18 NOTE — Telephone Encounter (Signed)
Daniel [name], see note below to contact daughter when scheduled.

## 2014-02-19 ENCOUNTER — Other Ambulatory Visit: Payer: Self-pay

## 2014-02-19 ENCOUNTER — Telehealth: Payer: Self-pay

## 2014-02-19 ENCOUNTER — Ambulatory Visit
Admission: RE | Admit: 2014-02-19 | Discharge: 2014-02-19 | Disposition: A | Discharge status: Home / Self Care | Payer: Medicare Other | Source: Ambulatory Visit | Attending: Family Medicine | Admitting: Family Medicine

## 2014-02-19 DIAGNOSIS — R1032 Left lower quadrant pain: Secondary | ICD-10-CM

## 2014-02-19 DIAGNOSIS — K5730 Diverticulosis of large intestine without perforation or abscess without bleeding: Secondary | ICD-10-CM | POA: Diagnosis not present

## 2014-02-19 LAB — URINE CULTURE: Colony Count: 4000

## 2014-02-19 MED ORDER — IOHEXOL 300 MG/ML  SOLN
100.0000 mL | Freq: Once | INTRAMUSCULAR | Status: AC | PRN
  Administered 2014-02-19: 100 mL via INTRAVENOUS

## 2014-02-19 MED ORDER — DONEPEZIL HCL 10 MG PO TABS: 10.0000 mg | ORAL_TABLET | Freq: Every day | ORAL | Status: DC

## 2014-02-19 NOTE — Telephone Encounter (Signed)
Can you change that for me. Thanks. Tle

## 2014-02-19 NOTE — Telephone Encounter (Signed)
[facility] called to advise that for pt's Dx and to r/o diverticular disease/hernia, they only need to do a CT Abd Pelvis With Contrast, not W/WO. I have entered new order as requested. Dr Marin Comment, Juluis Rainier.

## 2014-02-20 ENCOUNTER — Telehealth: Payer: Self-pay | Admitting: Family Medicine

## 2014-02-20 ENCOUNTER — Ambulatory Visit (INDEPENDENT_AMBULATORY_CARE_PROVIDER_SITE_OTHER): Payer: Medicare Other | Admitting: Cardiovascular Disease

## 2014-02-20 ENCOUNTER — Encounter: Payer: Self-pay | Admitting: Cardiovascular Disease

## 2014-02-20 VITALS — BP 122/63 | HR 60 | Resp 16 | Ht 68.0 in | Wt 154.4 lb

## 2014-02-20 DIAGNOSIS — I5031 Acute diastolic (congestive) heart failure: Secondary | ICD-10-CM

## 2014-02-20 DIAGNOSIS — I441 Atrioventricular block, second degree: Secondary | ICD-10-CM | POA: Diagnosis not present

## 2014-02-20 DIAGNOSIS — R55 Syncope and collapse: Secondary | ICD-10-CM

## 2014-02-20 DIAGNOSIS — I10 Essential (primary) hypertension: Secondary | ICD-10-CM

## 2014-02-20 LAB — PACEMAKER DEVICE OBSERVATION

## 2014-02-20 NOTE — Progress Notes (Signed)
Patient ID: TARVARES LANT, male   DOB: Sep 14, 1921, 80 y.o.   MRN: [id number]     Reason for office visit Followup heart block, diastolic heart failure, pacemaker implantation  Mr. Devol is a 80 year old gentleman who presented with an acute diastolic heart failure in the setting of high-grade second-degree AV block and bradycardia roughly 5 weeks ago. He has had substantial improvement following implantation of a dual-chamber permanent pacemaker. He no longer has any signs or symptoms of congestive heart failure and has not required diuretic therapy. Interrogation of his pacemaker shows normal device function. The surgical site has healed well. He has roughly 44% atrial pacing due to sinus bradycardia as well as 100% ventricular pacing do to AV block. His device has not reported any episodes of significant tachyarrhythmia. Lead parameters are normal.   He has had a recent evaluation for abdominal pain that shows a recurrent left inguinal hernia that was successfully reduced. CT of the abdomen incidentally showed atherosclerotic aortoiliac disease. He has a long-standing history of hypercholesterolemia and takes Crestor for years. This medication was stopped as his cognitive decline progressed.   Allergies  Allergen Reactions  . Sanctura [Trospium]     Seizures, body spasms  . Biaxin [Clarithromycin] Hives    Current Outpatient Prescriptions  Medication Sig Dispense Refill  . aspirin 81 MG tablet Take 81 mg by mouth daily.      . cholecalciferol (VITAMIN D) 1000 UNITS tablet Take 1,000 Units by mouth daily.      Marland Kitchen donepezil (ARICEPT) 10 MG tablet Take 1 tablet (10 mg total) by mouth at bedtime.  90 tablet  1  . fluticasone (FLONASE) 50 MCG/ACT nasal spray Place 2 sprays into the nose daily.      . memantine (NAMENDA) 10 MG tablet Take 1 tablet (10 mg total) by mouth 2 (two) times daily.  180 tablet  0  . mirabegron ER (MYRBETRIQ) 50 MG TB24 tablet Take 50 mg by mouth daily.      . Multiple  Vitamins-Minerals (PRESERVISION AREDS) TABS Take 1 tablet by mouth daily.      Marland Kitchen olopatadine (PATANOL) 0.1 % ophthalmic solution 1 drop 2 (two) times daily.      Marland Kitchen omeprazole (PRILOSEC) 20 MG capsule Take 1 capsule (20 mg total) by mouth every morning.  30 capsule  3  . sertraline (ZOLOFT) 25 MG tablet 25 mg in the morning and 50 mg at night  270 tablet  1   No current facility-administered medications for this visit.    Past Medical History  Diagnosis Date  . Memory loss   . OCD (obsessive compulsive disorder)   . Dysphagia     Mild  . Gastroesophageal reflux disease   . Degenerative arthritis   . Dyslipidemia   . Hearing loss   . Diverticulitis   . Left bundle branch block   . Nocturnal leg cramps   . Allergy   . Seizures   . Substance abuse   . Ulcer   . Cataract   . Anxiety   . Hypertension   . Hearing difficulty     bilateral hearing aids    Past Surgical History  Procedure Laterality Date  . Tonsillectomy    . Turp vaporization    . Cataract extraction Bilateral   . Basal cell carcinoma excision    . Prostate surgery    . Eye surgery      Family History  Problem Relation Age of Onset  . Heart attack Mother   .  Dementia Father   . Heart attack Sister   . Heart disease Sister   . Heart attack Brother   . Heart attack Sister   . Heart attack Brother   . Stroke Brother   . Heart disease Daughter   . Heart disease Paternal Grandmother     History   Social History  . Marital Status: Widowed    Spouse Name: N/A    Number of Children: N/A  . Years of Education: N/A   Occupational History  . Retired    Social History Main Topics  . Smoking status: Former Research scientist (life sciences)  . Smokeless tobacco: Never Used  . Alcohol Use: No     Comment: History of alcoholism, no longer drinking  . Drug Use: No  . Sexual Activity: No   Other Topics Concern  . Not on file   Social History Narrative   Widow. Education: [employer]. Exercise walking 2 times a day for 20  minutes.    Review of systems: The patient specifically denies any chest pain at rest or with exertion, dyspnea at rest or with exertion, orthopnea, paroxysmal nocturnal dyspnea, syncope, palpitations, focal neurological deficits, intermittent claudication, lower extremity edema, unexplained weight gain, cough, hemoptysis or wheezing.  The patient also denies abdominal pain, nausea, vomiting, dysphagia, diarrhea, constipation, polyuria, polydipsia, dysuria, hematuria, frequency, urgency, abnormal bleeding or bruising, fever, chills, unexpected weight changes, mood swings, change in skin or hair texture, change in voice quality, auditory or visual problems, allergic reactions or rashes, new musculoskeletal complaints other than usual "aches and pains".   PHYSICAL EXAM BP 122/63  Pulse 60  Resp 16  Ht 5\' 8"  (1.727 m)  Wt 154 lb 6.4 oz (70.035 kg)  BMI 23.48 kg/m2  General: Alert, oriented x3, no distress Head: no evidence of trauma, PERRL, EOMI, no exophtalmos or lid lag, no myxedema, no xanthelasma; normal ears, nose and oropharynx Neck: normal jugular venous pulsations and no hepatojugular reflux; brisk carotid pulses without delay and no carotid bruits Chest: clear to auscultation, no signs of consolidation by percussion or palpation, normal fremitus, symmetrical and full respiratory excursions. Healed pacemaker site Cardiovascular: normal position and quality of the apical impulse, regular rhythm, normal first and second heart sounds, no murmurs, rubs or gallops Abdomen: no tenderness or distention, no masses by palpation, no abnormal pulsatility or arterial bruits, normal bowel sounds, no hepatosplenomegaly Extremities: no clubbing, cyanosis or edema; 2+ radial, ulnar and brachial pulses bilaterally; 2+ right femoral, posterior tibial and dorsalis pedis pulses; 2+ left femoral, posterior tibial and dorsalis pedis pulses; no subclavian or femoral bruits Neurological: grossly  nonfocal    Lipid Panel  No results found for this basename: chol, trig, hdl, cholhdl, vldl, ldlcalc    BMET    Component Value Date/Time   NA 139 02/17/2014 1745   K 4.7 02/17/2014 1745   CL 100 02/17/2014 1745   CO2 30 02/17/2014 1745   GLUCOSE 101* 02/17/2014 1745   BUN 13 02/17/2014 1745   CREATININE 1.20 02/17/2014 1745   CREATININE 1.14 01/05/2014 0448   CALCIUM 9.5 02/17/2014 1745   GFRNONAA 55* 01/05/2014 0448   GFRAA 63* 01/05/2014 0448     ASSESSMENT AND PLAN  Normal pacemaker function. He is not pacemaker dependent it is likely to become so as time progresses. He no longer has any signs or symptoms of congestive heart failure. He had normal left ventricular systolic function. No changes are made to pacemaker settings today. He'll be enrolled in remotes pacemaker transmissions. The  presence of PAD is noted but since he is asymptomatic further evaluation and treatment is not recommended at this time. No treatment for his hyperlipidemia is recommended secondary to his age and progressive dementia, at the family's request.  Lilyannah Zuelke  Sanda Klein, MD, [place] [phone] office [phone] pager

## 2014-02-20 NOTE — Patient Instructions (Signed)
Remote monitoring is used to monitor your pacemaker from home. This monitoring reduces the number of office visits required to check your device to one time per year. It allows [place] to keep an eye on the functioning of your device to ensure it is working properly. You are scheduled for a device check from home on 05-23-2014. You may send your transmission at any time that day. If you have a wireless device, the transmission will be sent automatically. After your physician reviews your transmission, you will receive a postcard with your next transmission date.  Your physician recommends that you schedule a follow-up appointment in: 12 months with Dr.Croitoru

## 2014-02-20 NOTE — Telephone Encounter (Signed)
LM about CT scan fordaughter, she can call me back with update. He has dementia.

## 2014-02-20 NOTE — Telephone Encounter (Signed)
Spoke to pt daughter about CT scan results

## 2014-02-21 ENCOUNTER — Telehealth: Payer: Self-pay | Admitting: Neurology

## 2014-02-21 DIAGNOSIS — R82998 Other abnormal findings in urine: Secondary | ICD-10-CM | POA: Diagnosis not present

## 2014-02-21 DIAGNOSIS — N3941 Urge incontinence: Secondary | ICD-10-CM | POA: Diagnosis not present

## 2014-02-21 NOTE — Telephone Encounter (Signed)
Daughter called and stated patient needs earlier appointment, he's scheduled for 9/2. Patient has fallen and fractured wrist and ribs.  He's had an pacemaker placed and she feels his dementia has worsened.  Please call and advise.

## 2014-02-22 NOTE — Telephone Encounter (Signed)
I called the patient, talked with the daughter. The patient has had a fall recently in March 2015, fractured foot. The patient was found to have a low heart rate, and required a pacemaker/defibrillator. During this hospitalization and subsequent rehabilitation stay, the patient had what sounds like a delirium state, increased confusion. This has led to a drop-off in the cognitive functioning level. He is back on, more calm, but he also is more confused. The patient will come back in for a revisit, this type of cognitive change following an intercurrent illnesses is common. The patient may not return to his baseline state.

## 2014-02-27 ENCOUNTER — Encounter: Payer: Self-pay | Admitting: Adult Health

## 2014-02-27 ENCOUNTER — Ambulatory Visit (INDEPENDENT_AMBULATORY_CARE_PROVIDER_SITE_OTHER): Payer: Medicare Other | Admitting: Adult Health

## 2014-02-27 VITALS — BP 135/65 | HR 60 | Temp 98.6°F | Ht 68.0 in | Wt 156.0 lb

## 2014-02-27 DIAGNOSIS — F0280 Dementia in other diseases classified elsewhere without behavioral disturbance: Secondary | ICD-10-CM | POA: Diagnosis not present

## 2014-02-27 DIAGNOSIS — G309 Alzheimer's disease, unspecified: Secondary | ICD-10-CM | POA: Diagnosis not present

## 2014-02-27 NOTE — Patient Instructions (Signed)
Alzheimer Disease Alzheimer Disease (AD) is a mental disorder. It causes memory loss and loss of other mental functions, such as learning, thinking, solving problems, communicating, and completing tasks. The mental losses interfere with the ability to perform daily activities at work, at home, or in social situations. AD usually starts in the late 60s or early 22s but can start earlier in life (familial form). The mental changes caused by AD are permanent and worsen over time. As the illness progresses, the ability to do even the simplest things is lost. Survival with AD ranges from several years to as long as 20 years. CAUSES AD is caused by abnormally high levels of a protein (beta-amyloid) in the brain. This protein forms very small deposits within and around the brain's nerve cells. These deposits prevent the nerve cells from working properly. Experts are not certain what causes the beta-amyloid deposits in AD. RISK FACTORS The following major risk factors have been identified:  Increasing age.  Certain genetic variations, such as Down syndrome (trisomy 21). SYMPTOMS The earliest mental change in AD is mild memory loss of recent events, names, or phone numbers. Other symptoms at the beginning of AD include loss of objects, minor loss of vocabulary, and difficulty with complex tasks, such as paying bills or driving in unfamiliar locations. At this stage, you are still able to perform daily activities but need greater effort, more time, or memory aids. Other mental functions deteriorate as AD worsens. These changes slowly go from mild to severe. Symptoms at this stage include:  Difficulty remembering You may not be able to recall personal information such as your address and telephone number. You may become confused about the date, the season of the year, or your location.  Difficulty maintaining attention You may forget what you wanted to say during conversations and repeat what you have already  said.  Difficulty learning new information or tasks You may not remember what you read or the name of a new friend you met.  Difficulty counting or doing math You may have difficulty with complex math problems. You may make mistakes in paying bills or managing your checkbook.  Poor reasoning and judgment You may make poor decisions or not dress right for the weather.  Difficulty communicating You may have regular difficulty remembering words, naming objects, expressing yourself clearly, or writing sentences that make sense.  Difficulty performing familiar daily activities You may get lost driving in familiar locations or need help eating, bathing, dressing, grooming, or using the toilet. You may have difficulty maintaining bladder or bowel control.  Difficulty recognizing familiar faces You may confuse family members or close friends with one another. You may not recognize a close relative or may mistake strangers for family. AD also may cause changes in personality and behavior. These changes include loss of interest or motivation, social withdrawal, anxiety, difficulty sleeping, uncharacteristic anger or combativeness, a false belief that someone is trying to harm you (paranoia), seeing things that are not real (hallucinations), or agitation. Confusion and disruptive behavior are often worse at night and may be triggered by changes in the environment or acute medical issues. DIAGNOSIS  AD is diagnosed through an assessment by your health care provider. During this assessment, your health care provider will do the following:  Ask you and your family, friends, or caregiver questions about your symptoms, their frequency, their duration and progression, and the effect they are having on your life.  Ask questions about your personal and family medical history and use  of alcohol or drugs, including prescription medicine.  Perform a physical exam and order blood tests and brain imaging exams. Your  health care provider may refer you to a specialist for detailed evaluation of your mental functions (neuropsychological testing).  Many different brain disorders, medical conditions, and certain substances can cause symptoms that resemble AD symptoms. These must be ruled out before AD can be diagnosed. If AD is diagnosed, it will be considered either "possible" or "probable" AD. "Possible" AD means that your symptoms are typical of AD and no other disorder is causing them. "Probable" AD means that you also have a family history of AD or genetic test results that support the diagnosis. Certain tests, mostly used in research studies, are highly specific for AD.  TREATMENT  There is currently no cure for AD. The goals of treatment are to:  Slow down the progression of the disease.  Preserve mental function as long as possible.  Manage behavioral symptoms.  Make life easier for the person with AD and their caregivers. The following treatment options are available:  Medicine Certain medicines may help slow memory loss by changing the level of certain chemicals in the brain. Medicine may also help with behavioral symptoms.  Talk therapy Talk therapy provides education, support, and memory aids for people with AD. It is most effective in the early stages of the illness.  Caregiving Caregivers may be family members, friends, or trained medical professionals. They help the person with AD with daily life activities. Caregiving may take place at home or at a nursing facility.  Family support groups These provide education, emotional support, and information about community resources to family members who are taking care of the person with AD. Document Released: 06/08/2004 Document Revised: 05/30/2013 Document Reviewed: 02/02/2013 [employer] Patient Information 2014 [employer].  

## 2014-02-27 NOTE — Progress Notes (Signed)
I have read the note, and I agree with the clinical assessment and plan.  Daimien Patmon K Gethsemane Fischler   

## 2014-02-27 NOTE — Progress Notes (Signed)
PATIENT: Daniel [name] DOB: 1921/01/31  REASON FOR VISIT: follow up HISTORY FROM: patient  HISTORY OF PRESENT ILLNESS: Daniel [name] is a 80 year old right-handed white male with a history of a progressive memory disorder. Patient fell in March 2015 and fractured his foot. While in the hospital he had a low heart rate and a pacemaker was placed. He also became delirious while in the hospital and according to his daughter he pulled out his IVs and foley catheter. From the hospital, he went to a rehab facility and there his confusion increased. While at rehab he had folliculitis and was put on doxycycline. He is now back at home and is no longer agitated but his memory is not back to his baseline according to his daughter. Patient is on Aricept and Namenda for memory. Patient lives alone in a [place] but his daughter lives in the [place] beside of him and checks on him frequently. Patient makes his bed every morning. He will cook frozen dinners for himself. He washes, dries and puts away all of his clothes. He makes his own grocery list. He has to be reminded to take a shower, normally only likes to shower 3 times a week. Does not operate a motor vehicle. Patient returns today for an evaluation.   REVIEW OF SYSTEMS: Full 14 system review of systems performed and notable only for:  Constitutional: activity change, appetite change  Eyes: eye discharge, eye itching, eye redness, light sensitivity,  Ear/Nose/Throat: hearing loss, runny nose Skin: itching  Cardiovascular: N/A  Respiratory: N/A  Gastrointestinal: abdominal pain, incontinence of bowels  Genitourinary: incontinence of bladder, urgency Hematology/Lymphatic: N/A  Endocrine: cold intolerance Musculoskeletal: joint pain, joint swelling  Allergy/Immunology: N/A  Neurological: memory loss, speech difficulty Psychiatric: agitation, confusion, decreased concentration, nervous/anxious Sleep: N/A   ALLERGIES: Allergies  Allergen Reactions  .  Sanctura [Trospium]     Seizures, body spasms  . Biaxin [Clarithromycin] Hives    HOME MEDICATIONS: Outpatient Prescriptions Prior to Visit  Medication Sig Dispense Refill  . aspirin 81 MG tablet Take 81 mg by mouth daily.      . cholecalciferol (VITAMIN D) 1000 UNITS tablet Take 1,000 Units by mouth daily.      Marland Kitchen donepezil (ARICEPT) 10 MG tablet Take 1 tablet (10 mg total) by mouth at bedtime.  90 tablet  1  . fluticasone (FLONASE) 50 MCG/ACT nasal spray Place 2 sprays into the nose daily.      . memantine (NAMENDA) 10 MG tablet Take 1 tablet (10 mg total) by mouth 2 (two) times daily.  180 tablet  0  . mirabegron ER (MYRBETRIQ) 50 MG TB24 tablet Take 50 mg by mouth daily.      . Multiple Vitamins-Minerals (PRESERVISION AREDS) TABS Take 1 tablet by mouth daily.      Marland Kitchen olopatadine (PATANOL) 0.1 % ophthalmic solution 1 drop 2 (two) times daily.      Marland Kitchen omeprazole (PRILOSEC) 20 MG capsule Take 1 capsule (20 mg total) by mouth every morning.  30 capsule  3  . sertraline (ZOLOFT) 25 MG tablet 25 mg in the morning and 50 mg at night  270 tablet  1   No facility-administered medications prior to visit.    PAST MEDICAL HISTORY: Past Medical History  Diagnosis Date  . Memory loss   . OCD (obsessive compulsive disorder)   . Dysphagia     Mild  . Gastroesophageal reflux disease   . Degenerative arthritis   . Dyslipidemia   .  Hearing loss   . Diverticulitis   . Left bundle branch block   . Nocturnal leg cramps   . Allergy   . Seizures   . Substance abuse   . Ulcer   . Cataract   . Anxiety   . Hypertension   . Hearing difficulty     bilateral hearing aids    PAST SURGICAL HISTORY: Past Surgical History  Procedure Laterality Date  . Tonsillectomy    . Turp vaporization    . Cataract extraction Bilateral   . Basal cell carcinoma excision    . Prostate surgery    . Eye surgery      FAMILY HISTORY: Family History  Problem Relation Age of Onset  . Heart attack Mother   .  Dementia Father   . Heart attack Sister   . Heart disease Sister   . Heart attack Brother   . Heart attack Sister   . Heart attack Brother   . Stroke Brother   . Heart disease Daughter   . Heart disease Paternal Grandmother     SOCIAL HISTORY: History   Social History  . Marital Status: Widowed    Spouse Name: N/A    Number of Children: N/A  . Years of Education: N/A   Occupational History  . Retired    Social History Main Topics  . Smoking status: Former Research scientist (life sciences)  . Smokeless tobacco: Never Used  . Alcohol Use: No     Comment: History of alcoholism, no longer drinking  . Drug Use: No  . Sexual Activity: No   Other Topics Concern  . Not on file   Social History Narrative   Widow. Education: [employer]. Exercise walking 2 times a day for 20 minutes.      PHYSICAL EXAM  Filed Vitals:   02/27/14 1436  BP: 135/65  Pulse: 60  Temp: 98.6 F (37 C)  TempSrc: Oral  Height: 5\' 8"  (1.727 m)  Weight: 156 lb (70.761 kg)   Body mass index is 23.73 kg/(m^2).  Generalized: Well developed, in no acute distress  Musculoskeletal: 1+ edema in left ankle only  Neurological examination  Mentation: Alert oriented to time, place, history taking. Follows all commands speech and language fluent. Very pleasant. MMSE 22/30 Cranial nerve II-XII: Extraocular movements were full, visual field were full on confrontational test.  Motor: The motor testing reveals 5 over 5 strength of all 4 extremities. Good symmetric motor tone is noted throughout.  Sensory: Sensory testing is intact to soft touch on all 4 extremities. No evidence of extinction is noted.  Coordination: Cerebellar testing reveals good finger-nose-finger and heel-to-shin bilaterally.  Gait and station: Gait is normal. Reflexes: Deep tendon reflexes are symmetric and normal bilaterally.    DIAGNOSTIC DATA (LABS, IMAGING, TESTING) - I reviewed patient records, labs, notes, testing and imaging myself where available.  Lab  Results  Component Value Date   WBC 7.5 02/17/2014   HGB 14.5 02/17/2014   HCT 45.7 02/17/2014   MCV 103.1* 02/17/2014   PLT 168 01/03/2014      Component Value Date/Time   NA 139 02/17/2014 1745   K 4.7 02/17/2014 1745   CL 100 02/17/2014 1745   CO2 30 02/17/2014 1745   GLUCOSE 101* 02/17/2014 1745   BUN 13 02/17/2014 1745   CREATININE 1.20 02/17/2014 1745   CREATININE 1.14 01/05/2014 0448   CALCIUM 9.5 02/17/2014 1745   PROT 7.3 02/17/2014 1745   ALBUMIN 4.6 02/17/2014 1745   AST 31 02/17/2014 1745  ALT 20 02/17/2014 1745   ALKPHOS 76 02/17/2014 1745   BILITOT 0.5 02/17/2014 1745   GFRNONAA 55* 01/05/2014 0448   GFRAA 63* 01/05/2014 0448   Lab Results  Component Value Date   [id number] 177 07/04/2013   Lab Results  Component Value Date   TSH 0.747 01/02/2014      ASSESSMENT AND PLAN 80 y.o. year old male  has a past medical history of Memory loss; OCD (obsessive compulsive disorder); Dysphagia; Gastroesophageal reflux disease; Degenerative arthritis; Dyslipidemia; Hearing loss; Diverticulitis; Left bundle branch block; Nocturnal leg cramps; Allergy; Seizures; Substance abuse; Ulcer; Cataract; Anxiety; Hypertension; and Hearing difficulty. here with:  1. Alzheimer's disease  Patient was recently hospitalized and was in rehab. While in the hospital he experience delirium with agitation.  Increased confusion remained an issue while in the rehab facility. Now that the patient is at home, he is no longer agitated but daughter notices that his memory has "changed." Today the patient scored 22/30 on MMSE and his previous score was 18/30. I explained to the daughter that alzheimer's disease is progressive and hospitalization can increase confusion and the patient may not return to his previous baseline. However, I feel that the patient is doing well. He continues to live alone and is able to do simple household chores. This has not changed since his hospitalization. The patient should continue the  Aricept and Namenda. If the patient becomes agitated or has behavioral issues the daughter should contact us. Patient should keep scheduled appointment in September to follow-up.    Ward Givens, MSN, NP-C 02/27/2014, 2:50 PM [facility] [address], [place], [place] [address] [phone]  Note: This document was prepared with digital dictation and possible smart phrase technology. Any transcriptional errors that result from this process are unintentional.

## 2014-02-28 ENCOUNTER — Other Ambulatory Visit: Payer: Self-pay | Admitting: Internal Medicine

## 2014-02-28 ENCOUNTER — Encounter: Payer: Self-pay | Admitting: Cardiovascular Disease

## 2014-03-01 ENCOUNTER — Other Ambulatory Visit: Payer: Self-pay | Admitting: Internal Medicine

## 2014-03-02 ENCOUNTER — Ambulatory Visit (INDEPENDENT_AMBULATORY_CARE_PROVIDER_SITE_OTHER): Payer: Medicare Other | Admitting: Emergency Medicine

## 2014-03-02 VITALS — BP 138/56 | HR 60 | Temp 98.6°F | Resp 16 | Ht 67.0 in | Wt 157.0 lb

## 2014-03-02 DIAGNOSIS — R109 Unspecified abdominal pain: Secondary | ICD-10-CM

## 2014-03-02 DIAGNOSIS — K440 Diaphragmatic hernia with obstruction, without gangrene: Secondary | ICD-10-CM

## 2014-03-02 LAB — POCT CBC
GRANULOCYTE PERCENT: 60.2 % (ref 37–80)
HCT, POC: 42.6 % — AB (ref 43.5–53.7)
Hemoglobin: 13.5 g/dL — AB (ref 14.1–18.1)
Lymph, poc: 2.4 (ref 0.6–3.4)
MCH, POC: 32.5 pg — AB (ref 27–31.2)
MCHC: 31.7 g/dL — AB (ref 31.8–35.4)
MCV: 102.5 fL — AB (ref 80–97)
MID (CBC): 0.6 (ref 0–0.9)
MPV: 8.2 fL (ref 0–99.8)
PLATELET COUNT, POC: 223 10*3/uL (ref 142–424)
POC GRANULOCYTE: 4.6 (ref 2–6.9)
POC LYMPH PERCENT: 31.7 %L (ref 10–50)
POC MID %: 8.1 %M (ref 0–12)
RBC: 4.16 M/uL — AB (ref 4.69–6.13)
RDW, POC: 13.8 %
WBC: 7.7 10*3/uL (ref 4.6–10.2)

## 2014-03-02 NOTE — Patient Instructions (Signed)
Hernia A hernia occurs when an internal organ pushes out through a weak spot in the abdominal wall. Hernias most commonly occur in the groin and around the navel. Hernias often can be pushed back into place (reduced). Most hernias tend to get worse over time. Some abdominal hernias can get stuck in the opening (irreducible or incarcerated hernia) and cannot be reduced. An irreducible abdominal hernia which is tightly squeezed into the opening is at risk for impaired blood supply (strangulated hernia). A strangulated hernia is a medical emergency. Because of the risk for an irreducible or strangulated hernia, surgery may be recommended to repair a hernia. CAUSES   Heavy lifting.  Prolonged coughing.  Straining to have a bowel movement.  A cut (incision) made during an abdominal surgery. HOME CARE INSTRUCTIONS   Bed rest is not required. You may continue your normal activities.  Avoid lifting more than 10 pounds (4.5 kg) or straining.  Cough gently. If you are a smoker it is best to stop. Even the best hernia repair can break down with the continual strain of coughing. Even if you do not have your hernia repaired, a cough will continue to aggravate the problem.  Do not wear anything tight over your hernia. Do not try to keep it in with an outside bandage or truss. These can damage abdominal contents if they are trapped within the hernia sac.  Eat a normal diet.  Avoid constipation. Straining over long periods of time will increase hernia size and encourage breakdown of repairs. If you cannot do this with diet alone, stool softeners may be used. SEEK IMMEDIATE MEDICAL CARE IF:   You have a fever.  You develop increasing abdominal pain.  You feel nauseous or vomit.  Your hernia is stuck outside the abdomen, looks discolored, feels hard, or is tender.  You have any changes in your bowel habits or in the hernia that are unusual for you.  You have increased pain or swelling around the  hernia.  You cannot push the hernia back in place by applying gentle pressure while lying down. MAKE SURE YOU:   Understand these instructions.  Will watch your condition.  Will get help right away if you are not doing well or get worse. Document Released: 09/27/2005 Document Revised: 12/20/2011 Document Reviewed: 05/16/2008 Exit[employer] Patient Information 2014 [employer].  

## 2014-03-02 NOTE — Progress Notes (Signed)
[facility] [address], [place] [address] [phone]  Date:  03/02/2014   Name:  Daniel [name]   DOB:  [birth year]   MRN:  [id number]  PCP:  Leandrew Koyanagi, MD    Chief Complaint: Abdominal Pain   History of Present Illness:  Daniel [name] is a 80 y.o. very pleasant male patient who presents with the following:  Patient has dementia and a history or diverticulitis and left inguinal hernia that family was advised to simply reduce as needed and not repair due health issues.  He has experienced pain in left abdomen all day.  Had a BM this morning.  No nausea or vomiting.  No stool change.  No blood in stool.  No fever or chills.  Alternately complaining of pain in abdomen and scrotum.  No improvement with over the counter medications or other home remedies. Denies other complaint or health concern today.   Patient Active Problem List   Diagnosis Date Noted  . Syncope and collapse 01/04/2014  . Cardiac pacemaker implanted- St Jude - 01/03/14 01/04/2014  . Acute diastolic heart failure 75/07/2584  . Asthma 01/02/2014  . Heart block 01/02/2014  . Heart block AV second degree 01/02/2014  . BPH (benign prostatic hyperplasia) 01/24/2013  . GERD (gastroesophageal reflux disease) 01/24/2013  . HTN (hypertension) 01/24/2013  . Other and unspecified hyperlipidemia 01/24/2013  . Diverticular disease of colon 01/24/2013  . Alzheimer's disease 01/03/2013    Past Medical History  Diagnosis Date  . Memory loss   . OCD (obsessive compulsive disorder)   . Dysphagia     Mild  . Gastroesophageal reflux disease   . Degenerative arthritis   . Dyslipidemia   . Hearing loss   . Diverticulitis   . Left bundle branch block   . Nocturnal leg cramps   . Allergy   . Seizures   . Substance abuse   . Ulcer   . Cataract   . Anxiety   . Hypertension   . Hearing difficulty     bilateral hearing aids    Past Surgical History  Procedure Laterality Date  .  Tonsillectomy    . Turp vaporization    . Cataract extraction Bilateral   . Basal cell carcinoma excision    . Prostate surgery    . Eye surgery      History  Substance Use Topics  . Smoking status: Former Research scientist (life sciences)  . Smokeless tobacco: Never Used  . Alcohol Use: No     Comment: History of alcoholism, no longer drinking    Family History  Problem Relation Age of Onset  . Heart attack Mother   . Dementia Father   . Heart attack Sister   . Heart disease Sister   . Heart attack Brother   . Heart attack Sister   . Heart attack Brother   . Stroke Brother   . Heart disease Daughter   . Heart disease Paternal Grandmother     Allergies  Allergen Reactions  . Sanctura [Trospium]     Seizures, body spasms  . Biaxin [Clarithromycin] Hives    Medication list has been reviewed and updated.  Current Outpatient Prescriptions on File Prior to Visit  Medication Sig Dispense Refill  . aspirin 81 MG tablet Take 81 mg by mouth daily.      . cholecalciferol (VITAMIN D) 1000 UNITS tablet Take 1,000 Units by mouth daily.      Marland Kitchen donepezil (ARICEPT) 10 MG tablet  Take 1 tablet (10 mg total) by mouth at bedtime.  90 tablet  1  . fluticasone (FLONASE) 50 MCG/ACT nasal spray Place 2 sprays into the nose daily.      . memantine (NAMENDA) 10 MG tablet Take 1 tablet (10 mg total) by mouth 2 (two) times daily.  180 tablet  0  . mirabegron ER (MYRBETRIQ) 50 MG TB24 tablet Take 50 mg by mouth daily.      . Multiple Vitamins-Minerals (PRESERVISION AREDS) TABS Take 1 tablet by mouth daily.      Marland Kitchen olopatadine (PATANOL) 0.1 % ophthalmic solution 1 drop 2 (two) times daily.      Marland Kitchen omeprazole (PRILOSEC) 20 MG capsule TAKE ONE CAPSULE BY MOUTH EVERY MORNING ON EMPTY STOMACH  30 capsule  2  . sertraline (ZOLOFT) 25 MG tablet 25 mg in the morning and 50 mg at night  270 tablet  1   No current facility-administered medications on file prior to visit.    Review of Systems:  As per HPI, otherwise negative.     Physical Examination: Filed Vitals:   03/02/14 1718  BP: 138/56  Pulse: 60  Temp: 98.6 F (37 C)  Resp: 16   Filed Vitals:   03/02/14 1718  Height: 5\' 7"  (1.702 m)  Weight: 157 lb (71.215 kg)   Body mass index is 24.58 kg/(m^2). Ideal Body Weight: Weight in (lb) to have BMI = 25: 159.3  GEN: WDWN, NAD, Non-toxic, A & O x 3 HEENT: Atraumatic, Normocephalic. Neck supple. No masses, No LAD. Ears and Nose: No external deformity. CV: RRR, No M/G/R. No JVD. No thrill. No extra heart sounds. PULM: CTA B, no wheezes, crackles, rhonchi. No retractions. No resp. distress. No accessory muscle use. ABD: S, NT, ND, +BS. No rebound. No HSM. EXTR: No c/c/e NEURO Normal gait.  PSYCH: Normally interactive. Conversant. Not depressed or anxious appearing.  Calm demeanor.  Left inguinal hernia.  Easily reduced  Assessment and Plan: Left inguinal hernia  Signed,  Ellison Carwin, MD   Results for orders placed in visit on 03/02/14  POCT CBC      Result Value Ref Range   WBC 7.7  4.6 - 10.2 K/uL   Lymph, poc 2.4  0.6 - 3.4   POC LYMPH PERCENT 31.7  10 - 50 %L   MID (cbc) 0.6  0 - 0.9   POC MID % 8.1  0 - 12 %M   POC Granulocyte 4.6  2 - 6.9   Granulocyte percent 60.2  37 - 80 %G   RBC 4.16 (*) 4.69 - 6.13 M/uL   Hemoglobin 13.5 (*) 14.1 - 18.1 g/dL   HCT, POC 42.6 (*) 43.5 - 53.7 %   MCV 102.5 (*) 80 - 97 fL   MCH, POC 32.5 (*) 27 - 31.2 pg   MCHC 31.7 (*) 31.8 - 35.4 g/dL   RDW, POC 13.8     Platelet Count, POC 223  142 - 424 K/uL   MPV 8.2  0 - 99.8 fL

## 2014-03-11 DIAGNOSIS — L821 Other seborrheic keratosis: Secondary | ICD-10-CM | POA: Diagnosis not present

## 2014-03-11 DIAGNOSIS — L570 Actinic keratosis: Secondary | ICD-10-CM | POA: Diagnosis not present

## 2014-03-11 DIAGNOSIS — Z85828 Personal history of other malignant neoplasm of skin: Secondary | ICD-10-CM | POA: Diagnosis not present

## 2014-03-23 ENCOUNTER — Telehealth: Payer: Self-pay

## 2014-03-23 NOTE — Telephone Encounter (Signed)
[name] - PATIENT'S DAUGHTER DROPPED OFF DISABILITY PARKING PLACARD REQUEST FOR PATIENT.  SAYS THAT HE HAS DEMENTIA  AND RECENTLY FELL.  HE NOW COMPLAINS OF LEG PAIN AND GETS SHORT OF BREATH EASILY.  SHE SAYS IF YOU HAVE ANY QUESTIONS TO PLEASE CALL HER.  BECAUSE OF HIS DEMENTIA, HE CAN NOT DRIVE AND THEY USE HER VEHICLE.  ON THE FORM SHE DIDN'T KNOW IF SHE SHOULD PUT HIS DRIVER'S LICENSE NUMBER BECAUSE IT IS NO GOOD.  SHE WROTE DOWN HIS SS# IF THAT NEEDS TO BEPUT IN THAT SPACE.  PLEASE CALL HER AT [phone].  I HAVE PLACED THE FORMS IN YOUR BOX.

## 2014-04-14 ENCOUNTER — Ambulatory Visit (INDEPENDENT_AMBULATORY_CARE_PROVIDER_SITE_OTHER): Payer: Medicare Other | Admitting: Internal Medicine

## 2014-04-14 VITALS — BP 128/62 | HR 65 | Temp 98.1°F | Resp 20 | Ht 67.5 in | Wt 150.0 lb

## 2014-04-14 DIAGNOSIS — R1032 Left lower quadrant pain: Secondary | ICD-10-CM | POA: Diagnosis not present

## 2014-04-14 DIAGNOSIS — M6281 Muscle weakness (generalized): Secondary | ICD-10-CM | POA: Diagnosis not present

## 2014-04-14 NOTE — Progress Notes (Signed)
Subjective:    Patient ID: Daniel [name], male    DOB: [birth year], 80 y.o.   MRN: [id number]  HPI This chart was scribed for [name], MD by Irene Pap, ED Scribe. This patient was seen in room 11 and the patient's care was started at 1:48 PM.  HPI Comments: Daniel [name] is a 80 y.o. male who [facility] complaining of hernia His daughter believes that his hernia has popped out again He has been seen 3 times prior to have it popped back in He states that he believes that he has pushed it back in because the pain has subsided.  His daughter states that the pain is intermittent and is inguinal based on their past doctor visits She states that her father does not complain about the pain until it is unbearable.  Denies constipation He reports that he starts pushing it back in whenever he feels pain She reports that he has trouble standing and walking, which strains his abdominal muscles, to which she stated the orthopaedic doctor attributes this to lumbar back pain Was previously in physical therapy He reports that he has had previous injuries with his knees Daughter states that he has previously fallen because of low HR and received a pacemaker  See recent OVs--Dr A=Reduced ing her CTabd=no hern seen Dr Le--L [facility] Kidney reduc Dr Everlene Farrier also 2014 Patient Active Problem List   Diagnosis Date Noted  . Syncope and collapse 01/04/2014  . Cardiac pacemaker implanted- St Jude - 01/03/14 01/04/2014  . Acute diastolic heart failure 65/46/5035  . Asthma 01/02/2014  . Heart block 01/02/2014  . Heart block AV second degree 01/02/2014  . BPH (benign prostatic hyperplasia) 01/24/2013  . GERD (gastroesophageal reflux disease) 01/24/2013  . HTN (hypertension) 01/24/2013  . Other and unspecified hyperlipidemia 01/24/2013  . Diverticular disease of colon 01/24/2013  . Alzheimer's disease 01/03/2013   Past Medical History  Diagnosis Date  . Memory  loss   . OCD (obsessive compulsive disorder)   . Dysphagia     Mild  . Gastroesophageal reflux disease   . Degenerative arthritis   . Dyslipidemia   . Hearing loss   . Diverticulitis   . Left bundle branch block   . Nocturnal leg cramps   . Allergy   . Seizures   . Substance abuse   . Ulcer   . Cataract   . Anxiety   . Hypertension   . Hearing difficulty     bilateral hearing aids   Past Surgical History  Procedure Laterality Date  . Tonsillectomy    . Turp vaporization    . Cataract extraction Bilateral   . Basal cell carcinoma excision    . Prostate surgery    . Eye surgery     Allergies  Allergen Reactions  . Sanctura [Trospium]     Seizures, body spasms  . Biaxin [Clarithromycin] Hives   Prior to Admission medications   Medication Sig Start Date End Date Taking? Authorizing Provider  aspirin 81 MG tablet Take 81 mg by mouth daily.   Yes Historical Provider, MD  cholecalciferol (VITAMIN D) 1000 UNITS tablet Take 1,000 Units by mouth daily.   Yes Historical Provider, MD  donepezil (ARICEPT) 10 MG tablet Take 1 tablet (10 mg total) by mouth at bedtime. 02/19/14  Yes Kathrynn Ducking, MD  fluticasone [facility]) 50 MCG/ACT nasal spray Place 2 sprays into the nose daily.   Yes Historical Provider, MD  memantine (NAMENDA) 10 MG tablet Take 1 tablet (10 mg total) by mouth 2 (two) times daily. 02/04/14  Yes Kathrynn Ducking, MD  mirabegron ER (MYRBETRIQ) 50 MG TB24 tablet Take 50 mg by mouth daily.   Yes Historical Provider, MD  Multiple Vitamins-Minerals (PRESERVISION AREDS) TABS Take 1 tablet by mouth daily.   Yes Historical Provider, MD  olopatadine (PATANOL) 0.1 % ophthalmic solution 1 drop 2 (two) times daily.   Yes Historical Provider, MD  omeprazole (PRILOSEC) 20 MG capsule TAKE ONE CAPSULE BY MOUTH EVERY MORNING ON EMPTY STOMACH   Yes Leandrew Koyanagi, MD  sertraline (ZOLOFT) 25 MG tablet 25 mg in the morning and 50 mg at night 02/14/14  Yes Kathrynn Ducking, MD    History   Social History  . Marital Status: Widowed    Spouse Name: N/A    Number of Children: N/A  . Years of Education: N/A   Occupational History  . Retired    Social History Main Topics  . Smoking status: Former Research scientist (life sciences)  . Smokeless tobacco: Never Used  . Alcohol Use: No     Comment: History of alcoholism, no longer drinking  . Drug Use: No  . Sexual Activity: No   Other Topics Concern  . Not on file   Social History Narrative   Widow. Education: [employer]. Exercise walking 2 times a day for 20 minutes.   Review of Systems  Gastrointestinal: Positive for abdominal pain.     Objective:   Physical Exam  Constitutional: He appears well-developed. No distress.  Cardiovascular: Normal rate.   Pulmonary/Chest: Effort normal.  Abdominal: Soft. Bowel sounds are normal. He exhibits no distension and no mass. There is no tenderness. There is no rebound and no guarding.  Non-tender with no obvious tender. ?1 cm defect admitting a fingertip LLQ, slightly tender but without bulge. No inguinal hernia is appreciated.   Musculoskeletal: He exhibits no edema.      Assessment & Plan:  LLQ abdominal pain now resolved and ?related to hernia-direct LLQ He strains most often getting up from sitting due to LE weakness--would benefit from ortho eval with possible PT for strengthening

## 2014-04-18 DIAGNOSIS — Z85828 Personal history of other malignant neoplasm of skin: Secondary | ICD-10-CM | POA: Diagnosis not present

## 2014-04-18 DIAGNOSIS — M7130 Other bursal cyst, unspecified site: Secondary | ICD-10-CM | POA: Diagnosis not present

## 2014-05-03 DIAGNOSIS — R29898 Other symptoms and signs involving the musculoskeletal system: Secondary | ICD-10-CM | POA: Diagnosis not present

## 2014-05-03 DIAGNOSIS — M1610 Unilateral primary osteoarthritis, unspecified hip: Secondary | ICD-10-CM | POA: Diagnosis not present

## 2014-05-09 ENCOUNTER — Other Ambulatory Visit: Payer: Self-pay | Admitting: Neurology

## 2014-05-10 ENCOUNTER — Telehealth: Payer: Self-pay | Admitting: Neurology

## 2014-05-10 NOTE — Telephone Encounter (Signed)
I called the pharmacy.  Namenda IR just became available in generic.  They received the new drug yesterday.  I spoke to Daniel [name] (daughter).  She said they did pick up the Brand Name [place] today, enough to last for 3 months.  She is nervous about changing him to generic for future fills without consulting MD.  Please advise.  Thank you.

## 2014-05-10 NOTE — Telephone Encounter (Signed)
Daughter went to pick up Rx refill for NAMENDA 10 MG tablet,  Pharmacist informed her that Generic brand was now available.  Daughter questioning if is ok to switch to generic.  Please call anytime and advise, if not available please leave message.  Thanks

## 2014-05-10 NOTE — Telephone Encounter (Signed)
I called the daughter. Usually, when the generic first becomes available, there is not much else differential between that and the brand name. If this is in fact the case, I would stay with the brand name for now. Usually, after the generic has been out for one year, the cost of the generic will drop significantly, and I would consider switching to the generic at that time. I have no problem with using the generic drug.

## 2014-05-14 ENCOUNTER — Telehealth: Payer: Self-pay | Admitting: *Deleted

## 2014-05-14 DIAGNOSIS — M6281 Muscle weakness (generalized): Secondary | ICD-10-CM | POA: Diagnosis not present

## 2014-05-14 NOTE — Telephone Encounter (Signed)
Calling patient on wait list, no answer left message to return the call for an appointment.

## 2014-05-14 NOTE — Telephone Encounter (Signed)
Patient's daughter Mateo Flow returning the call, stating that the appointment they have for 06/13/14 at 9 am, was good and that they did not need an earlier appointment. Patient has been removed from wait list.

## 2014-05-23 ENCOUNTER — Ambulatory Visit (INDEPENDENT_AMBULATORY_CARE_PROVIDER_SITE_OTHER): Payer: Medicare Other | Admitting: *Deleted

## 2014-05-23 DIAGNOSIS — I441 Atrioventricular block, second degree: Secondary | ICD-10-CM

## 2014-05-23 LAB — MDC_IDC_ENUM_SESS_TYPE_REMOTE
Battery Remaining Longevity: 109 mo
Battery Remaining Percentage: 95.5 %
Brady Statistic AP VP Percent: 52 %
Brady Statistic AP VS Percent: 1 %
Brady Statistic AS VS Percent: 1 %
Brady Statistic RA Percent Paced: 51 %
Brady Statistic RV Percent Paced: 99 %
Implantable Pulse Generator Model: 2240
Implantable Pulse Generator Serial Number: 7602208
Lead Channel Impedance Value: 580 Ohm
Lead Channel Pacing Threshold Amplitude: 0.75 V
Lead Channel Pacing Threshold Pulse Width: 0.5 ms
Lead Channel Pacing Threshold Pulse Width: 0.5 ms
Lead Channel Sensing Intrinsic Amplitude: 12 mV
Lead Channel Sensing Intrinsic Amplitude: 2.7 mV
Lead Channel Setting Pacing Amplitude: 2 V
MDC IDC MSMT BATTERY VOLTAGE: 3.02 V
MDC IDC MSMT LEADCHNL RA PACING THRESHOLD AMPLITUDE: 0.5 V
MDC IDC MSMT LEADCHNL RV IMPEDANCE VALUE: 550 Ohm
MDC IDC SESS DTM: 20150813075919
MDC IDC SET LEADCHNL RV PACING AMPLITUDE: 2.5 V
MDC IDC SET LEADCHNL RV PACING PULSEWIDTH: 0.5 ms
MDC IDC SET LEADCHNL RV SENSING SENSITIVITY: 4 mV
MDC IDC STAT BRADY AS VP PERCENT: 48 %

## 2014-05-23 NOTE — Progress Notes (Signed)
Remote pacemaker transmission.   

## 2014-05-24 DIAGNOSIS — R29898 Other symptoms and signs involving the musculoskeletal system: Secondary | ICD-10-CM | POA: Diagnosis not present

## 2014-05-30 DIAGNOSIS — R29898 Other symptoms and signs involving the musculoskeletal system: Secondary | ICD-10-CM | POA: Diagnosis not present

## 2014-06-03 ENCOUNTER — Other Ambulatory Visit: Payer: Self-pay | Admitting: Internal Medicine

## 2014-06-04 ENCOUNTER — Encounter: Payer: Self-pay | Admitting: Cardiology

## 2014-06-06 ENCOUNTER — Encounter: Payer: Self-pay | Admitting: Cardiology

## 2014-06-10 ENCOUNTER — Encounter: Payer: Self-pay | Admitting: Cardiovascular Disease

## 2014-06-12 ENCOUNTER — Ambulatory Visit (INDEPENDENT_AMBULATORY_CARE_PROVIDER_SITE_OTHER): Payer: Medicare Other | Admitting: Internal Medicine

## 2014-06-12 ENCOUNTER — Encounter: Payer: Self-pay | Admitting: Internal Medicine

## 2014-06-12 VITALS — BP 126/63 | HR 60 | Temp 98.2°F | Resp 16 | Ht 66.5 in | Wt 157.0 lb

## 2014-06-12 DIAGNOSIS — D518 Other vitamin B12 deficiency anemias: Secondary | ICD-10-CM

## 2014-06-12 DIAGNOSIS — Z0000 Encounter for general adult medical examination without abnormal findings: Secondary | ICD-10-CM | POA: Diagnosis not present

## 2014-06-12 DIAGNOSIS — D7589 Other specified diseases of blood and blood-forming organs: Secondary | ICD-10-CM | POA: Diagnosis not present

## 2014-06-12 NOTE — Progress Notes (Signed)
Subjective:    Patient ID: Daniel [name], male    DOB: 1921/02/06, 80 y.o.   MRN: [id number]  HPI Here for annual health maintenance exam  Patient Active Problem List   Diagnosis Date Noted  . Syncope and collapse 01/04/2014  . Cardiac pacemaker implanted- St Jude - 01/03/14 01/04/2014  . Acute diastolic heart failure 81/82/9937  . Asthma 01/02/2014  . Heart block 01/02/2014  . Heart block AV second degree 01/02/2014  . BPH (benign prostatic hyperplasia) 01/24/2013  . GERD (gastroesophageal reflux disease) 01/24/2013  . HTN (hypertension) 01/24/2013  . Other and unspecified hyperlipidemia 01/24/2013  . Diverticular disease of colon 01/24/2013  . Alzheimer's disease 01/03/2013   Prior to Admission medications   Medication Sig Start Date End Date Taking? Authorizing Provider  aspirin 81 MG tablet Take 81 mg by mouth daily.   Yes Historical Provider, MD  cholecalciferol (VITAMIN D) 1000 UNITS tablet Take 1,000 Units by mouth daily.   Yes Historical Provider, MD  donepezil (ARICEPT) 10 MG tablet Take 1 tablet (10 mg total) by mouth at bedtime. 02/19/14  Yes Kathrynn Ducking, MD  fluticasone [facility]) 50 MCG/ACT nasal spray Place 2 sprays into the nose daily.   Yes Historical Provider, MD  mirabegron ER (MYRBETRIQ) 50 MG TB24 tablet Take 50 mg by mouth daily.   Yes Historical Provider, MD  Multiple Vitamins-Minerals (PRESERVISION AREDS) TABS Take 1 tablet by mouth daily.   Yes Historical Provider, MD  NAMENDA 10 MG tablet TAKE 1 TABLET (10 MG TOTAL) BY MOUTH 2 (TWO) TIMES DAILY.   Yes Kathrynn Ducking, MD  olopatadine (PATANOL) 0.1 % ophthalmic solution 1 drop 2 (two) times daily.   Yes Historical Provider, MD  omeprazole (PRILOSEC) 20 MG capsule TAKE ONE CAPSULE BY MOUTH EVERY MORNING ON EMPTY STOMACH 06/04/14  Yes Leandrew Koyanagi, MD  sertraline (ZOLOFT) 25 MG tablet 25 mg in the morning and 50 mg at night 02/14/14  Yes Kathrynn Ducking, MD    55 and not doing well--daughter  describes a significant change in his cognitive function over the last 6 months He now is reluctant to be active in any way Recent pt referral failed as he got angry and said he was hurting and he just wants to sit til dies He also has begun to ignore food although there is no weight loss Daughter (caretaker) is becoming exhausted with her inability to change his behavior for the better -feels at a loss with how to intervene -is beginning to see the need to explore  more appropriate living environment with caretakers other  A second daughter cleans his house every 2 weeks   Lab review shows the development of macrocytosis since March with a borderline anemia His daughter has made arrangements for power of attorney and living will  Review of Systems Dr Ronnald Ramp derm/arms See notes regarding pacemaker He voices no new complaints in systems other than the present illness except for some problems with his lower denture plate which hurts when he chews///his oral surgeon is making adjustments    Objective:   Physical Exam BP 126/63  Pulse 60  Temp(Src) 98.2 F (36.8 C)  Resp 16  Ht 5' 6.5" (1.689 m)  Wt 157 lb (71.215 kg)  BMI 24.96 kg/m2  SpO2 97% Wt Readings from Last 3 Encounters:  06/12/14 157 lb (71.215 kg)  04/14/14 150 lb (68.04 kg)  03/02/14 157 lb (71.215 kg)   he appears well-developed and well-nourished without acute distress  He carries on a conversation easily without really saying things from memory Tells the same story frequently Feels like a 80 year old should be able to decide what he wants to do Pupils equal round react to light and accommodation/EOMs conjugate TMs clear/hearing grossly intact Nares clear Edentulous but oropharynx clear even under dental plate where he hurts No thyromegaly or lymphadenopathy Heart regular without murmur Lungs clear Abdomen benign Good peripheral pulses/no sensory or motor losses detected in the extremities Cranial nerves II through  XII intact Romberg negative He remains slow to rise from chair needing arms to help      Assessment & Plan:  Declines the flu vac, pneumonia vaccine, zostavax  Annual exam  Macrocytosis - Plan: Folate  Other vitamin B12 deficiency anemia - Plan: Vitamin B12, Folate  him currently these lab values may provide a place to intervene in his cognitive decline if B12 deficiency is present  Dementia/Alzheimer's-he has an appointment with Dr. Jannifer Franklin- neurologist this week  Skin disorders-Dr. Ronnald Ramp  Cardiac= hypertension and heart block with pacemaker-Dr Croitoru  His care was discussed with his daughter extensively today--she is beginning to realize her inability to make a big difference with his increasing problems, particularly his dementia, and will begin this look for living options and caretaking options

## 2014-06-13 ENCOUNTER — Ambulatory Visit (INDEPENDENT_AMBULATORY_CARE_PROVIDER_SITE_OTHER): Payer: Medicare Other | Admitting: Neurology

## 2014-06-13 ENCOUNTER — Encounter: Payer: Self-pay | Admitting: Neurology

## 2014-06-13 VITALS — BP 119/60 | HR 59 | Wt 159.0 lb

## 2014-06-13 DIAGNOSIS — G309 Alzheimer's disease, unspecified: Principal | ICD-10-CM

## 2014-06-13 DIAGNOSIS — F0280 Dementia in other diseases classified elsewhere without behavioral disturbance: Secondary | ICD-10-CM

## 2014-06-13 LAB — VITAMIN B12: VITAMIN B 12: 660 pg/mL (ref 211–911)

## 2014-06-13 LAB — FOLATE: Folate: >20 ng/mL

## 2014-06-13 MED ORDER — DONEPEZIL HCL 5 MG PO TABS: 5.0000 mg | ORAL_TABLET | Freq: Every day | ORAL | Status: DC

## 2014-06-13 MED ORDER — SERTRALINE HCL 100 MG PO TABS: 100.0000 mg | ORAL_TABLET | Freq: Every day | ORAL | Status: DC

## 2014-06-13 NOTE — Patient Instructions (Signed)
Alzheimer Disease Alzheimer disease is a mental disorder. It causes memory loss and loss of other mental functions, such as learning, thinking, problem solving, communicating, and completing tasks. The mental losses interfere with the ability to perform daily activities at work, at home, or in social situations. Alzheimer disease usually starts in a person's late 60s or early 70s but can start earlier in life (familial form). The mental changes caused by this disease are permanent and worsen over time. As the illness progresses, the ability to do even the simplest things is lost. Survival with Alzheimer disease ranges from several years to as long as 20 years. CAUSES Alzheimer disease is caused by abnormally high levels of a protein (beta-amyloid) in the brain. This protein forms very small deposits within and around the brain's nerve cells. These deposits prevent the nerve cells from working properly. Experts are not certain what causes the beta-amyloid deposits in this disease. RISK FACTORS The following major risk factors have been identified:  Increasing age.  Certain genetic variations, such as Down syndrome (trisomy 21). SYMPTOMS In the early stages of Alzheimer disease, you are still able to perform daily activities but need greater effort, more time, or memory aids. Early symptoms include:  Mild memory loss of recent events, names, or phone numbers.  Loss of objects.  Minor loss of vocabulary.  Difficulty with complex tasks, such as paying bills or driving in unfamiliar locations. Other mental functions deteriorate as the disease worsens. These changes slowly go from mild to severe. Symptoms at this stage include:  Difficulty remembering. You may not be able to recall personal information such as your address and telephone number. You may become confused about the date, the season of the year, or your location.  Difficulty maintaining attention. You may forget what you wanted to say  during conversations and repeat what you have already said.  Difficulty learning new information or tasks. You may not remember what you read or the name of a new friend you met.  Difficulty counting or doing math. You may have difficulty with complex math problems. You may make mistakes in paying bills or managing your checkbook.  Poor reasoning and judgment. You may make poor decisions or not dress right for the weather.  Difficulty communicating. You may have regular difficulty remembering words, naming objects, expressing yourself clearly, or writing sentences that make sense.  Difficulty performing familiar daily activities. You may get lost driving in familiar locations or need help eating, bathing, dressing, grooming, or using the toilet. You may have difficulty maintaining bladder or bowel control.  Difficulty recognizing familiar faces. You may confuse family members or close friends with one another. You may not recognize a close relative or may mistake strangers for family. Alzheimer disease also may cause changes in personality and behavior. These changes include:   Loss of interest or motivation.  Social withdrawal.  Anxiety.  Difficulty sleeping.  Uncharacteristic anger or combativeness.  A false belief that someone is trying to harm you (paranoia).  Seeing things that are not real (hallucinations).  Agitation. Confusion and disruptive behavior are often worse at night and may be triggered by changes in the environment or acute medical issues. DIAGNOSIS  Alzheimer disease is diagnosed through an assessment by your health care provider. During this assessment, your health care provider will do the following:  Ask you and your family, friends, or caregivers questions about your symptoms, their frequency, their duration and progression, and the effect they are having on your life.    Ask questions about your personal and family medical history and use of alcohol or drugs,  including prescription medicine.  Perform a physical exam and order blood tests and brain imaging exams. Your health care provider may refer you to a specialist for detailed evaluation of your mental functions (neuropsychological testing).  Many different brain disorders, medical conditions, and certain substances can cause symptoms that resemble Alzheimer disease symptoms. These must be ruled out before this disease can be diagnosed. If Alzheimer disease is diagnosed, it will be considered either "possible" or "probable" Alzheimer disease. "Possible" Alzheimer disease means that your symptoms are typical of the disease and no other disorder is causing them. "Probable" Alzheimer disease means that you also have a family history of the disease or genetic test results that support the diagnosis. Certain tests, mostly used in research studies, are highly specific for Alzheimer disease.  TREATMENT  There is currently no cure for this disease. The goals of treatment are to:  Slow down the progression of the disease.  Preserve mental function as long as possible.  Manage behavioral symptoms.  Make life easier for the person with Alzheimer disease and his or her caregivers. The following treatment options are available:  Medicine. Certain medicines may help slow memory loss by changing the level of certain chemicals in the brain. Medicine may also help with behavioral symptoms.  Talk therapy. Talk therapy provides education, support, and memory aids for people with this disease. It is most effective in the early stages of the illness.  Caregiving. Caregivers may be family members, friends, or trained medical professionals. They help the person with Alzheimer disease with daily life activities. Caregiving may take place at home or at a nursing facility.  Family support groups. These provide education, emotional support, and information about community resources to family members who are taking care of  the person with this disease. Document Released: 06/08/2004 Document Revised: 02/11/2014 Document Reviewed: 02/02/2013 [employer] Patient Information 2015 [employer]. This information is not intended to replace advice given to you by your health care provider. Make sure you discuss any questions you have with your health care provider.  

## 2014-06-13 NOTE — Progress Notes (Signed)
Reason for visit: Alzheimer's disease  Daniel [name] is an 80 y.o. male  History of present illness:  Daniel [name] is a 80 year old right-handed white male with a history of a memory disturbance. The patient has had ongoing issues with memory since last seen, and the family indicates that his cognitive functioning level is declining. The patient will go into rooms and cannot remember why he went in. The patient is becoming more withdrawn, and he wants to sit around the house all day long, and not engage in any physical activities. He used to walk 2 miles a day, and now he does not want to walk at all. He may read books, but he does not have any other hobbies or activities that he performs. The patient sleeps well at night. The patient appears to be having some problems with diarrhea on the Aricept. The patient indicates that he is waiting to die. He is on Aricept 10 mg at night, Namenda, and Zoloft 75 mg daily.  Past Medical History  Diagnosis Date  . Memory loss   . OCD (obsessive compulsive disorder)   . Dysphagia     Mild  . Gastroesophageal reflux disease   . Degenerative arthritis   . Dyslipidemia   . Hearing loss   . Diverticulitis   . Left bundle branch block   . Nocturnal leg cramps   . Allergy   . Seizures   . Substance abuse   . Ulcer   . Cataract   . Anxiety   . Hypertension   . Hearing difficulty     bilateral hearing aids    Past Surgical History  Procedure Laterality Date  . Tonsillectomy    . Turp vaporization    . Cataract extraction Bilateral   . Basal cell carcinoma excision    . Prostate surgery    . Eye surgery      Family History  Problem Relation Age of Onset  . Heart attack Mother   . Dementia Father   . Heart attack Sister   . Heart disease Sister   . Heart attack Brother   . Heart attack Sister   . Heart attack Brother   . Stroke Brother   . Heart disease Daughter   . Heart disease Paternal Grandmother     Social history:  reports  that he has quit smoking. He has never used smokeless tobacco. He reports that he does not drink alcohol or use illicit drugs.    Allergies  Allergen Reactions  . Sanctura [Trospium]     Seizures, body spasms  . Biaxin [Clarithromycin] Hives    Medications:  Current Outpatient Prescriptions on File Prior to Visit  Medication Sig Dispense Refill  . aspirin 81 MG tablet Take 81 mg by mouth daily.      . cholecalciferol (VITAMIN D) 1000 UNITS tablet Take 1,000 Units by mouth daily.      . fluticasone (FLONASE) 50 MCG/ACT nasal spray Place 2 sprays into the nose daily.      . mirabegron ER (MYRBETRIQ) 50 MG TB24 tablet Take 50 mg by mouth daily.      . Multiple Vitamins-Minerals (PRESERVISION AREDS) TABS Take 1 tablet by mouth daily.      Marland Kitchen NAMENDA 10 MG tablet TAKE 1 TABLET (10 MG TOTAL) BY MOUTH 2 (TWO) TIMES DAILY.  180 tablet  1  . olopatadine (PATANOL) 0.1 % ophthalmic solution 1 drop 2 (two) times daily.      Marland Kitchen omeprazole (PRILOSEC)  20 MG capsule TAKE ONE CAPSULE BY MOUTH EVERY MORNING ON EMPTY STOMACH  30 capsule  0   No current facility-administered medications on file prior to visit.    ROS:  Out of a complete 14 system review of symptoms, the patient complains only of the following symptoms, and all other reviewed systems are negative.  Decreased activity Hearing loss, runny nose Eye itching, light sensitivity Choking Cold intolerance Diarrhea, fecal incontinence Daytime sleepiness Incontinence of the bladder, urgency of the bladder Joint pain, achy muscles Itching Bruising easily, anemia Memory loss, speech difficulties Confusion, decreased concentration  Blood pressure 119/60, pulse 59, weight 159 lb (72.122 kg).  Physical Exam  General: The patient is alert and cooperative at the time of the examination.  Skin: No significant peripheral edema is noted.   Neurologic Exam  Mental status: The Mini-Mental status examination done today shows a total score  20/30.  Cranial nerves: Facial symmetry is present. Speech is normal, no aphasia or dysarthria is noted. Extraocular movements are full. Visual fields are full.  Motor: The patient has good strength in all 4 extremities.  Sensory examination: Soft touch sensation is symmetric on the face, arms, and legs.  Coordination: The patient has good finger-nose-finger and heel-to-shin bilaterally. The patient does have some apraxia with the use of the lower extremities.  Gait and station: The patient has a slightly wide-based gait. Tandem gait is unsteady. Romberg is negative. No drift is seen.  Reflexes: Deep tendon reflexes are symmetric.   Assessment/Plan:  1. Memory disturbance, Alzheimer's disease  2. Possible depression  The patient appears becoming more withdrawn, and he may be developing some underlying depression. The patient will be increased on the Zoloft taking 100 mg daily. The patient is having some fecal incontinence and diarrhea on Aricept, and the dose will be reduced to 5 mg at night. The patient will continue the [place]. He will followup in 5 or 6 months.  Jill Alexanders MD 06/13/2014 8:00 PM  [facility] [address] [place], [place] [address]  Phone [phone] Fax [phone]

## 2014-07-10 ENCOUNTER — Other Ambulatory Visit: Payer: Self-pay | Admitting: Internal Medicine

## 2014-07-11 ENCOUNTER — Other Ambulatory Visit: Payer: Self-pay | Admitting: Internal Medicine

## 2014-07-11 NOTE — Telephone Encounter (Signed)
Dr Laney Pastor, pt just had a CPE w/you, but don't see GERD discussed. You wanted to see pt in 1 yr. Can we send in RFs for a year?

## 2014-07-23 ENCOUNTER — Encounter: Payer: Self-pay | Admitting: Family Medicine

## 2014-07-23 ENCOUNTER — Ambulatory Visit (INDEPENDENT_AMBULATORY_CARE_PROVIDER_SITE_OTHER): Payer: Medicare Other | Admitting: Family Medicine

## 2014-07-23 VITALS — BP 120/60 | HR 62 | Temp 98.0°F | Resp 16 | Ht 67.0 in | Wt 158.0 lb

## 2014-07-23 DIAGNOSIS — Z23 Encounter for immunization: Secondary | ICD-10-CM | POA: Diagnosis not present

## 2014-08-26 ENCOUNTER — Ambulatory Visit (INDEPENDENT_AMBULATORY_CARE_PROVIDER_SITE_OTHER): Payer: Medicare Other | Admitting: *Deleted

## 2014-08-26 ENCOUNTER — Encounter: Payer: Self-pay | Admitting: Cardiovascular Disease

## 2014-08-26 DIAGNOSIS — I441 Atrioventricular block, second degree: Secondary | ICD-10-CM

## 2014-08-26 LAB — MDC_IDC_ENUM_SESS_TYPE_REMOTE
Battery Remaining Longevity: 107 mo
Battery Remaining Percentage: 95.5 %
Brady Statistic AP VP Percent: 46 %
Brady Statistic AS VS Percent: 1 %
Brady Statistic RV Percent Paced: 99 %
Implantable Pulse Generator Model: 2240
Implantable Pulse Generator Serial Number: 7602208
Lead Channel Impedance Value: 540 Ohm
Lead Channel Pacing Threshold Amplitude: 0.75 V
Lead Channel Pacing Threshold Pulse Width: 0.5 ms
Lead Channel Pacing Threshold Pulse Width: 0.5 ms
Lead Channel Sensing Intrinsic Amplitude: 12 mV
Lead Channel Setting Pacing Amplitude: 2 V
MDC IDC MSMT BATTERY VOLTAGE: 3.01 V
MDC IDC MSMT LEADCHNL RA PACING THRESHOLD AMPLITUDE: 0.5 V
MDC IDC MSMT LEADCHNL RA SENSING INTR AMPL: 4.9 mV
MDC IDC MSMT LEADCHNL RV IMPEDANCE VALUE: 540 Ohm
MDC IDC SESS DTM: 20151116070017
MDC IDC SET LEADCHNL RV PACING AMPLITUDE: 2.5 V
MDC IDC SET LEADCHNL RV PACING PULSEWIDTH: 0.5 ms
MDC IDC SET LEADCHNL RV SENSING SENSITIVITY: 4 mV
MDC IDC STAT BRADY AP VS PERCENT: 1 %
MDC IDC STAT BRADY AS VP PERCENT: 54 %
MDC IDC STAT BRADY RA PERCENT PACED: 46 %

## 2014-08-26 NOTE — Progress Notes (Signed)
Remote pacemaker transmission.   

## 2014-08-30 ENCOUNTER — Encounter: Payer: Self-pay | Admitting: Cardiology

## 2014-09-01 ENCOUNTER — Encounter: Payer: Self-pay | Admitting: Internal Medicine

## 2014-09-01 ENCOUNTER — Ambulatory Visit (INDEPENDENT_AMBULATORY_CARE_PROVIDER_SITE_OTHER): Payer: Medicare Other

## 2014-09-01 ENCOUNTER — Ambulatory Visit (INDEPENDENT_AMBULATORY_CARE_PROVIDER_SITE_OTHER): Payer: Medicare Other | Admitting: Internal Medicine

## 2014-09-01 VITALS — BP 156/58 | HR 60 | Temp 97.5°F | Resp 16 | Ht 67.5 in | Wt 158.0 lb

## 2014-09-01 DIAGNOSIS — R100 Acute abdomen: Secondary | ICD-10-CM

## 2014-09-01 DIAGNOSIS — F0390 Unspecified dementia without behavioral disturbance: Secondary | ICD-10-CM | POA: Diagnosis not present

## 2014-09-01 DIAGNOSIS — R109 Unspecified abdominal pain: Secondary | ICD-10-CM

## 2014-09-01 LAB — POCT CBC
Granulocyte percent: 53 %G (ref 37–80)
HCT, POC: 43.1 % — AB (ref 43.5–53.7)
HEMOGLOBIN: 13.7 g/dL — AB (ref 14.1–18.1)
Lymph, poc: 3.5 — AB (ref 0.6–3.4)
MCH, POC: 31.5 pg — AB (ref 27–31.2)
MCHC: 31.7 g/dL — AB (ref 31.8–35.4)
MCV: 99.3 fL — AB (ref 80–97)
MID (CBC): 0.6 (ref 0–0.9)
MPV: 6.6 fL (ref 0–99.8)
POC Granulocyte: 4.7 (ref 2–6.9)
POC LYMPH PERCENT: 39.8 %L (ref 10–50)
POC MID %: 7.2 %M (ref 0–12)
Platelet Count, POC: 247 10*3/uL (ref 142–424)
RBC: 4.34 M/uL — AB (ref 4.69–6.13)
RDW, POC: 14.6 %
WBC: 8.8 10*3/uL (ref 4.6–10.2)

## 2014-09-01 MED ORDER — OLOPATADINE HCL 0.1 % OP SOLN: 1.0000 [drp] | Freq: Two times a day (BID) | OPHTHALMIC | Status: DC

## 2014-09-01 NOTE — Addendum Note (Signed)
Addended by: Leandrew Koyanagi on: 09/01/2014 05:05 PM   Modules accepted: Orders

## 2014-09-01 NOTE — Progress Notes (Signed)
Subjective:    Patient ID: Daniel [name], male    DOB: October 17, 1920, 80 y.o.   MRN: [id number]  HPI  Chief Complaint  Patient presents with  . Abdominal Pain    since this morning   This chart was scribed for [name], MD by Thea Alken, ED Scribe. This patient was seen in room 9 and the patient's care was started at 4:00 PM.  HPI Comments: Daniel [name] is a 80 y.o. male with hx of dementia who presents to the [facility] complaining of lower, bilateral abdominal pain that began this morning. Per daughter, pt had pain this morning upon waking. She states he ate breakfast consisting of cereal and she assumed he had gotten better. She reports her brother came to spend time with him and reports persistent abdominal pain. Pt denies diarrhea, difficulty urinating, dysuria, emesis, and fevers.   Past Medical History  Diagnosis Date  . Memory loss   . OCD (obsessive compulsive disorder)   . Dysphagia     Mild  . Gastroesophageal reflux disease   . Degenerative arthritis   . Dyslipidemia   . Hearing loss   . Diverticulitis   . Left bundle branch block   . Nocturnal leg cramps   . Allergy   . Seizures   . Substance abuse   . Ulcer   . Cataract   . Anxiety   . Hypertension   . Hearing difficulty     bilateral hearing aids   Allergies  Allergen Reactions  . Sanctura [Trospium]     Seizures, body spasms  . Biaxin [Clarithromycin] Hives   Prior to Admission medications   Medication Sig Start Date End Date Taking? Authorizing Provider  aspirin 81 MG tablet Take 81 mg by mouth daily.    Historical Provider, MD  cholecalciferol (VITAMIN D) 1000 UNITS tablet Take 1,000 Units by mouth daily.    Historical Provider, MD  donepezil (ARICEPT) 5 MG tablet Take 1 tablet (5 mg total) by mouth at bedtime. 06/13/14   Kathrynn Ducking, MD  fluticasone (FLONASE) 50 MCG/ACT nasal spray Place 2 sprays into the nose daily.    Historical Provider, MD  mirabegron ER  (MYRBETRIQ) 50 MG TB24 tablet Take 50 mg by mouth daily.    Historical Provider, MD  Multiple Vitamins-Minerals (PRESERVISION AREDS) TABS Take 1 tablet by mouth daily.    Historical Provider, MD  NAMENDA 10 MG tablet TAKE 1 TABLET (10 MG TOTAL) BY MOUTH 2 (TWO) TIMES DAILY.    Kathrynn Ducking, MD  olopatadine (PATANOL) 0.1 % ophthalmic solution 1 drop 2 (two) times daily.    Historical Provider, MD  omeprazole (PRILOSEC) 20 MG capsule TAKE ONE CAPSULE BY MOUTH EVERY MORNING ON EMPTY STOMACH 07/14/14   Leandrew Koyanagi, MD  sertraline (ZOLOFT) 100 MG tablet Take 1 tablet (100 mg total) by mouth daily. 06/13/14   Kathrynn Ducking, MD   Review of Systems  Constitutional: Negative for fever and chills.  Gastrointestinal: Negative for vomiting and diarrhea.  Genitourinary: Negative for dysuria and difficulty urinating.   Objective:   Physical Exam  Constitutional: He is oriented to person, place, and time. He appears well-developed and well-nourished. No distress.  HENT:  Head: Normocephalic and atraumatic.  Eyes: Conjunctivae and EOM are normal.  Neck: Neck supple.  Cardiovascular: Normal rate.   Pulmonary/Chest: Effort normal.  Abdominal:  Non distended but with marked guarding and with moderate to severe pain with palpation in  both lower quadrants. percussion and rebound tenderness.  Musculoskeletal: Normal range of motion.  Neurological: He is alert and oriented to person, place, and time.  Skin: Skin is warm and dry.  Psychiatric: He has a normal mood and affect. His behavior is normal.  Nursing note and vitals reviewed. [facility] reading (PRIMARY) by  Dr. Laney Pastor good psoas shadows/no air-fluid levels/calcium left lower quadrant could be stone or phlebolith  Results for orders placed or performed in visit on 09/01/14  POCT CBC  Result Value Ref Range   WBC 8.8 4.6 - 10.2 K/uL   Lymph, poc 3.5 (A) 0.6 - 3.4   POC LYMPH PERCENT 39.8 10 - 50 %L   MID (cbc) 0.6 0 - 0.9   POC MID % 7.2  0 - 12 %M   POC Granulocyte 4.7 2 - 6.9   Granulocyte percent 53.0 37 - 80 %G   RBC 4.34 (A) 4.69 - 6.13 M/uL   Hemoglobin 13.7 (A) 14.1 - 18.1 g/dL   HCT, POC 43.1 (A) 43.5 - 53.7 %   MCV 99.3 (A) 80 - 97 fL   MCH, POC 31.5 (A) 27 - 31.2 pg   MCHC 31.7 (A) 31.8 - 35.4 g/dL   RDW, POC 14.6 %   Platelet Count, POC 247 142 - 424 K/uL   MPV 6.6 0 - 99.8 fL   Reexam after laboratory and x-rays= he reports less abdominal pain Unable to urinate because he just did so before arriving here   Assessment & Plan:  Abdominal pain, acute - Plan: DG Abd 2 Views, POCT CBC, POCT UA - Microscopic Only, POCT urinalysis dipstick  Dementia, without behavioral disturbance   I have completed the patient encounter in its entirety as documented by the scribe, with editing by me where necessary. Robert P. Laney Pastor, M.D. I personally performed the services described in this documentation, which was scribed in my presence. The recorded information has been reviewed and is accurate.   Daughter will bring urine sample back for testing For now observation is the best course-they will go to the emergency room for any fever or vomiting

## 2014-09-02 ENCOUNTER — Telehealth: Payer: Self-pay | Admitting: Neurology

## 2014-09-02 MED ORDER — DONEPEZIL HCL 5 MG PO TABS: 5.0000 mg | ORAL_TABLET | Freq: Two times a day (BID) | ORAL | Status: DC

## 2014-09-02 NOTE — Telephone Encounter (Signed)
I called the patient, talked with the daughter. The patient has had a progression with the cognitive status since coming down on the Aricept at 5 mg. He did not recognize his son over the weekend. He is now having problems with constipation, not diarrhea. We will go back up on the Aricept, split dosing it taking 5 mg in the morning and 5 mg in the evening. They will call me if there are issues with this.

## 2014-09-02 NOTE — Telephone Encounter (Signed)
Patient's daughter Mateo Flow calling to state that since patient's dosage of Donepezil has been lowered to 5 mg, she has noticed a huge change in patient's memory, wants him to go back to the 10 mg, please return call and advise.

## 2014-09-03 ENCOUNTER — Ambulatory Visit (INDEPENDENT_AMBULATORY_CARE_PROVIDER_SITE_OTHER): Payer: Medicare Other | Admitting: Family Medicine

## 2014-09-03 VITALS — BP 130/50 | HR 66 | Temp 98.1°F | Resp 18 | Ht 67.5 in | Wt 158.0 lb

## 2014-09-03 DIAGNOSIS — B029 Zoster without complications: Secondary | ICD-10-CM

## 2014-09-03 DIAGNOSIS — G309 Alzheimer's disease, unspecified: Secondary | ICD-10-CM

## 2014-09-03 DIAGNOSIS — F0280 Dementia in other diseases classified elsewhere without behavioral disturbance: Secondary | ICD-10-CM

## 2014-09-03 DIAGNOSIS — R0789 Other chest pain: Secondary | ICD-10-CM | POA: Diagnosis not present

## 2014-09-03 MED ORDER — VALACYCLOVIR HCL 1 G PO TABS: 1000.0000 mg | ORAL_TABLET | Freq: Two times a day (BID) | ORAL | Status: DC

## 2014-09-03 MED ORDER — TRAMADOL HCL 50 MG PO TABS: 50.0000 mg | ORAL_TABLET | Freq: Four times a day (QID) | ORAL | Status: DC | PRN

## 2014-09-03 NOTE — Patient Instructions (Signed)
Take valacyclovir 1 pill 3 times daily for shingles  Take Tylenol 500 mg 2 tablets 3 times daily if needed for pain  Take tramadol 1 every 8 hours if needed for more severe pain. Always be cautious of pain medication use in the elderly and discontinue if it causes any increased confusion.  Try using some Zostrix cream on the rash unless it has active open blisters, then discontinue.  Ask the pharmacist if he has this or the equivalent of it.  Shingles Shingles (herpes zoster) is an infection that is caused by the same virus that causes chickenpox (varicella). The infection causes a painful skin rash and fluid-filled blisters, which eventually break open, crust over, and heal. It may occur in any area of the body, but it usually affects only one side of the body or face. The pain of shingles usually lasts about 1 month. However, some people with shingles may develop long-term (chronic) pain in the affected area of the body. Shingles often occurs many years after the person had chickenpox. It is more common:  In people older than 50 years.  In people with weakened immune systems, such as those with HIV, AIDS, or cancer.  In people taking medicines that weaken the immune system, such as transplant medicines.  In people under great stress. CAUSES  Shingles is caused by the varicella zoster virus (VZV), which also causes chickenpox. After a person is infected with the virus, it can remain in the person's body for years in an inactive state (dormant). To cause shingles, the virus reactivates and breaks out as an infection in a nerve root. The virus can be spread from person to person (contagious) through contact with open blisters of the shingles rash. It will only spread to people who have not had chickenpox. When these people are exposed to the virus, they may develop chickenpox. They will not develop shingles. Once the blisters scab over, the person is no longer contagious and cannot spread the  virus to others. SIGNS AND SYMPTOMS  Shingles shows up in stages. The initial symptoms may be pain, itching, and tingling in an area of the skin. This pain is usually described as burning, stabbing, or throbbing.In a few days or weeks, a painful red rash will appear in the area where the pain, itching, and tingling were felt. The rash is usually on one side of the body in a band or belt-like pattern. Then, the rash usually turns into fluid-filled blisters. They will scab over and dry up in approximately 2-3 weeks. Flu-like symptoms may also occur with the initial symptoms, the rash, or the blisters. These may include:  Fever.  Chills.  Headache.  Upset stomach. DIAGNOSIS  Your health care provider will perform a skin exam to diagnose shingles. Skin scrapings or fluid samples may also be taken from the blisters. This sample will be examined under a microscope or sent to a lab for further testing. TREATMENT  There is no specific cure for shingles. Your health care provider will likely prescribe medicines to help you manage the pain, recover faster, and avoid long-term problems. This may include antiviral drugs, anti-inflammatory drugs, and pain medicines. HOME CARE INSTRUCTIONS   Take a cool bath or apply cool compresses to the area of the rash or blisters as directed. This may help with the pain and itching.   Take medicines only as directed by your health care provider.   Rest as directed by your health care provider.  Keep your rash and blisters  clean with mild soap and cool water or as directed by your health care provider.  Do not pick your blisters or scratch your rash. Apply an anti-itch cream or numbing creams to the affected area as directed by your health care provider.  Keep your shingles rash covered with a loose bandage (dressing).  Avoid skin contact with:  Babies.   Pregnant women.   Children with eczema.   Elderly people with transplants.   People with  chronic illnesses, such as leukemia or AIDS.   Wear loose-fitting clothing to help ease the pain of material rubbing against the rash.  Keep all follow-up visits as directed by your health care provider.If the area involved is on your face, you may receive a referral for a specialist, such as an eye doctor (ophthalmologist) or an ear, nose, and throat (ENT) doctor. Keeping all follow-up visits will help you avoid eye problems, chronic pain, or disability.  SEEK IMMEDIATE MEDICAL CARE IF:   You have facial pain, pain around the eye area, or loss of feeling on one side of your face.  You have ear pain or ringing in your ear.  You have loss of taste.  Your pain is not relieved with prescribed medicines.   Your redness or swelling spreads.   You have more pain and swelling.  Your condition is worsening or has changed.   You have a fever. MAKE SURE YOU:  Understand these instructions.  Will watch your condition.  Will get help right away if you are not doing well or get worse. Document Released: 09/27/2005 Document Revised: 02/11/2014 Document Reviewed: 05/11/2012 [facility] Patient Information 2015 [place], [place]. This information is not intended to replace advice given to you by your health care provider. Make sure you discuss any questions you have with your health care provider.

## 2014-09-03 NOTE — Progress Notes (Signed)
Subjective: 80 year old man who was just here 2 days ago with abdominal pain. That apparently is not a concern today. However he started complaining of discomfort around his pacemaker earlier today to his daughter who is his caregiver. They're little later he complained that his shirt made his skin hurt there. He had chest pain from below the area of the pacemaker. His daughter brought him into be checked. The patient has dementia and is not able to give a great history. He has not had a shingles vaccine.  Objective: Chest wall has a little area of rash just below the pacemaker area in the left upper lateral chest. This area has some superficial red spots in a bout a 3 cm area. He has some mild hypersensitivity. The chest is clear to auscultation. Heart regular without murmurs. No significant pain to deeper palpation of the area.  Assessment: Very early shingles with secondary chest wall pain Alzheimer's  Plan: Explained things to the daughter. Will treat with Valtrex. Decided to give her a few tramadol. Take them with caution only due to his elderly status. See instructions sheets.

## 2014-09-18 DIAGNOSIS — H3530 Unspecified macular degeneration: Secondary | ICD-10-CM | POA: Diagnosis not present

## 2014-09-19 ENCOUNTER — Encounter (HOSPITAL_COMMUNITY): Payer: Self-pay | Admitting: Cardiovascular Disease

## 2014-09-26 ENCOUNTER — Telehealth: Payer: Self-pay

## 2014-09-26 NOTE — Telephone Encounter (Signed)
LMVM for patient to get his flu shot.

## 2014-11-13 ENCOUNTER — Ambulatory Visit (INDEPENDENT_AMBULATORY_CARE_PROVIDER_SITE_OTHER): Payer: Medicare Other | Admitting: Neurology

## 2014-11-13 ENCOUNTER — Encounter: Payer: Self-pay | Admitting: Neurology

## 2014-11-13 VITALS — BP 139/61 | HR 63 | Ht 68.0 in | Wt 160.2 lb

## 2014-11-13 DIAGNOSIS — G309 Alzheimer's disease, unspecified: Secondary | ICD-10-CM

## 2014-11-13 DIAGNOSIS — F0280 Dementia in other diseases classified elsewhere without behavioral disturbance: Secondary | ICD-10-CM

## 2014-11-13 NOTE — Progress Notes (Signed)
Reason for visit: Alzheimer's disease  Daniel [name] is an 81 y.o. male  History of present illness:  Daniel [name] is a 81 year old right-handed white male with a history of Alzheimer's disease. He was reduced on Aricept dose taking 5 mg a day instead of 10 mg a day on his last visit secondary to urinary and fecal incontinence. Unfortunately, within 6 weeks after reducing the dose, his memory seemed to decline. He was put back on 5 mg twice a day, and he has been able tolerate this dose. Unfortunately, his mental status never recovered. He recently had an outbreak of shingles on the left chest over top of his pacemaker. The patient was treated for this outbreak. The patient is having increasing problems with confusion, particularly in the evening hours. He will get his daughter mixed up with someone else, indicating he wants to marry his daughter. He sometimes does not recognize his son. Any change in the environment increases confusion. In general, he sleeps well at night. He will make himself breakfast, heat up something for lunch, and he is able to sleep well at night. He will have occasional episodes of a wet quality to the voice after drinking fluids. This does not occur on a regular basis. He has had a swallowing study done in the past, this was unremarkable.  Past Medical History  Diagnosis Date  . Memory loss   . OCD (obsessive compulsive disorder)   . Dysphagia     Mild  . Gastroesophageal reflux disease   . Degenerative arthritis   . Dyslipidemia   . Hearing loss   . Diverticulitis   . Left bundle branch block   . Nocturnal leg cramps   . Allergy   . Seizures   . Substance abuse   . Ulcer   . Cataract   . Anxiety   . Hypertension   . Hearing difficulty     bilateral hearing aids    Past Surgical History  Procedure Laterality Date  . Tonsillectomy    . Turp vaporization    . Cataract extraction Bilateral   . Basal cell carcinoma excision    . Prostate surgery    .  Eye surgery    . Permanent pacemaker insertion N/A 01/03/2014    Procedure: PERMANENT PACEMAKER INSERTION;  Surgeon: Sanda Klein, MD;  Location: [facility];  Service: Cardiovascular;  Laterality: N/A;    Family History  Problem Relation Age of Onset  . Heart attack Mother   . Dementia Father   . Heart attack Sister   . Heart disease Sister   . Heart attack Brother   . Heart attack Sister   . Heart attack Brother   . Stroke Brother   . Heart disease Daughter   . Heart disease Paternal Grandmother     Social history:  reports that he has quit smoking. He has never used smokeless tobacco. He reports that he does not drink alcohol or use illicit drugs.    Allergies  Allergen Reactions  . Sanctura [Trospium]     Seizures, body spasms  . Biaxin [Clarithromycin] Hives    Medications:  Current Outpatient Prescriptions on File Prior to Visit  Medication Sig Dispense Refill  . aspirin 81 MG tablet Take 81 mg by mouth daily.    . cholecalciferol (VITAMIN D) 1000 UNITS tablet Take 1,000 Units by mouth daily.    Marland Kitchen donepezil (ARICEPT) 5 MG tablet Take 1 tablet (5 mg total) by mouth 2 (two) times  daily. 60 tablet 5  . fluticasone (FLONASE) 50 MCG/ACT nasal spray Place 2 sprays into the nose daily.    . mirabegron ER (MYRBETRIQ) 50 MG TB24 tablet Take 50 mg by mouth daily.    . Multiple Vitamins-Minerals (PRESERVISION AREDS) TABS Take 1 tablet by mouth daily.    Marland Kitchen NAMENDA 10 MG tablet TAKE 1 TABLET (10 MG TOTAL) BY MOUTH 2 (TWO) TIMES DAILY. 180 tablet 1  . omeprazole (PRILOSEC) 20 MG capsule TAKE ONE CAPSULE BY MOUTH EVERY MORNING ON EMPTY STOMACH 30 capsule 11  . sertraline (ZOLOFT) 100 MG tablet Take 1 tablet (100 mg total) by mouth daily. 90 tablet 3   No current facility-administered medications on file prior to visit.    ROS:  Out of a complete 14 system review of symptoms, the patient complains only of the following symptoms, and all other reviewed systems are  negative.  Activity change, decreased activity Hearing loss, hearing aids, runny nose, difficulty swallowing Eye itching, eye redness Incontinence of bowels Daytime sleepiness Incontinence of bladder Urgency Joint pain, joint swelling, aching muscles Itching Memory loss, speech difficulty, weakness Agitation, behavior problems, confusion, decreased concentration, anxiety  Blood pressure 139/61, pulse 63, height 5\' 8"  (1.727 m), weight 160 lb 3.2 oz (72.666 kg).  Physical Exam  General: The patient is alert and cooperative at the time of the examination.  Skin: No significant peripheral edema is noted.   Neurologic Exam  Mental status: The Mini-Mental Status Examination done today shows a total score of 12/30.  Cranial nerves: Facial symmetry is present. Speech is normal, no aphasia or dysarthria is noted. Extraocular movements are full. Visual fields are full.  Motor: The patient has good strength in all 4 extremities.  Sensory examination: Soft touch sensation is symmetric on the face, arms, and legs.  Coordination: The patient has good finger-nose-finger and heel-to-shin bilaterally.  Gait and station: The patient has a normal gait for age. Tandem gait was not tested. Romberg is negative. No drift is seen.  Reflexes: Deep tendon reflexes are symmetric.   Assessment/Plan:  1. Alzheimer's disease  The patient has had a fairly large 8 point drop in the Mini-Mental status examination score since last seen. This is likely related to the reduction of the Aricept dose. The patient is now back on 10 mg daily, but his mental status never recovered. He will continued on Aricept. The patient will follow-up in 6 months. The family indicates that they wished the patient to have a DNR status.  Daniel Alexanders MD 11/14/2014 9:22 PM  [facility] [address] [place],  [address]  Phone [phone] Fax [phone]

## 2014-11-13 NOTE — Patient Instructions (Signed)
Alzheimer Disease Caregiver Guide Alzheimer disease is an illness that affects a person's brain. It causes a person to lose the ability to remember things and make good decisions. As the disease progresses, the person is unable to take care of himself or herself and needs more and more help to do simple tasks. Taking care of someone with Alzheimer disease can be very challenging and overwhelming.  MEMORY LOSS AND CONFUSION Memory loss and confusion is mild in the beginning stages of the disease. Both of these problems become more severe as the disease progresses. Eventually, the person will not recognize places or even close family members and friends.   Stay calm.  Respond with a short explanation. Long explanations can be overwhelming and confusing.  Avoid corrections that sound like scolding.  Try not to take it personally, even if the person forgets your name. BEHAVIOR CHANGES Behavior changes are part of the disease. The person may develop depression, anxiety, anger, hallucinations, or other behavior changes. These changes can come on suddenly and may be in response to pain, infection, changes in the environment (temperature, noise), overstimulation, or feeling lost or scared.   Try not to take behavior changes personally.  Remain calm and patient.  Do not argue or try to convince the person about a specific point. This will only make him or her more agitated.  Know that the behavior changes are part of the disease process and try to work through it. TIPS TO REDUCE FRUSTRATION  Schedule wisely by making appointments and doing daily tasks, like bathing and dressing, when the person is at his or her best.  Take your time. Simple tasks may take a lot longer, so be sure to allow for plenty of time.  Limit choices. Too many choices can be overwhelming and stressful for the person.  Involve the person in what you are doing.  Stick to a routine.  Avoid new or crowded situations, if  possible.  Use simple words, short sentences, and a calm voice. Only give one direction at a time.  Buy clothes and shoes that are easy to put on and take off.  Let people help if they offer. HOME SAFETY Keeping the home safe is very important to reduce the risk of falls and injuries.   Keep floors clear of clutter. Remove rugs, magazine racks, and floor lamps.  Keep hallways well lit.  Put a handrail and nonslip mat in the bathtub or shower.  Put childproof locks on cabinets with dangerous items, such as medicine, alcohol, guns, toxic cleaning items, sharp tools or utensils, matches, or lighters.  Place locks on doors where the person cannot easily see or reach them. This helps ensure that the person cannot wander out of the house and get lost.  Be prepared for emergencies. Keep a list of emergency phone numbers and addresses in a convenient area. PLANS FOR THE FUTURE  Do not put off talking about finances.  Talk about money management. People with Alzheimer disease have trouble managing their money as the disease gets worse.  Get help from professional advisors regarding financial and legal matters.  Do not put off talking about future care.  Choose a power of attorney. This is someone who can make decisions for the person with Alzheimer disease when he or she is no longer able to do so.  Talk about driving and when it is the right time to stop. The person's health care provider can help give advice on this matter.  Talk about   the person's living situation. If he or she lives alone, you need to make sure he or she is safe. Some people need extra help at home, and others need more care at a nursing home or care center. SUPPORT GROUPS Joining a support group can be very helpful for caregivers of people with Alzheimer disease. Some advantages to being part of a support group include:   Getting strategies to manage stress.  Sharing experiences with others.  Receiving  emotional comfort and support.  Learning new caregiving skills as the disease progresses.  Knowing what community resources are available and taking advantage of them. SEEK MEDICAL CARE IF:  The person has a fever.  The person has a sudden change in behavior that does not improve with calming strategies.  The person is unable to manage in his or her current living situation.  The person threatens you or anyone else, including himself or herself.  You are no longer able to care for the person. Document Released: 06/08/2004 Document Revised: 02/11/2014 Document Reviewed: 11/03/2011 Exit[employer] Patient Information 2015 [employer]. This information is not intended to replace advice given to you by your health care provider. Make sure you discuss any questions you have with your health care provider.  

## 2014-11-15 ENCOUNTER — Telehealth: Payer: Self-pay | Admitting: Cardiovascular Disease

## 2014-11-15 NOTE — Telephone Encounter (Signed)
[name] called, notes that she observed swelling in his ankles and feet the other night. This is 1st observation, though she notes she has never paid much attention to his feet and only noticed because he was doing nail care.  He is not currently on diuretics.  She is cautious about the possible need for diuretics, as he has urinary incontinence and has been well-managed for this on Myrbetriq.  Concerned about BP changes as well.  States typical BPs 100/60, HR 50-60.   139/63 when checked at neurology appt yesterday.  She denies findings of SOB, fatigue, but does note his activity level has decreased recently (less walking outdoors w/in past month or so). He has dementia but she reports he performs all of his own ADLs w/ exception of medication administration.  Routing to Dr. Loletha Grayer to advise if he needs appt/consideration of options.

## 2014-11-15 NOTE — Telephone Encounter (Signed)
Daniel [name] is calling because her dad ankles and feet are very swollen . Please call   Thanks

## 2014-11-15 NOTE — Telephone Encounter (Signed)
Left message to call back  

## 2014-11-15 NOTE — Telephone Encounter (Signed)
[name] voiced understanding regarding dietary recommendations. She does state he eats a lot of microwaved meals due to easier preparation, but she has a sister on a heart-healthy salt-reduced diet who she will get recommendations from. I suggested best to avoid the microwaved meals when possible. For prepared options that he could reheat, we discussed doing crock-pot meals or home-made soups w/ low sodium broth.   I am sending heart healthy teaching materials in outgoing mail.  Weight tracking was also discussed in detail and daughter verbalizes understanding of when to call for weight changes.

## 2014-11-15 NOTE — Telephone Encounter (Signed)
As long as he does not have dyspnea, I would probably just monitor the swelling. Make sure he is not eating salty foods or adding salt to his food. Would be helpful to get in the habit of daily weights around same tome of day, as a monitor for fluid gain.His last weight in the computer on 2/3 was 160 lb, 2-3 lb more than his usual. Please call if weight reaches 163 or more

## 2014-11-25 ENCOUNTER — Encounter: Payer: Self-pay | Admitting: Cardiovascular Disease

## 2014-11-25 ENCOUNTER — Ambulatory Visit (INDEPENDENT_AMBULATORY_CARE_PROVIDER_SITE_OTHER): Payer: Medicare Other | Admitting: *Deleted

## 2014-11-25 DIAGNOSIS — I441 Atrioventricular block, second degree: Secondary | ICD-10-CM | POA: Diagnosis not present

## 2014-11-25 LAB — MDC_IDC_ENUM_SESS_TYPE_REMOTE
Battery Remaining Longevity: 104 mo
Battery Remaining Percentage: 95.5 %
Battery Voltage: 3.01 V
Brady Statistic AP VP Percent: 46 %
Brady Statistic AP VS Percent: 1 %
Brady Statistic RA Percent Paced: 46 %
Brady Statistic RV Percent Paced: 99 %
Implantable Pulse Generator Model: 2240
Implantable Pulse Generator Serial Number: 7602208
Lead Channel Impedance Value: 580 Ohm
Lead Channel Pacing Threshold Amplitude: 0.5 V
Lead Channel Pacing Threshold Amplitude: 0.75 V
Lead Channel Pacing Threshold Pulse Width: 0.5 ms
Lead Channel Pacing Threshold Pulse Width: 0.5 ms
Lead Channel Sensing Intrinsic Amplitude: 2.9 mV
Lead Channel Setting Pacing Amplitude: 2 V
Lead Channel Setting Pacing Amplitude: 2.5 V
Lead Channel Setting Pacing Pulse Width: 0.5 ms
MDC IDC MSMT LEADCHNL RV IMPEDANCE VALUE: 530 Ohm
MDC IDC MSMT LEADCHNL RV SENSING INTR AMPL: 12 mV
MDC IDC SESS DTM: 20160215070015
MDC IDC SET LEADCHNL RV SENSING SENSITIVITY: 4 mV
MDC IDC STAT BRADY AS VP PERCENT: 54 %
MDC IDC STAT BRADY AS VS PERCENT: 1 %

## 2014-11-25 NOTE — Progress Notes (Signed)
Remote pacemaker transmission.   

## 2014-11-27 ENCOUNTER — Ambulatory Visit (INDEPENDENT_AMBULATORY_CARE_PROVIDER_SITE_OTHER): Payer: Medicare Other | Admitting: Emergency Medicine

## 2014-11-27 VITALS — BP 116/50 | HR 64 | Temp 97.9°F | Resp 17 | Ht 66.75 in | Wt 159.4 lb

## 2014-11-27 DIAGNOSIS — F0390 Unspecified dementia without behavioral disturbance: Secondary | ICD-10-CM

## 2014-11-27 DIAGNOSIS — K140 Glossitis: Secondary | ICD-10-CM

## 2014-11-27 LAB — POCT CBC
Granulocyte percent: 52 %G (ref 37–80)
HCT, POC: 43.6 % (ref 43.5–53.7)
HEMOGLOBIN: 13.4 g/dL — AB (ref 14.1–18.1)
Lymph, poc: 2.9 (ref 0.6–3.4)
MCH: 32 pg — AB (ref 27–31.2)
MCHC: 30.8 g/dL — AB (ref 31.8–35.4)
MCV: 103.7 fL — AB (ref 80–97)
MID (cbc): 0.7 (ref 0–0.9)
MPV: 6.8 fL (ref 0–99.8)
POC Granulocyte: 4 (ref 2–6.9)
POC LYMPH PERCENT: 38.5 %L (ref 10–50)
POC MID %: 9.5 %M (ref 0–12)
Platelet Count, POC: 226 10*3/uL (ref 142–424)
RBC: 4.2 M/uL — AB (ref 4.69–6.13)
RDW, POC: 16.1 %
WBC: 7.6 10*3/uL (ref 4.6–10.2)

## 2014-11-27 LAB — POCT SKIN KOH: Skin KOH, POC: NEGATIVE

## 2014-11-27 LAB — GLUCOSE, POCT (MANUAL RESULT ENTRY): POC GLUCOSE: 82 mg/dL (ref 70–99)

## 2014-11-27 MED ORDER — CLOTRIMAZOLE 10 MG MT TROC: 10.0000 mg | Freq: Every day | OROMUCOSAL | Status: DC

## 2014-11-27 NOTE — Patient Instructions (Signed)
Glossitis Glossitis is an inflammation of the tongue. Changes in the appearance of the tongue may be a primary tongue disorder. This means the problem is only in the tongue. Glossitis may be a symptom of other disorders. CAUSES   Excessive alcohol.  Multiple allergies.  Infections.  Tobacco and nicotine use.  Anemia.  Mechanical injury.  Spicy foods.  Vitamin B deficiency.  Damage from chemicals or hot food or drink. SYMPTOMS  There may be swelling and color changes in the tongue. Sometimes the surface of the tongue may look smooth. This disorder may be painless. But the tongue is usually sore and tender. It can be fiery red if condition is caused by deficiency of B vitamins. It is sometimes pale if there is anemia. Anemia means there are not enough red blood cells. There may be problems with chewing, swallowing and speaking. In some cases, glossitis may result in severe tongue swelling that blocks the airway. DIAGNOSIS  The diagnosis of glossitis is made easily by physical exam and asking for a history. Sometimes blood tests may be done. TREATMENT   The goal of treatment is to reduce inflammation. Hospitalization is usually not necessary unless tongue swelling is severe.  Good oral hygiene is important. This means good tooth brushing at least twice a day, and flossing daily for treatment and prevention.  Corticosteroids such as prednisone may be given to reduce the redness and soreness.  Medications may be prescribed if the cause of glossitis is an infection.  Other problems such as anemia and nutritional deficiencies are treated. This may be a dietary change or vitamin supplements. Avoid hot or spicy foods, alcohol, and tobacco. This lessens the discomfort.  Avoid anything that is irritating to your mouth or tongue. Glossitis usually responds well to treatment if the cause of it is removed or treated.  SEEK IMMEDIATE MEDICAL CARE IF:   Symptoms of glossitis persist for  longer than 10 days.  Tongue swelling is severe and breathing, speaking, chewing, or swallowing difficulties are present.  You have no relief from medications given.  You develop difficulties breathing. Document Released: 09/17/2002 Document Revised: 12/20/2011 Document Reviewed: 11/01/2008 [facility] Patient Information 2015 [place], [place]. This information is not intended to replace advice given to you by your health care provider. Make sure you discuss any questions you have with your health care provider.

## 2014-11-27 NOTE — Progress Notes (Deleted)
   Subjective:    Patient ID: Daniel [name], male    DOB: 1921-02-12, 81 y.o.   MRN: [id number]  HPI    Review of Systems     Objective:   Physical Exam        Assessment & Plan:

## 2014-11-27 NOTE — Progress Notes (Addendum)
Subjective:    Patient ID: Daniel [name], male    DOB: 12-21-1920, 81 y.o.   MRN: [id number]  This chart was scribed for Jenny Reichmann, MD by Stephania Fragmin, ED Scribe. This patient was seen in room 5 and the patient's care was started at 1:02 PM.   HPI   HPI Comments: Daniel [name] is a 81 y.o. male with a history of dementia (most recent score was 12/31) who presents to the [facility] complaining of a black, "hairy" tongue, per daughter. Daughter states he complains of left otalgia when putting in his hearing aid. He last has a TSH test 10 months ago, which returned negative, per medical records.  Patient Active Problem List   Diagnosis Date Noted  . Syncope and collapse 01/04/2014  . Cardiac pacemaker implanted- [employer] - 01/03/14 01/04/2014  . Acute diastolic heart failure 35/46/5681  . Asthma 01/02/2014  . Heart block 01/02/2014  . Heart block AV second degree 01/02/2014  . BPH (benign prostatic hyperplasia) 01/24/2013  . GERD (gastroesophageal reflux disease) 01/24/2013  . HTN (hypertension) 01/24/2013  . Other and unspecified hyperlipidemia 01/24/2013  . Diverticular disease of colon 01/24/2013  . Alzheimer's disease 01/03/2013   Past Medical History  Diagnosis Date  . Memory loss   . OCD (obsessive compulsive disorder)   . Dysphagia     Mild  . Gastroesophageal reflux disease   . Degenerative arthritis   . Dyslipidemia   . Hearing loss   . Diverticulitis   . Left bundle branch block   . Nocturnal leg cramps   . Allergy   . Seizures   . Substance abuse   . Ulcer   . Cataract   . Anxiety   . Hypertension   . Hearing difficulty     bilateral hearing aids   Past Surgical History  Procedure Laterality Date  . Tonsillectomy    . Turp vaporization    . Cataract extraction Bilateral   . Basal cell carcinoma excision    . Prostate surgery    . Eye surgery    . Permanent pacemaker insertion N/A 01/03/2014    Procedure: PERMANENT PACEMAKER  INSERTION;  Surgeon: Sanda Klein, MD;  Location: [facility];  Service: Cardiovascular;  Laterality: N/A;   Allergies  Allergen Reactions  . Sanctura [Trospium]     Seizures, body spasms  . Biaxin [Clarithromycin] Hives   Prior to Admission medications   Medication Sig Start Date End Date Taking? Authorizing Provider  aspirin 81 MG tablet Take 81 mg by mouth daily.   Yes Historical Provider, MD  cholecalciferol (VITAMIN D) 1000 UNITS tablet Take 1,000 Units by mouth daily.   Yes Historical Provider, MD  donepezil (ARICEPT) 5 MG tablet Take 1 tablet (5 mg total) by mouth 2 (two) times daily. 09/02/14  Yes Kathrynn Ducking, MD  fluticasone [facility]) 50 MCG/ACT nasal spray Place 2 sprays into the nose daily.   Yes Historical Provider, MD  mirabegron ER (MYRBETRIQ) 50 MG TB24 tablet Take 50 mg by mouth daily.   Yes Historical Provider, MD  Multiple Vitamins-Minerals (PRESERVISION AREDS) TABS Take 1 tablet by mouth daily.   Yes Historical Provider, MD  NAMENDA 10 MG tablet TAKE 1 TABLET (10 MG TOTAL) BY MOUTH 2 (TWO) TIMES DAILY.   Yes Kathrynn Ducking, MD  omeprazole (PRILOSEC) 20 MG capsule TAKE ONE CAPSULE BY MOUTH EVERY MORNING ON EMPTY STOMACH 07/14/14  Yes Leandrew Koyanagi, MD  sertraline (  ZOLOFT) 100 MG tablet Take 1 tablet (100 mg total) by mouth daily. 06/13/14  Yes Kathrynn Ducking, MD  Polyethyl Glycol-Propyl Glycol (SYSTANE OP) Apply to eye.    Historical Provider, MD   History   Social History  . Marital Status: Widowed    Spouse Name: N/A  . Number of Children: N/A  . Years of Education: N/A   Occupational History  . Retired    Social History Main Topics  . Smoking status: Former Research scientist (life sciences)  . Smokeless tobacco: Never Used  . Alcohol Use: No     Comment: History of alcoholism, no longer drinking  . Drug Use: No  . Sexual Activity: No   Other Topics Concern  . Not on file   Social History Narrative   Widow. Education: [employer]. Exercise walking 2 times a day for 20  minutes.      Review of Systems  HENT: Positive for ear pain.        Tongue discoloration        Objective:   Physical Exam  Nursing note and vitals reviewed.  CONSTITUTIONAL: Well developed/well nourished HEAD: Normocephalic/atraumatic EYES: EOMI/PERRL ENMT: Mucous membranes moist; black, hairy tongue; cerumen in the left external auditory canal  NECK: supple no meningeal signs; no cervical adenopathy SPINE/BACK:entire spine nontender CV: S1/S2 noted, no murmurs/rubs/gallops noted LUNGS: Lungs are clear to auscultation bilaterally, no apparent distress ABDOMEN: soft, nontender, no rebound or guarding, bowel sounds noted throughout abdomen GU:no cva tenderness NEURO: Pt is awake/alert/appropriate, moves all extremitiesx4.  No facial droop.   EXTREMITIES: pulses normal/equal, full ROM SKIN: warm, color normal PSYCH: no abnormalities of mood noted, alert and oriented to situation  Results for orders placed or performed in visit on 11/27/14  POCT Skin KOH  Result Value Ref Range   Skin KOH, POC Negative   POCT CBC  Result Value Ref Range   WBC 7.6 4.6 - 10.2 K/uL   Lymph, poc 2.9 0.6 - 3.4   POC LYMPH PERCENT 38.5 10 - 50 %L   MID (cbc) 0.7 0 - 0.9   POC MID % 9.5 0 - 12 %M   POC Granulocyte 4.0 2 - 6.9   Granulocyte percent 52.0 37 - 80 %G   RBC 4.20 (A) 4.69 - 6.13 M/uL   Hemoglobin 13.4 (A) 14.1 - 18.1 g/dL   HCT, POC 43.6 43.5 - 53.7 %   MCV 103.7 (A) 80 - 97 fL   MCH, POC 32.0 (A) 27 - 31.2 pg   MCHC 30.8 (A) 31.8 - 35.4 g/dL   RDW, POC 16.1 %   Platelet Count, POC 226 142 - 424 K/uL   MPV 6.8 0 - 99.8 fL  POCT glucose (manual entry)  Result Value Ref Range   POC Glucose 82 70 - 99 mg/dl      Assessment & Plan:  We'll treat with Mycelex troches. B12 level was done and pending.I personally performed the services described in this documentation, which was scribed in my presence. The recorded information has been reviewed and is accurate.

## 2014-11-28 LAB — T4, FREE: Free T4: 1.11 ng/dL (ref 0.80–1.80)

## 2014-11-28 LAB — VITAMIN B12: Vitamin B-12: 565 pg/mL (ref 211–911)

## 2014-11-28 LAB — TSH: TSH: 1.529 u[IU]/mL (ref 0.350–4.500)

## 2014-11-29 ENCOUNTER — Other Ambulatory Visit: Payer: Self-pay | Admitting: Neurology

## 2014-12-04 ENCOUNTER — Encounter: Payer: Self-pay | Admitting: Internal Medicine

## 2014-12-05 ENCOUNTER — Other Ambulatory Visit: Payer: Self-pay | Admitting: Dermatology

## 2014-12-05 DIAGNOSIS — L729 Follicular cyst of the skin and subcutaneous tissue, unspecified: Secondary | ICD-10-CM | POA: Diagnosis not present

## 2014-12-05 DIAGNOSIS — Z85828 Personal history of other malignant neoplasm of skin: Secondary | ICD-10-CM | POA: Diagnosis not present

## 2014-12-05 DIAGNOSIS — M71341 Other bursal cyst, right hand: Secondary | ICD-10-CM | POA: Diagnosis not present

## 2014-12-05 DIAGNOSIS — M71442 Calcium deposit in bursa, left hand: Secondary | ICD-10-CM | POA: Diagnosis not present

## 2014-12-10 ENCOUNTER — Encounter: Payer: Self-pay | Admitting: Cardiology

## 2014-12-13 ENCOUNTER — Telehealth: Payer: Self-pay | Admitting: Cardiovascular Disease

## 2014-12-13 NOTE — Telephone Encounter (Signed)
Defer question to Dr. Loletha Grayer.

## 2014-12-13 NOTE — Telephone Encounter (Signed)
This is not a new problem.He had high grade second degree AV block as the reason for initial device implantation. This has slowly progressed over the years to third degree AV block, as is typical over time. This means he is pacemaker dependent (no rhythm without the pacemaker). This is not related to coronary blockages. Please let me know if this explanation is not sufficient.

## 2014-12-13 NOTE — Telephone Encounter (Signed)
Daniel [name] called in stating that when she looked online at the results the the remote check , it showed that there was a 3rd degree AV blockage. She would like to discuss this with Dr. Loletha Grayer  Thanks

## 2014-12-16 DIAGNOSIS — B351 Tinea unguium: Secondary | ICD-10-CM | POA: Diagnosis not present

## 2014-12-16 DIAGNOSIS — M79674 Pain in right toe(s): Secondary | ICD-10-CM | POA: Diagnosis not present

## 2014-12-16 DIAGNOSIS — M79675 Pain in left toe(s): Secondary | ICD-10-CM | POA: Diagnosis not present

## 2014-12-16 NOTE — Telephone Encounter (Signed)
Can this encounter be closed?

## 2014-12-16 NOTE — Telephone Encounter (Signed)
Called daughter with Dr. Lurline Del explanation of his device interrogation.  Voiced understanding.

## 2015-01-15 ENCOUNTER — Encounter: Payer: Self-pay | Admitting: Internal Medicine

## 2015-01-23 MED ORDER — FLUTICASONE PROPIONATE 50 MCG/ACT NA SUSP: 2.0000 | Freq: Every day | NASAL | Status: DC

## 2015-01-24 ENCOUNTER — Encounter: Payer: Self-pay | Admitting: Internal Medicine

## 2015-03-04 ENCOUNTER — Ambulatory Visit (INDEPENDENT_AMBULATORY_CARE_PROVIDER_SITE_OTHER): Payer: Medicare Other | Admitting: Cardiovascular Disease

## 2015-03-04 ENCOUNTER — Encounter: Payer: Self-pay | Admitting: Cardiovascular Disease

## 2015-03-04 VITALS — BP 120/66 | HR 60 | Ht 67.0 in | Wt 161.1 lb

## 2015-03-04 DIAGNOSIS — I441 Atrioventricular block, second degree: Secondary | ICD-10-CM | POA: Diagnosis not present

## 2015-03-04 DIAGNOSIS — Z950 Presence of cardiac pacemaker: Secondary | ICD-10-CM | POA: Diagnosis not present

## 2015-03-04 LAB — CUP PACEART INCLINIC DEVICE CHECK
Battery Remaining Longevity: 108 mo
Brady Statistic RA Percent Paced: 46 %
Brady Statistic RV Percent Paced: 99.78 %
Lead Channel Impedance Value: 487.5 Ohm
Lead Channel Impedance Value: 525 Ohm
Lead Channel Pacing Threshold Amplitude: 0.75 V
Lead Channel Pacing Threshold Amplitude: 0.75 V
Lead Channel Pacing Threshold Amplitude: 0.75 V
Lead Channel Pacing Threshold Pulse Width: 0.5 ms
Lead Channel Pacing Threshold Pulse Width: 0.5 ms
Lead Channel Pacing Threshold Pulse Width: 0.5 ms
Lead Channel Sensing Intrinsic Amplitude: 12 mV
Lead Channel Setting Pacing Amplitude: 2 V
Lead Channel Setting Pacing Pulse Width: 0.5 ms
Lead Channel Setting Sensing Sensitivity: 4 mV
MDC IDC MSMT BATTERY VOLTAGE: 3.01 V
MDC IDC MSMT LEADCHNL RA PACING THRESHOLD AMPLITUDE: 0.75 V
MDC IDC MSMT LEADCHNL RA SENSING INTR AMPL: 3.6 mV
MDC IDC MSMT LEADCHNL RV PACING THRESHOLD PULSEWIDTH: 0.5 ms
MDC IDC PG SERIAL: 7602208
MDC IDC SESS DTM: 20160524131958
MDC IDC SET LEADCHNL RV PACING AMPLITUDE: 2.5 V
Pulse Gen Model: 2240

## 2015-03-04 NOTE — Progress Notes (Signed)
Patient ID: Daniel [name], male   DOB: 03-10-1921, 80 y.o.   MRN: [id number]     Cardiology Office Note   Date:  03/04/2015   ID:  KYON BENTLER, DOB August 30, 1921, MRN [id number]  PCP:  Leandrew Koyanagi, MD  Cardiologist:   Sanda Klein, MD   Chief Complaint  Patient presents with  . Follow-up    No complaints of chest pain, SOB or dizziness.  Per daughter he has occas. ankle edema.        History of Present Illness: Daniel [name] is a 81 y.o. male who presents for second degree atrial ventricular block and pacemaker follow-up. He has not had cardiac problems since his last appointment but has progressively worsening dementia with features of sundowning. He no longer enjoys walking and is worried about falling. He does not have intermittent claudication but complains about "cold feet". Told by his podiatrist that his pulses are no longer palpable in his feet but he has excellent dorsalis pedis bilaterally on my examination. He does have a weaker left posterior tibial compared to his right. He denies syncope or shortness of breath or lower extremity edema. At presentation she had acute diastolic heart failure related to extreme bradycardia in the setting of high-grade second-degree AV block.  Interrogation of his pacemaker shows normal device function. He has virtually 100% ventricular pacing but when we extended his AV delay today we did see one-to-one AV conduction with a long AV interval. VIP was turned on. He also has 46% atrial pacing. A handful of episodes of mode switch all lasted less than a minute in duration and were asymptomatic. There is no evidence of atrial fibrillation. Pacemaker generator is essentially at beginning of life.   Past Medical History  Diagnosis Date  . Memory loss   . OCD (obsessive compulsive disorder)   . Dysphagia     Mild  . Gastroesophageal reflux disease   . Degenerative arthritis   . Dyslipidemia   . Hearing loss   . Diverticulitis   . Left  bundle branch block   . Nocturnal leg cramps   . Allergy   . Seizures   . Substance abuse   . Ulcer   . Cataract   . Anxiety   . Hypertension   . Hearing difficulty     bilateral hearing aids    Past Surgical History  Procedure Laterality Date  . Tonsillectomy    . Turp vaporization    . Cataract extraction Bilateral   . Basal cell carcinoma excision    . Prostate surgery    . Eye surgery    . Permanent pacemaker insertion N/A 01/03/2014    Procedure: PERMANENT PACEMAKER INSERTION;  Surgeon: Sanda Klein, MD;  Location: [place];  Service: Cardiovascular;  Laterality: N/A;     Current Outpatient Prescriptions  Medication Sig Dispense Refill  . aspirin 81 MG tablet Take 81 mg by mouth daily.    . cholecalciferol (VITAMIN D) 1000 UNITS tablet Take 2,000 Units by mouth daily.     Marland Kitchen donepezil (ARICEPT) 5 MG tablet Take 1 tablet (5 mg total) by mouth 2 (two) times daily. 60 tablet 5  . fluticasone (FLONASE) 50 MCG/ACT nasal spray Place 2 sprays into both nostrils daily. 15.8 g 11  . mirabegron ER (MYRBETRIQ) 50 MG TB24 tablet Take 50 mg by mouth daily.    . Multiple Vitamins-Minerals (PRESERVISION AREDS) TABS Take 1 tablet by mouth daily.    Marland Kitchen NAMENDA 10 MG tablet  TAKE 1 TABLET (10 MG TOTAL) BY MOUTH 2 (TWO) TIMES DAILY. 180 tablet 1  . omeprazole (PRILOSEC) 20 MG capsule TAKE ONE CAPSULE BY MOUTH EVERY MORNING ON EMPTY STOMACH 30 capsule 11  . Polyethyl Glycol-Propyl Glycol (SYSTANE OP) Apply to eye.    . sertraline (ZOLOFT) 100 MG tablet Take 1 tablet (100 mg total) by mouth daily. 90 tablet 3   No current facility-administered medications for this visit.    Allergies:   Sanctura and Biaxin    Social History:  The patient  reports that he has quit smoking. He has never used smokeless tobacco. He reports that he does not drink alcohol or use illicit drugs.   Family History:  The patient's family history includes Dementia in his father; Heart attack in his brother,  brother, mother, sister, and sister; Heart disease in his daughter, paternal grandmother, and sister; Stroke in his brother.    ROS:  Please see the history of present illness.    Otherwise, review of systems positive for none.   All other systems are reviewed and negative.    PHYSICAL EXAM: VS:  BP 120/66 mmHg  Pulse 60  Ht 5\' 7"  (1.702 m)  Wt 73.074 kg (161 lb 1.6 oz)  BMI 25.23 kg/m2 , BMI Body mass index is 25.23 kg/(m^2).  General: Alert, oriented x3, no distress Head: no evidence of trauma, PERRL, EOMI, no exophtalmos or lid lag, no myxedema, no xanthelasma; normal ears, nose and oropharynx Neck: normal jugular venous pulsations and no hepatojugular reflux; brisk carotid pulses without delay and no carotid bruits Chest: clear to auscultation, no signs of consolidation by percussion or palpation, normal fremitus, symmetrical and full respiratory excursions Cardiovascular: normal position and quality of the apical impulse, regular rhythm, normal first and second heart sounds, no murmurs, rubs or gallops Abdomen: no tenderness or distention, no masses by palpation, no abnormal pulsatility or arterial bruits, normal bowel sounds, no hepatosplenomegaly Extremities: no clubbing, cyanosis or edema; 2+ radial, ulnar and brachial pulses bilaterally; 2+ right femoral, posterior tibial and dorsalis pedis pulses; 2+ left femoral, 1+ posterior tibial and 2+ dorsalis pedis pulses; no subclavian or femoral bruits Neurological: grossly nonfocal Psych: euthymic mood, full affect   EKG:  EKG is ordered today. The ekg ordered today demonstrates AV sequential pacing   Recent Labs: 11/27/2014: Hemoglobin 13.4*; TSH 1.529    Lipid Panel No results found for: CHOL, TRIG, HDL, CHOLHDL, VLDL, LDLCALC, LDLDIRECT    Wt Readings from Last 3 Encounters:  03/04/15 73.074 kg (161 lb 1.6 oz)  11/27/14 72.303 kg (159 lb 6.4 oz)  11/13/14 72.666 kg (160 lb 3.2 oz)     ASSESSMENT AND PLAN:  1.  History of high-grade second degree AV block - high-frequency ventricular pacing may be inevitable, but will try to diminish it by using  VIP. No other programming changes were necessary. Continue merlin downloads every 3 months. Follow-up in office yearly.  2. Possible peripheral arterial disease - previous CT of the abdomen incidentally showed fairly extensive aortoiliac atherosclerosis, but in view of his advanced age, progressive dementia and the lack of serious symptoms of arterial insufficiency, further evaluation does not appear indicated at this time. May need to reconsider if he shows evidence of impending digit/limb loss, but this is definitely not the case at this time. Continue aspirin.  3. Hyperlipidemia - after taking Crestor for many years, statin therapy was stopped when he developed cognitive decline. The benefit of statin therapy is quite questionable at this  point.   Current medicines are reviewed at length with the patient today.  The patient does not have concerns regarding medicines.  The following changes have been made:  no change  Labs/ tests ordered today include:  Orders Placed This Encounter  Procedures  . Implantable device check  . EKG 12-Lead   Patient Instructions  Remote monitoring is used to monitor your Pacemaker from home. This monitoring reduces the number of office visits required to check your device to one time per year. It allows [place] to monitor the functioning of your device to ensure it is working properly. You are scheduled for a device check from home on June 05, 2015. You may send your transmission at any time that day. If you have a wireless device, the transmission will be sent automatically. After your physician reviews your transmission, you will receive a postcard with your next transmission date.  Dr. Sallyanne Kuster recommends that you schedule a follow-up appointment in: ONE YEAR.     Mikael Spray, MD  03/04/2015 4:33 PM    Sanda Klein, MD, [facility] [phone] office [phone] pager

## 2015-03-04 NOTE — Patient Instructions (Signed)
Remote monitoring is used to monitor your Pacemaker from home. This monitoring reduces the number of office visits required to check your device to one time per year. It allows [place] to monitor the functioning of your device to ensure it is working properly. You are scheduled for a device check from home on June 05, 2015. You may send your transmission at any time that day. If you have a wireless device, the transmission will be sent automatically. After your physician reviews your transmission, you will receive a postcard with your next transmission date.  Dr. Sallyanne Kuster recommends that you schedule a follow-up appointment in: ONE YEAR.

## 2015-03-05 DIAGNOSIS — N3281 Overactive bladder: Secondary | ICD-10-CM | POA: Diagnosis not present

## 2015-03-05 DIAGNOSIS — N3941 Urge incontinence: Secondary | ICD-10-CM | POA: Diagnosis not present

## 2015-03-07 ENCOUNTER — Telehealth: Payer: Self-pay | Admitting: Cardiovascular Disease

## 2015-03-07 ENCOUNTER — Other Ambulatory Visit: Payer: Self-pay | Admitting: Neurology

## 2015-03-07 NOTE — Telephone Encounter (Signed)
[name] called in wanting to report that since Dr. Loletha Grayer has adjusted the pt's pace maker, his BP has been running around 159/600 and 148/68. She wanted to know is this something she should be concerned about. Please call  Thanks

## 2015-03-07 NOTE — Telephone Encounter (Signed)
Returned call to patient's daughter Mateo Flow.She stated father had pacemaker adjusted last week and when he saw his urologist this past Wed 03/05/15 B/P elevated 159/64,148/68.She wanted to know if having pacemaker adjusted would elevate B/P.Stated father's B/P is normally 116/60,120/60.Advised having pacemaker adjusted will not elevate B/P.Stated she will check father's B/P and call back next week with readings.Message sent to Dr.Croitoru.

## 2015-03-11 NOTE — Telephone Encounter (Signed)
No, the pacemaker adjustments will have no impact on BP. Is it still high?

## 2015-03-18 NOTE — Telephone Encounter (Signed)
Has this been taken care of?

## 2015-03-25 ENCOUNTER — Encounter: Payer: Self-pay | Admitting: Cardiovascular Disease

## 2015-04-07 DIAGNOSIS — H3531 Nonexudative age-related macular degeneration: Secondary | ICD-10-CM | POA: Diagnosis not present

## 2015-04-17 DIAGNOSIS — L821 Other seborrheic keratosis: Secondary | ICD-10-CM | POA: Diagnosis not present

## 2015-04-17 DIAGNOSIS — L570 Actinic keratosis: Secondary | ICD-10-CM | POA: Diagnosis not present

## 2015-04-17 DIAGNOSIS — Z85828 Personal history of other malignant neoplasm of skin: Secondary | ICD-10-CM | POA: Diagnosis not present

## 2015-04-21 ENCOUNTER — Encounter (HOSPITAL_COMMUNITY): Payer: Self-pay

## 2015-04-21 ENCOUNTER — Emergency Department (HOSPITAL_COMMUNITY)
Admission: EM | Admit: 2015-04-21 | Discharge: 2015-04-21 | Disposition: A | Discharge status: Home / Self Care | Payer: Medicare Other | Attending: Emergency Medicine | Admitting: Emergency Medicine

## 2015-04-21 ENCOUNTER — Emergency Department (HOSPITAL_COMMUNITY): Payer: Medicare Other

## 2015-04-21 DIAGNOSIS — R1030 Lower abdominal pain, unspecified: Secondary | ICD-10-CM | POA: Diagnosis not present

## 2015-04-21 DIAGNOSIS — Z8659 Personal history of other mental and behavioral disorders: Secondary | ICD-10-CM | POA: Diagnosis not present

## 2015-04-21 DIAGNOSIS — Z7982 Long term (current) use of aspirin: Secondary | ICD-10-CM | POA: Diagnosis not present

## 2015-04-21 DIAGNOSIS — Z7951 Long term (current) use of inhaled steroids: Secondary | ICD-10-CM | POA: Diagnosis not present

## 2015-04-21 DIAGNOSIS — Z950 Presence of cardiac pacemaker: Secondary | ICD-10-CM | POA: Insufficient documentation

## 2015-04-21 DIAGNOSIS — Z8639 Personal history of other endocrine, nutritional and metabolic disease: Secondary | ICD-10-CM | POA: Insufficient documentation

## 2015-04-21 DIAGNOSIS — Z8739 Personal history of other diseases of the musculoskeletal system and connective tissue: Secondary | ICD-10-CM | POA: Diagnosis not present

## 2015-04-21 DIAGNOSIS — I10 Essential (primary) hypertension: Secondary | ICD-10-CM | POA: Insufficient documentation

## 2015-04-21 DIAGNOSIS — Z87891 Personal history of nicotine dependence: Secondary | ICD-10-CM | POA: Insufficient documentation

## 2015-04-21 DIAGNOSIS — K219 Gastro-esophageal reflux disease without esophagitis: Secondary | ICD-10-CM | POA: Insufficient documentation

## 2015-04-21 DIAGNOSIS — H9193 Unspecified hearing loss, bilateral: Secondary | ICD-10-CM | POA: Insufficient documentation

## 2015-04-21 DIAGNOSIS — R1032 Left lower quadrant pain: Secondary | ICD-10-CM | POA: Diagnosis not present

## 2015-04-21 DIAGNOSIS — Z79899 Other long term (current) drug therapy: Secondary | ICD-10-CM | POA: Diagnosis not present

## 2015-04-21 DIAGNOSIS — R109 Unspecified abdominal pain: Secondary | ICD-10-CM

## 2015-04-21 DIAGNOSIS — F0390 Unspecified dementia without behavioral disturbance: Secondary | ICD-10-CM | POA: Diagnosis not present

## 2015-04-21 LAB — URINALYSIS, ROUTINE W REFLEX MICROSCOPIC
Bilirubin Urine: NEGATIVE
GLUCOSE, UA: NEGATIVE mg/dL
HGB URINE DIPSTICK: NEGATIVE
KETONES UR: NEGATIVE mg/dL
NITRITE: NEGATIVE
Protein, ur: NEGATIVE mg/dL
Specific Gravity, Urine: 1.004 — ABNORMAL LOW (ref 1.005–1.030)
Urobilinogen, UA: 0.2 mg/dL (ref 0.0–1.0)
pH: 6 (ref 5.0–8.0)

## 2015-04-21 LAB — COMPREHENSIVE METABOLIC PANEL
ALBUMIN: 4.1 g/dL (ref 3.5–5.0)
ALK PHOS: 60 U/L (ref 38–126)
ALT: 18 U/L (ref 17–63)
AST: 28 U/L (ref 15–41)
Anion gap: 10 (ref 5–15)
BILIRUBIN TOTAL: 0.5 mg/dL (ref 0.3–1.2)
BUN: 18 mg/dL (ref 6–20)
CO2: 26 mmol/L (ref 22–32)
Calcium: 9 mg/dL (ref 8.9–10.3)
Chloride: 100 mmol/L — ABNORMAL LOW (ref 101–111)
Creatinine, Ser: 1.22 mg/dL (ref 0.61–1.24)
GFR calc Af Amer: 58 mL/min — ABNORMAL LOW (ref >60)
GFR calc non Af Amer: 50 mL/min — ABNORMAL LOW (ref >60)
Glucose, Bld: 115 mg/dL — ABNORMAL HIGH (ref 65–99)
Potassium: 4.2 mmol/L (ref 3.5–5.1)
Sodium: 136 mmol/L (ref 135–145)
Total Protein: 7.1 g/dL (ref 6.5–8.1)

## 2015-04-21 LAB — CBC
HEMATOCRIT: 39.5 % (ref 39.0–52.0)
HEMOGLOBIN: 13.3 g/dL (ref 13.0–17.0)
MCH: 32.8 pg (ref 26.0–34.0)
MCHC: 33.7 g/dL (ref 30.0–36.0)
MCV: 97.3 fL (ref 78.0–100.0)
PLATELETS: 203 10*3/uL (ref 150–400)
RBC: 4.06 MIL/uL — AB (ref 4.22–5.81)
RDW: 13.8 % (ref 11.5–15.5)
WBC: 7.2 10*3/uL (ref 4.0–10.5)

## 2015-04-21 LAB — URINE MICROSCOPIC-ADD ON

## 2015-04-21 LAB — LIPASE, BLOOD: Lipase: 36 U/L (ref 22–51)

## 2015-04-21 MED ORDER — IOHEXOL 300 MG/ML  SOLN
50.0000 mL | Freq: Once | INTRAMUSCULAR | Status: AC | PRN
  Administered 2015-04-21: 50 mL via ORAL

## 2015-04-21 MED ORDER — IOHEXOL 300 MG/ML  SOLN
100.0000 mL | Freq: Once | INTRAMUSCULAR | Status: AC | PRN
  Administered 2015-04-21: 100 mL via INTRAVENOUS

## 2015-04-21 MED ORDER — ACETAMINOPHEN 500 MG PO TABS
1000.0000 mg | ORAL_TABLET | Freq: Once | ORAL | Status: AC
  Administered 2015-04-21: 1000 mg via ORAL
  Filled 2015-04-21: qty 2

## 2015-04-21 NOTE — Discharge Instructions (Signed)

## 2015-04-21 NOTE — ED Notes (Signed)
RN starting IV on pt.

## 2015-04-21 NOTE — ED Provider Notes (Signed)
CSN: [id number]     Arrival date & time 04/21/15  1411 History   First MD Initiated Contact with Patient 04/21/15 1517     Chief Complaint  Patient presents with  . Abdominal Pain     (Consider location/radiation/quality/duration/timing/severity/associated sxs/prior Treatment) HPI Comments: Patient with PMH of dementia, and diverticulitis presents to the ED with a chief complaint of abdominal pain.  Patient states that the pain started this morning.  Reports that it is sharp and cramping.  Mostly located on the left side of his abdomen.  He denies any n/v/d.  States that he had a normal BM this morning.  He has not taken anything for his symptoms.  The pain is aggravated with palpation.  The history is provided by the patient. No language interpreter was used.    Past Medical History  Diagnosis Date  . Memory loss   . OCD (obsessive compulsive disorder)   . Dysphagia     Mild  . Gastroesophageal reflux disease   . Degenerative arthritis   . Dyslipidemia   . Hearing loss   . Diverticulitis   . Left bundle branch block   . Nocturnal leg cramps   . Allergy   . Seizures   . Substance abuse   . Ulcer   . Cataract   . Anxiety   . Hypertension   . Hearing difficulty     bilateral hearing aids   Past Surgical History  Procedure Laterality Date  . Tonsillectomy    . Turp vaporization    . Cataract extraction Bilateral   . Basal cell carcinoma excision    . Prostate surgery    . Eye surgery    . Permanent pacemaker insertion N/A 01/03/2014    Procedure: PERMANENT PACEMAKER INSERTION;  Surgeon: Sanda Klein, MD;  Location: [place] [facility];  Service: Cardiovascular;  Laterality: N/A;   Family History  Problem Relation Age of Onset  . Heart attack Mother   . Dementia Father   . Heart attack Sister   . Heart disease Sister   . Heart attack Brother   . Heart attack Sister   . Heart attack Brother   . Stroke Brother   . Heart disease Daughter   . Heart disease Paternal  Grandmother    History  Substance Use Topics  . Smoking status: Former Research scientist (life sciences)  . Smokeless tobacco: Never Used  . Alcohol Use: No     Comment: History of alcoholism, no longer drinking    Review of Systems  Constitutional: Negative for fever and chills.  Respiratory: Negative for shortness of breath.   Cardiovascular: Negative for chest pain.  Gastrointestinal: Positive for abdominal pain. Negative for nausea, vomiting, diarrhea and constipation.  Genitourinary: Negative for dysuria.  All other systems reviewed and are negative.     Allergies  Sanctura and Biaxin  Home Medications   Prior to Admission medications   Medication Sig Start Date End Date Taking? Authorizing Provider  aspirin 81 MG tablet Take 81 mg by mouth daily.    Historical Provider, MD  cholecalciferol (VITAMIN D) 1000 UNITS tablet Take 2,000 Units by mouth daily.     Historical Provider, MD  donepezil (ARICEPT) 5 MG tablet TAKE 1 TABLET (5 MG TOTAL) BY MOUTH 2 (TWO) TIMES DAILY. 03/07/15   Kathrynn Ducking, MD  fluticasone (FLONASE) 50 MCG/ACT nasal spray Place 2 sprays into both nostrils daily. 01/23/15   Leandrew Koyanagi, MD  mirabegron ER (MYRBETRIQ) 50 MG TB24 tablet Take 50 mg  by mouth daily.    Historical Provider, MD  Multiple Vitamins-Minerals (PRESERVISION AREDS) TABS Take 1 tablet by mouth daily.    Historical Provider, MD  NAMENDA 10 MG tablet TAKE 1 TABLET (10 MG TOTAL) BY MOUTH 2 (TWO) TIMES DAILY. 11/29/14   Kathrynn Ducking, MD  omeprazole (PRILOSEC) 20 MG capsule TAKE ONE CAPSULE BY MOUTH EVERY MORNING ON EMPTY STOMACH 07/14/14   Leandrew Koyanagi, MD  Polyethyl Glycol-Propyl Glycol (SYSTANE OP) Apply to eye.    Historical Provider, MD  sertraline (ZOLOFT) 100 MG tablet Take 1 tablet (100 mg total) by mouth daily. 06/13/14   Kathrynn Ducking, MD   BP 132/56 mmHg  Pulse 64  Temp(Src) 98.2 F (36.8 C) (Oral)  Resp 18  Ht 5' 7.5" (1.715 m)  Wt 161 lb (73.029 kg)  BMI 24.83 kg/m2  SpO2  100% Physical Exam  Constitutional: He is oriented to person, place, and time. He appears well-developed and well-nourished.  HENT:  Head: Normocephalic and atraumatic.  Eyes: Conjunctivae and EOM are normal. Pupils are equal, round, and reactive to light. Right eye exhibits no discharge. Left eye exhibits no discharge. No scleral icterus.  Neck: Normal range of motion. Neck supple. No JVD present.  Cardiovascular: Normal rate, regular rhythm and normal heart sounds.  Exam reveals no gallop and no friction rub.   No murmur heard. Pulmonary/Chest: Effort normal and breath sounds normal. No respiratory distress. He has no wheezes. He has no rales. He exhibits no tenderness.  Abdominal: Soft. He exhibits no distension and no mass. There is tenderness. There is no rebound and no guarding.  LLQ abdomen is ttp, moderate upper abdominal tenderness  Musculoskeletal: Normal range of motion. He exhibits no edema or tenderness.  Neurological: He is alert and oriented to person, place, and time.  Skin: Skin is warm and dry.  Psychiatric: He has a normal mood and affect. His behavior is normal. Judgment and thought content normal.  Nursing note and vitals reviewed.   ED Course  Procedures (including critical care time) Results for orders placed or performed during the hospital encounter of 04/21/15  Lipase, blood  Result Value Ref Range   Lipase 36 22 - 51 U/L  Comprehensive metabolic panel  Result Value Ref Range   Sodium 136 135 - 145 mmol/L   Potassium 4.2 3.5 - 5.1 mmol/L   Chloride 100 (L) 101 - 111 mmol/L   CO2 26 22 - 32 mmol/L   Glucose, Bld 115 (H) 65 - 99 mg/dL   BUN 18 6 - 20 mg/dL   Creatinine, Ser 1.22 0.61 - 1.24 mg/dL   Calcium 9.0 8.9 - 10.3 mg/dL   Total Protein 7.1 6.5 - 8.1 g/dL   Albumin 4.1 3.5 - 5.0 g/dL   AST 28 15 - 41 U/L   ALT 18 17 - 63 U/L   Alkaline Phosphatase 60 38 - 126 U/L   Total Bilirubin 0.5 0.3 - 1.2 mg/dL   GFR calc non Af Amer 50 (L) >60 mL/min    GFR calc Af Amer 58 (L) >60 mL/min   Anion gap 10 5 - 15  CBC  Result Value Ref Range   WBC 7.2 4.0 - 10.5 K/uL   RBC 4.06 (L) 4.22 - 5.81 MIL/uL   Hemoglobin 13.3 13.0 - 17.0 g/dL   HCT 39.5 39.0 - 52.0 %   MCV 97.3 78.0 - 100.0 fL   MCH 32.8 26.0 - 34.0 pg   MCHC 33.7 30.0 -  36.0 g/dL   RDW 13.8 11.5 - 15.5 %   Platelets 203 150 - 400 K/uL  Urinalysis, Routine w reflex microscopic (not at [facility])  Result Value Ref Range   Color, Urine YELLOW YELLOW   APPearance CLOUDY (A) CLEAR   Specific Gravity, Urine 1.004 (L) 1.005 - 1.030   pH 6.0 5.0 - 8.0   Glucose, UA NEGATIVE NEGATIVE mg/dL   Hgb urine dipstick NEGATIVE NEGATIVE   Bilirubin Urine NEGATIVE NEGATIVE   Ketones, ur NEGATIVE NEGATIVE mg/dL   Protein, ur NEGATIVE NEGATIVE mg/dL   Urobilinogen, UA 0.2 0.0 - 1.0 mg/dL   Nitrite NEGATIVE NEGATIVE   Leukocytes, UA SMALL (A) NEGATIVE  Urine microscopic-add on  Result Value Ref Range   Squamous Epithelial / LPF RARE RARE   WBC, UA 3-6 <3 WBC/hpf   RBC / HPF 0-2 <3 RBC/hpf   Bacteria, UA RARE RARE   Ct Abdomen Pelvis W Contrast  04/21/2015   CLINICAL DATA:  Lower abdominal pain. Onset of symptoms this morning. Initial encounter.  EXAM: CT ABDOMEN AND PELVIS WITH CONTRAST  TECHNIQUE: Multidetector CT imaging of the abdomen and pelvis was performed using the standard protocol following bolus administration of intravenous contrast.  CONTRAST:  179mL OMNIPAQUE IOHEXOL 300 MG/ML  SOLN  COMPARISON:  02/19/2014 CT.  FINDINGS: Musculoskeletal: Lower lumbar facet arthrosis. No aggressive osseous lesions. Old RIGHT posterior rib fractures.  Lung Bases: Dependent atelectasis.  Liver: Tiny low-density lesion in the RIGHT hepatic lobe, probably a cyst. No change from CT over 1 year ago.  Spleen: Normal enhancement. Thickening is present along the splenic capsule which is better seen on the coronal reconstruction images than on the axial images due to motion artifact. This is a chronic finding and  probably due to old subcapsular splenic hematoma.  Gallbladder: Mild respiratory motion degrades evaluation. No gross abnormality.  Common bile duct:  Normal.  Pancreas:  Normal.  Adrenal glands:  Normal bilaterally.  Kidneys: Mild age expected renal cortical atrophy. LEFT ureter normal. RIGHT ureter normal.  Stomach:  Collapsed.  Small bowel: Tiny periampullary duodenal diverticulum. No small bowel obstruction or inflammatory change. Respiratory motion is present throughout imaging of the abdomen.  Colon: Normal appendix. No colonic inflammatory changes are present. Colonic diverticulosis without diverticulitis.  Pelvic Genitourinary: Urinary bladder appears within normal limits. Question prior TURP.  Peritoneum: No free fluid.  No free air.  Vascular/lymphatic: Atherosclerosis. No acute vascular abnormality. Partially visible pacemaker leads in the heart.  Body Wall: Tiny fat containing periumbilical hernia. Fat containing LEFT inguinal hernia.  IMPRESSION: No acute abnormality. No interval change compared to recent prior exam 02/19/2014.   Electronically Signed   By: Dereck Ligas M.D.   On: 04/21/2015 17:35      EKG Interpretation None      MDM   Final diagnoses:  Abdominal pain  Abdominal pain    Patient with abdominal pain that started suddenly today.  Hx of diverticulitis.  Will check labs, treat pain, and reassess.  Patient given tylenol for pain.  He has a hx of becoming combative with narcotics.  CT scan is negative.  Labs are reassuring.  Patient discussed with Dr. Tomi Bamberger, who agrees with plan for follow-up with PCP.  Patient re-examined, pain free at this time.  Patient is stable and ready for discharge.    Montine Circle, PA-C 04/21/15 1802  Dorie Rank, MD 04/21/15 [phone]

## 2015-04-21 NOTE — ED Notes (Signed)
Pt presents with c/o lower abdominal pain that started this morning. Pt has had no N/V/D. Pt reports the pain became worse after attempting to eat something.

## 2015-04-21 NOTE — ED Notes (Signed)
In radiology

## 2015-05-20 ENCOUNTER — Encounter: Payer: Self-pay | Admitting: Neurology

## 2015-05-20 ENCOUNTER — Ambulatory Visit (INDEPENDENT_AMBULATORY_CARE_PROVIDER_SITE_OTHER): Payer: Medicare Other | Admitting: Neurology

## 2015-05-20 VITALS — BP 129/62 | HR 59 | Ht 67.0 in | Wt 162.6 lb

## 2015-05-20 DIAGNOSIS — G309 Alzheimer's disease, unspecified: Secondary | ICD-10-CM | POA: Diagnosis not present

## 2015-05-20 DIAGNOSIS — F0280 Dementia in other diseases classified elsewhere without behavioral disturbance: Secondary | ICD-10-CM

## 2015-05-20 MED ORDER — MEMANTINE HCL 10 MG PO TABS: ORAL_TABLET | ORAL | Status: DC

## 2015-05-20 MED ORDER — DONEPEZIL HCL 5 MG PO TABS: ORAL_TABLET | ORAL | Status: DC

## 2015-05-20 NOTE — Progress Notes (Signed)
Reason for visit: Alzheimer's disease  Daniel [name] is an 81 y.o. male  History of present illness:  Daniel [name] is a 81 year old right-handed white male with a history of Alzheimer's disease. The patient has had some undulations in memory and cognitive status that occurred over time, he seemed to be much worse on his last visit in February 2016. Today, he is scoring very near to what he did one year ago on the Mini-Mental status examination. The patient lives alone, but his daughter lives next door. The patient is able to take care of his basic activities of daily living, he does not do his finances or drive a car, but he manages his own medications, heats up the food with a microwave. He does housework. The patient is relatively inactive, he sits around the house most of the day, does not want to get out and do things. He does have arthritis in the knees and hips, this makes it painful for him to initiate ambulation. He returns this office for an evaluation. He is on Aricept and Namenda.  Past Medical History  Diagnosis Date  . Memory loss   . OCD (obsessive compulsive disorder)   . Dysphagia     Mild  . Gastroesophageal reflux disease   . Degenerative arthritis   . Dyslipidemia   . Hearing loss   . Diverticulitis   . Left bundle branch block   . Nocturnal leg cramps   . Allergy   . Seizures   . Substance abuse   . Ulcer   . Cataract   . Anxiety   . Hypertension   . Hearing difficulty     bilateral hearing aids    Past Surgical History  Procedure Laterality Date  . Tonsillectomy    . Turp vaporization    . Cataract extraction Bilateral   . Basal cell carcinoma excision    . Prostate surgery    . Eye surgery    . Permanent pacemaker insertion N/A 01/03/2014    Procedure: PERMANENT PACEMAKER INSERTION;  Surgeon: Sanda Klein, MD;  Location: [facility];  Service: Cardiovascular;  Laterality: N/A;    Family History  Problem Relation Age of Onset  . Heart attack  Mother   . Dementia Father   . Heart attack Sister   . Heart disease Sister   . Heart attack Brother   . Heart attack Sister   . Heart attack Brother   . Stroke Brother   . Heart disease Daughter   . Heart disease Paternal Grandmother     Social history:  reports that he has quit smoking. He has never used smokeless tobacco. He reports that he does not drink alcohol or use illicit drugs.    Allergies  Allergen Reactions  . Sanctura [Trospium]     Seizures, body spasms  . Biaxin [Clarithromycin] Hives    Medications:  Prior to Admission medications   Medication Sig Start Date End Date Taking? Authorizing Provider  aspirin 81 MG tablet Take 81 mg by mouth daily.   Yes Historical Provider, MD  cholecalciferol (VITAMIN D) 1000 UNITS tablet Take 2,000 Units by mouth daily.    Yes Historical Provider, MD  donepezil (ARICEPT) 5 MG tablet TAKE 1 TABLET (5 MG TOTAL) BY MOUTH 2 (TWO) TIMES DAILY. 03/07/15  Yes Kathrynn Ducking, MD  fluticasone [facility]) 50 MCG/ACT nasal spray Place 2 sprays into both nostrils daily. 01/23/15  Yes Leandrew Koyanagi, MD  mirabegron ER (MYRBETRIQ) 50 MG  TB24 tablet Take 50 mg by mouth daily.   Yes Historical Provider, MD  Multiple Vitamins-Minerals (PRESERVISION AREDS) TABS Take 1 tablet by mouth daily.   Yes Historical Provider, MD  NAMENDA 10 MG tablet TAKE 1 TABLET (10 MG TOTAL) BY MOUTH 2 (TWO) TIMES DAILY. 11/29/14  Yes Kathrynn Ducking, MD  omeprazole (PRILOSEC) 20 MG capsule TAKE ONE CAPSULE BY MOUTH EVERY MORNING ON EMPTY STOMACH 07/14/14  Yes Leandrew Koyanagi, MD  Polyethyl Glycol-Propyl Glycol (SYSTANE OP) Place 1 drop into both eyes 2 (two) times daily.    Yes Historical Provider, MD  sertraline (ZOLOFT) 100 MG tablet Take 1 tablet (100 mg total) by mouth daily. 06/13/14  Yes Kathrynn Ducking, MD    ROS:  Out of a complete 14 system review of symptoms, the patient complains only of the following symptoms, and all other reviewed systems are  negative.  Decreased activity Hearing loss, runny nose Eye itching Cough Incontinence of bladder, frequency of urination Daytime drowsiness Joint pain, joint swelling, back pain, achy muscles, muscle cramps, walking difficulty Itching Memory loss, speech difficulty Confusion, decreased concentration  Blood pressure 129/62, pulse 59, height 5\' 7"  (1.702 m), weight 162 lb 9.6 oz (73.755 kg).  Physical Exam  General: The patient is alert and cooperative at the time of the examination.  Skin: No significant peripheral edema is noted.   Neurologic Exam  Mental status: The patient is alert and oriented x 2 at the time of the examination (not oriented to date). Mini-Mental Status Examination done today shows a total score of 19/30. He is able to name 4 animals in 30 seconds.   Cranial nerves: Facial symmetry is present. Speech is normal, no aphasia or dysarthria is noted. Extraocular movements are full. Visual fields are full. The patient is hard of hearing.  Motor: The patient has good strength in all 4 extremities.  Sensory examination: Soft touch sensation is symmetric on the face, arms, and legs.  Coordination: The patient has good finger-nose-finger and heel-to-shin bilaterally.  Gait and station: The patient has a slightly wide-based gait. He is able to walk independently. Tandem gait was not tested. Romberg is negative. No drift is seen.  Reflexes: Deep tendon reflexes are symmetric.   Assessment/Plan:  1. Alzheimer's disease  The patient is doing relatively well at this point. He is having some undulations in mental status, but overall, he has not had significant progression over the last year with his memory. We will continue Aricept and Namenda, he will follow-up in 6 months. Prescriptions were sent in for his medications.  Jill Alexanders MD 05/20/2015 8:39 PM  [facility] [address] [place], [place] [address]  Phone  [phone] Fax [phone]

## 2015-05-20 NOTE — Patient Instructions (Signed)
Alzheimer Disease Alzheimer disease is a mental disorder. It causes memory loss and loss of other mental functions, such as learning, thinking, problem solving, communicating, and completing tasks. The mental losses interfere with the ability to perform daily activities at work, at home, or in social situations. Alzheimer disease usually starts in a person's late 60s or early 70s but can start earlier in life (familial form). The mental changes caused by this disease are permanent and worsen over time. As the illness progresses, the ability to do even the simplest things is lost. Survival with Alzheimer disease ranges from several years to as long as 20 years. CAUSES Alzheimer disease is caused by abnormally high levels of a protein (beta-amyloid) in the brain. This protein forms very small deposits within and around the brain's nerve cells. These deposits prevent the nerve cells from working properly. Experts are not certain what causes the beta-amyloid deposits in this disease. RISK FACTORS The following major risk factors have been identified:  Increasing age.  Certain genetic variations, such as Down syndrome (trisomy 21). SYMPTOMS In the early stages of Alzheimer disease, you are still able to perform daily activities but need greater effort, more time, or memory aids. Early symptoms include:  Mild memory loss of recent events, names, or phone numbers.  Loss of objects.  Minor loss of vocabulary.  Difficulty with complex tasks, such as paying bills or driving in unfamiliar locations. Other mental functions deteriorate as the disease worsens. These changes slowly go from mild to severe. Symptoms at this stage include:  Difficulty remembering. You may not be able to recall personal information such as your address and telephone number. You may become confused about the date, the season of the year, or your location.  Difficulty maintaining attention. You may forget what you wanted to say  during conversations and repeat what you have already said.  Difficulty learning new information or tasks. You may not remember what you read or the name of a new friend you met.  Difficulty counting or doing math. You may have difficulty with complex math problems. You may make mistakes in paying bills or managing your checkbook.  Poor reasoning and judgment. You may make poor decisions or not dress right for the weather.  Difficulty communicating. You may have regular difficulty remembering words, naming objects, expressing yourself clearly, or writing sentences that make sense.  Difficulty performing familiar daily activities. You may get lost driving in familiar locations or need help eating, bathing, dressing, grooming, or using the toilet. You may have difficulty maintaining bladder or bowel control.  Difficulty recognizing familiar faces. You may confuse family members or close friends with one another. You may not recognize a close relative or may mistake strangers for family. Alzheimer disease also may cause changes in personality and behavior. These changes include:   Loss of interest or motivation.  Social withdrawal.  Anxiety.  Difficulty sleeping.  Uncharacteristic anger or combativeness.  A false belief that someone is trying to harm you (paranoia).  Seeing things that are not real (hallucinations).  Agitation. Confusion and disruptive behavior are often worse at night and may be triggered by changes in the environment or acute medical issues. DIAGNOSIS  Alzheimer disease is diagnosed through an assessment by your health care provider. During this assessment, your health care provider will do the following:  Ask you and your family, friends, or caregivers questions about your symptoms, their frequency, their duration and progression, and the effect they are having on your life.    Ask questions about your personal and family medical history and use of alcohol or drugs,  including prescription medicine.  Perform a physical exam and order blood tests and brain imaging exams. Your health care provider may refer you to a specialist for detailed evaluation of your mental functions (neuropsychological testing).  Many different brain disorders, medical conditions, and certain substances can cause symptoms that resemble Alzheimer disease symptoms. These must be ruled out before this disease can be diagnosed. If Alzheimer disease is diagnosed, it will be considered either "possible" or "probable" Alzheimer disease. "Possible" Alzheimer disease means that your symptoms are typical of the disease and no other disorder is causing them. "Probable" Alzheimer disease means that you also have a family history of the disease or genetic test results that support the diagnosis. Certain tests, mostly used in research studies, are highly specific for Alzheimer disease.  TREATMENT  There is currently no cure for this disease. The goals of treatment are to:  Slow down the progression of the disease.  Preserve mental function as long as possible.  Manage behavioral symptoms.  Make life easier for the person with Alzheimer disease and his or her caregivers. The following treatment options are available:  Medicine. Certain medicines may help slow memory loss by changing the level of certain chemicals in the brain. Medicine may also help with behavioral symptoms.  Talk therapy. Talk therapy provides education, support, and memory aids for people with this disease. It is most effective in the early stages of the illness.  Caregiving. Caregivers may be family members, friends, or trained medical professionals. They help the person with Alzheimer disease with daily life activities. Caregiving may take place at home or at a nursing facility.  Family support groups. These provide education, emotional support, and information about community resources to family members who are taking care of  the person with this disease. Document Released: 06/08/2004 Document Revised: 02/11/2014 Document Reviewed: 02/02/2013 [employer] Patient Information 2015 [employer]. This information is not intended to replace advice given to you by your health care provider. Make sure you discuss any questions you have with your health care provider.  

## 2015-06-03 ENCOUNTER — Ambulatory Visit (INDEPENDENT_AMBULATORY_CARE_PROVIDER_SITE_OTHER): Payer: Medicare Other | Admitting: *Deleted

## 2015-06-03 DIAGNOSIS — I441 Atrioventricular block, second degree: Secondary | ICD-10-CM

## 2015-06-03 NOTE — Progress Notes (Signed)
Remote pacemaker transmission.   

## 2015-06-13 LAB — CUP PACEART REMOTE DEVICE CHECK
Battery Remaining Longevity: 113 mo
Battery Remaining Percentage: 95.5 %
Battery Voltage: 3.01 V
Brady Statistic RA Percent Paced: 48 %
Brady Statistic RV Percent Paced: 61 %
Lead Channel Impedance Value: 530 Ohm
Lead Channel Pacing Threshold Amplitude: 0.75 V
Lead Channel Pacing Threshold Pulse Width: 0.5 ms
Lead Channel Pacing Threshold Pulse Width: 0.5 ms
Lead Channel Setting Pacing Amplitude: 2.5 V
Lead Channel Setting Sensing Sensitivity: 4 mV
MDC IDC MSMT LEADCHNL RA SENSING INTR AMPL: 1.5 mV
MDC IDC MSMT LEADCHNL RV IMPEDANCE VALUE: 480 Ohm
MDC IDC MSMT LEADCHNL RV PACING THRESHOLD AMPLITUDE: 0.75 V
MDC IDC MSMT LEADCHNL RV SENSING INTR AMPL: 12 mV
MDC IDC SESS DTM: 20160823064829
MDC IDC SET LEADCHNL RA PACING AMPLITUDE: 2 V
MDC IDC SET LEADCHNL RV PACING PULSEWIDTH: 0.5 ms
MDC IDC STAT BRADY AP VP PERCENT: 26 %
MDC IDC STAT BRADY AP VS PERCENT: 22 %
MDC IDC STAT BRADY AS VP PERCENT: 35 %
MDC IDC STAT BRADY AS VS PERCENT: 16 %
Pulse Gen Model: 2240
Pulse Gen Serial Number: 7602208

## 2015-06-25 DIAGNOSIS — M5136 Other intervertebral disc degeneration, lumbar region: Secondary | ICD-10-CM | POA: Diagnosis not present

## 2015-06-25 DIAGNOSIS — M6281 Muscle weakness (generalized): Secondary | ICD-10-CM | POA: Diagnosis not present

## 2015-06-25 DIAGNOSIS — M4696 Unspecified inflammatory spondylopathy, lumbar region: Secondary | ICD-10-CM | POA: Diagnosis not present

## 2015-06-26 ENCOUNTER — Other Ambulatory Visit: Payer: Self-pay | Admitting: Neurology

## 2015-06-27 ENCOUNTER — Encounter: Payer: Self-pay | Admitting: Cardiology

## 2015-07-04 ENCOUNTER — Encounter: Payer: Self-pay | Admitting: Cardiovascular Disease

## 2015-07-14 DIAGNOSIS — M5136 Other intervertebral disc degeneration, lumbar region: Secondary | ICD-10-CM | POA: Diagnosis not present

## 2015-07-14 DIAGNOSIS — M4696 Unspecified inflammatory spondylopathy, lumbar region: Secondary | ICD-10-CM | POA: Diagnosis not present

## 2015-07-18 DIAGNOSIS — R2681 Unsteadiness on feet: Secondary | ICD-10-CM | POA: Diagnosis not present

## 2015-07-18 DIAGNOSIS — M5136 Other intervertebral disc degeneration, lumbar region: Secondary | ICD-10-CM | POA: Diagnosis not present

## 2015-07-18 DIAGNOSIS — M4696 Unspecified inflammatory spondylopathy, lumbar region: Secondary | ICD-10-CM | POA: Diagnosis not present

## 2015-07-18 DIAGNOSIS — G40909 Epilepsy, unspecified, not intractable, without status epilepticus: Secondary | ICD-10-CM | POA: Diagnosis not present

## 2015-07-18 DIAGNOSIS — G309 Alzheimer's disease, unspecified: Secondary | ICD-10-CM | POA: Diagnosis not present

## 2015-07-18 DIAGNOSIS — F0280 Dementia in other diseases classified elsewhere without behavioral disturbance: Secondary | ICD-10-CM | POA: Diagnosis not present

## 2015-07-23 DIAGNOSIS — G309 Alzheimer's disease, unspecified: Secondary | ICD-10-CM | POA: Diagnosis not present

## 2015-07-23 DIAGNOSIS — M4696 Unspecified inflammatory spondylopathy, lumbar region: Secondary | ICD-10-CM | POA: Diagnosis not present

## 2015-07-23 DIAGNOSIS — F0280 Dementia in other diseases classified elsewhere without behavioral disturbance: Secondary | ICD-10-CM | POA: Diagnosis not present

## 2015-07-23 DIAGNOSIS — G40909 Epilepsy, unspecified, not intractable, without status epilepticus: Secondary | ICD-10-CM | POA: Diagnosis not present

## 2015-07-23 DIAGNOSIS — M5136 Other intervertebral disc degeneration, lumbar region: Secondary | ICD-10-CM | POA: Diagnosis not present

## 2015-07-23 DIAGNOSIS — R2681 Unsteadiness on feet: Secondary | ICD-10-CM | POA: Diagnosis not present

## 2015-07-28 DIAGNOSIS — G40909 Epilepsy, unspecified, not intractable, without status epilepticus: Secondary | ICD-10-CM | POA: Diagnosis not present

## 2015-07-28 DIAGNOSIS — M4696 Unspecified inflammatory spondylopathy, lumbar region: Secondary | ICD-10-CM | POA: Diagnosis not present

## 2015-07-28 DIAGNOSIS — G309 Alzheimer's disease, unspecified: Secondary | ICD-10-CM | POA: Diagnosis not present

## 2015-07-28 DIAGNOSIS — M5136 Other intervertebral disc degeneration, lumbar region: Secondary | ICD-10-CM | POA: Diagnosis not present

## 2015-07-28 DIAGNOSIS — F0280 Dementia in other diseases classified elsewhere without behavioral disturbance: Secondary | ICD-10-CM | POA: Diagnosis not present

## 2015-07-28 DIAGNOSIS — R2681 Unsteadiness on feet: Secondary | ICD-10-CM | POA: Diagnosis not present

## 2015-07-30 DIAGNOSIS — R2681 Unsteadiness on feet: Secondary | ICD-10-CM | POA: Diagnosis not present

## 2015-07-30 DIAGNOSIS — G309 Alzheimer's disease, unspecified: Secondary | ICD-10-CM | POA: Diagnosis not present

## 2015-07-30 DIAGNOSIS — M5136 Other intervertebral disc degeneration, lumbar region: Secondary | ICD-10-CM | POA: Diagnosis not present

## 2015-07-30 DIAGNOSIS — M4696 Unspecified inflammatory spondylopathy, lumbar region: Secondary | ICD-10-CM | POA: Diagnosis not present

## 2015-07-30 DIAGNOSIS — F0280 Dementia in other diseases classified elsewhere without behavioral disturbance: Secondary | ICD-10-CM | POA: Diagnosis not present

## 2015-07-30 DIAGNOSIS — G40909 Epilepsy, unspecified, not intractable, without status epilepticus: Secondary | ICD-10-CM | POA: Diagnosis not present

## 2015-08-01 DIAGNOSIS — F0280 Dementia in other diseases classified elsewhere without behavioral disturbance: Secondary | ICD-10-CM | POA: Diagnosis not present

## 2015-08-01 DIAGNOSIS — M4696 Unspecified inflammatory spondylopathy, lumbar region: Secondary | ICD-10-CM | POA: Diagnosis not present

## 2015-08-01 DIAGNOSIS — M5136 Other intervertebral disc degeneration, lumbar region: Secondary | ICD-10-CM | POA: Diagnosis not present

## 2015-08-01 DIAGNOSIS — G309 Alzheimer's disease, unspecified: Secondary | ICD-10-CM | POA: Diagnosis not present

## 2015-08-01 DIAGNOSIS — G40909 Epilepsy, unspecified, not intractable, without status epilepticus: Secondary | ICD-10-CM | POA: Diagnosis not present

## 2015-08-01 DIAGNOSIS — R2681 Unsteadiness on feet: Secondary | ICD-10-CM | POA: Diagnosis not present

## 2015-08-04 DIAGNOSIS — M4696 Unspecified inflammatory spondylopathy, lumbar region: Secondary | ICD-10-CM | POA: Diagnosis not present

## 2015-08-04 DIAGNOSIS — M5136 Other intervertebral disc degeneration, lumbar region: Secondary | ICD-10-CM | POA: Diagnosis not present

## 2015-08-04 DIAGNOSIS — G309 Alzheimer's disease, unspecified: Secondary | ICD-10-CM | POA: Diagnosis not present

## 2015-08-04 DIAGNOSIS — R2681 Unsteadiness on feet: Secondary | ICD-10-CM | POA: Diagnosis not present

## 2015-08-04 DIAGNOSIS — F0280 Dementia in other diseases classified elsewhere without behavioral disturbance: Secondary | ICD-10-CM | POA: Diagnosis not present

## 2015-08-04 DIAGNOSIS — G40909 Epilepsy, unspecified, not intractable, without status epilepticus: Secondary | ICD-10-CM | POA: Diagnosis not present

## 2015-08-06 ENCOUNTER — Other Ambulatory Visit: Payer: Self-pay | Admitting: Internal Medicine

## 2015-08-06 DIAGNOSIS — G309 Alzheimer's disease, unspecified: Secondary | ICD-10-CM | POA: Diagnosis not present

## 2015-08-06 DIAGNOSIS — R2681 Unsteadiness on feet: Secondary | ICD-10-CM | POA: Diagnosis not present

## 2015-08-06 DIAGNOSIS — M5136 Other intervertebral disc degeneration, lumbar region: Secondary | ICD-10-CM | POA: Diagnosis not present

## 2015-08-06 DIAGNOSIS — M4696 Unspecified inflammatory spondylopathy, lumbar region: Secondary | ICD-10-CM | POA: Diagnosis not present

## 2015-08-06 DIAGNOSIS — G40909 Epilepsy, unspecified, not intractable, without status epilepticus: Secondary | ICD-10-CM | POA: Diagnosis not present

## 2015-08-06 DIAGNOSIS — F0280 Dementia in other diseases classified elsewhere without behavioral disturbance: Secondary | ICD-10-CM | POA: Diagnosis not present

## 2015-08-08 DIAGNOSIS — M5136 Other intervertebral disc degeneration, lumbar region: Secondary | ICD-10-CM | POA: Diagnosis not present

## 2015-08-08 DIAGNOSIS — R2681 Unsteadiness on feet: Secondary | ICD-10-CM | POA: Diagnosis not present

## 2015-08-08 DIAGNOSIS — M4696 Unspecified inflammatory spondylopathy, lumbar region: Secondary | ICD-10-CM | POA: Diagnosis not present

## 2015-08-08 DIAGNOSIS — G40909 Epilepsy, unspecified, not intractable, without status epilepticus: Secondary | ICD-10-CM | POA: Diagnosis not present

## 2015-08-08 DIAGNOSIS — G309 Alzheimer's disease, unspecified: Secondary | ICD-10-CM | POA: Diagnosis not present

## 2015-08-08 DIAGNOSIS — F0280 Dementia in other diseases classified elsewhere without behavioral disturbance: Secondary | ICD-10-CM | POA: Diagnosis not present

## 2015-08-13 DIAGNOSIS — M6281 Muscle weakness (generalized): Secondary | ICD-10-CM | POA: Diagnosis not present

## 2015-08-13 DIAGNOSIS — M4686 Other specified inflammatory spondylopathies, lumbar region: Secondary | ICD-10-CM | POA: Diagnosis not present

## 2015-08-13 DIAGNOSIS — M5136 Other intervertebral disc degeneration, lumbar region: Secondary | ICD-10-CM | POA: Diagnosis not present

## 2015-08-19 ENCOUNTER — Ambulatory Visit (INDEPENDENT_AMBULATORY_CARE_PROVIDER_SITE_OTHER): Payer: Medicare Other | Admitting: Neurology

## 2015-08-19 ENCOUNTER — Encounter: Payer: Self-pay | Admitting: Neurology

## 2015-08-19 VITALS — BP 125/65 | HR 59 | Ht 67.0 in | Wt 161.0 lb

## 2015-08-19 DIAGNOSIS — F05 Delirium due to known physiological condition: Secondary | ICD-10-CM | POA: Diagnosis not present

## 2015-08-19 DIAGNOSIS — G301 Alzheimer's disease with late onset: Secondary | ICD-10-CM | POA: Diagnosis not present

## 2015-08-19 DIAGNOSIS — R410 Disorientation, unspecified: Secondary | ICD-10-CM

## 2015-08-19 DIAGNOSIS — F0280 Dementia in other diseases classified elsewhere without behavioral disturbance: Secondary | ICD-10-CM | POA: Diagnosis not present

## 2015-08-19 DIAGNOSIS — E538 Deficiency of other specified B group vitamins: Secondary | ICD-10-CM | POA: Diagnosis not present

## 2015-08-19 NOTE — Progress Notes (Signed)
Reason for visit: Alzheimer's disease  Daniel [name] is an 81 y.o. male  History of present illness:  Daniel [name] is a 81 year old right-handed white male with a history of a progressive dementing illness, and a history of anxiety. The patient is on Zoloft taking 100 mg daily. The patient has had some alteration in his mental status over the last one month, he has had more anxiety, and a decline in his cognitive functioning. The patient is not always recognizing what he is eating. He is not eating as much, but he is not losing weight. The patient has had wide variations in cognitive functioning levels previously. The patient has not had any change in his walking, no weakness, and no reports of numbness or headache. The patient comes to the office today for an evaluation. The patient lives at home, and his daughter is his caretaker. He has been getting his medications in properly.  Past Medical History  Diagnosis Date  . Memory loss   . OCD (obsessive compulsive disorder)   . Dysphagia     Mild  . Gastroesophageal reflux disease   . Degenerative arthritis   . Dyslipidemia   . Hearing loss   . Diverticulitis   . Left bundle branch block   . Nocturnal leg cramps   . Allergy   . Seizures ([place])   . Substance abuse   . Ulcer   . Cataract   . Anxiety   . Hypertension   . Hearing difficulty     bilateral hearing aids    Past Surgical History  Procedure Laterality Date  . Tonsillectomy    . Turp vaporization    . Cataract extraction Bilateral   . Basal cell carcinoma excision    . Prostate surgery    . Eye surgery    . Permanent pacemaker insertion N/A 01/03/2014    Procedure: PERMANENT PACEMAKER INSERTION;  Surgeon: Sanda Klein, MD;  Location: [facility];  Service: Cardiovascular;  Laterality: N/A;    Family History  Problem Relation Age of Onset  . Heart attack Mother   . Dementia Father   . Heart attack Sister   . Heart disease Sister   . Heart attack Brother   .  Heart attack Sister   . Heart attack Brother   . Stroke Brother   . Heart disease Daughter   . Heart disease Paternal Grandmother     Social history:  reports that he has quit smoking. He has never used smokeless tobacco. He reports that he does not drink alcohol or use illicit drugs.    Allergies  Allergen Reactions  . Sanctura [Trospium]     Seizures, body spasms  . Biaxin [Clarithromycin] Hives    Medications:  Prior to Admission medications   Medication Sig Start Date End Date Taking? Authorizing Provider  aspirin 81 MG tablet Take 81 mg by mouth daily.   Yes Historical Provider, MD  cholecalciferol (VITAMIN D) 1000 UNITS tablet Take 2,000 Units by mouth daily.    Yes Historical Provider, MD  donepezil (ARICEPT) 5 MG tablet TAKE 1 TABLET (5 MG TOTAL) BY MOUTH 2 (TWO) TIMES DAILY. 05/20/15  Yes Kathrynn Ducking, MD  fluticasone [facility]) 50 MCG/ACT nasal spray Place 2 sprays into both nostrils daily. 01/23/15  Yes Leandrew Koyanagi, MD  memantine (NAMENDA) 10 MG tablet TAKE 1 TABLET (10 MG TOTAL) BY MOUTH 2 (TWO) TIMES DAILY. 05/20/15  Yes Kathrynn Ducking, MD  mirabegron ER (MYRBETRIQ) 50 MG  TB24 tablet Take 50 mg by mouth daily.   Yes Historical Provider, MD  Multiple Vitamins-Minerals (PRESERVISION AREDS) TABS Take 1 tablet by mouth daily.   Yes Historical Provider, MD  olopatadine (PATANOL) 0.1 % ophthalmic solution Place 1 drop into both eyes daily. 07/24/15  Yes Historical Provider, MD  omeprazole (PRILOSEC) 20 MG capsule Take 1 capsule by mouth every morning on empty stomach. PATIENT NEEDS OFFICE VISIT FOR ADDITIONAL REFILLS 08/06/15  Yes Darlyne Russian, MD  Polyethyl Glycol-Propyl Glycol (SYSTANE OP) Place 1 drop into both eyes 2 (two) times daily.    Yes Historical Provider, MD  sertraline (ZOLOFT) 100 MG tablet TAKE 1 TABLET (100 MG TOTAL) BY MOUTH DAILY. 06/26/15  Yes Kathrynn Ducking, MD    ROS:  Out of a complete 14 system review of symptoms, the patient complains only of  the following symptoms, and all other reviewed systems are negative.  Decreased activity, decreased appetite Hearing loss, drooling Incontinence of the bladder Daytime sleepiness Walking difficulty Memory loss, speech difficulty Agitation, decreased concentration, anxiety  Blood pressure 125/65, pulse 59, height 5\' 7"  (1.702 m), weight 161 lb (73.029 kg).  Physical Exam  General: The patient is alert and cooperative at the time of the examination.  Skin: No significant peripheral edema is noted.   Neurologic Exam  Mental status: The patient is alert and oriented x 2 at the time of the examination (not oriented to date). The Mini-Mental Status Examination done today shows a total score of 13/30.   Cranial nerves: Facial symmetry is present. Speech is normal, no aphasia or dysarthria is noted. Extraocular movements are full. Visual fields are full.  Motor: The patient has good strength in all 4 extremities.  Sensory examination: Soft touch sensation is symmetric on the face, arms, and legs.  Coordination: The patient has good finger-nose-finger and heel-to-shin bilaterally.  Gait and station: The patient has a slightly wide-based gait. Tandem gait is slightly apraxic. Romberg is negative. No drift is seen.  Reflexes: Deep tendon reflexes are symmetric.   Assessment/Plan:  1. Alzheimer's disease  The patient had a decline in his cognitive function level, but the patient has done this previously. The patient will be set up for blood work and urinalysis to look for other causes of cognitive changes. The patient will remain on Aricept and Namenda, he will follow-up in 6 months. A referral was made for aid and assistance in the home environment.  Daniel Alexanders MD 08/19/2015 6:50 PM  [facility] [address] [place], [place] [address]  Phone [phone] Fax [phone]

## 2015-08-19 NOTE — Patient Instructions (Signed)
Alzheimer Disease °Alzheimer disease is a mental disorder. It causes memory loss and loss of other mental functions, such as learning, thinking, problem solving, communicating, and completing tasks. The mental losses interfere with the ability to perform daily activities at work, at home, or in social situations. °Alzheimer disease usually starts in a person's late 60s or early 70s but can start earlier in life (familial form). The mental changes caused by this disease are permanent and worsen over time. As the illness progresses, the ability to do even the simplest things is lost. Survival with Alzheimer disease ranges from several years to as long as 20 years. °CAUSES °Alzheimer disease is caused by abnormally high levels of a protein (beta-amyloid) in the brain. This protein forms very small deposits within and around the brain's nerve cells. These deposits prevent the nerve cells from working properly. Experts are not certain what causes the beta-amyloid deposits in this disease. °RISK FACTORS °The following major risk factors have been identified: °· Increasing age. °· Certain genetic variations, such as Down syndrome (trisomy 21). °SYMPTOMS °In the early stages of Alzheimer disease, you are still able to perform daily activities but need greater effort, more time, or memory aids. Early symptoms include: °· Mild memory loss of recent events, names, or phone numbers. °· Loss of objects. °· Minor loss of vocabulary. °· Difficulty with complex tasks, such as paying bills or driving in unfamiliar locations. °Other mental functions deteriorate as the disease worsens. These changes slowly go from mild to severe. Symptoms at this stage include: °· Difficulty remembering. You may not be able to recall personal information such as your address and telephone number. You may become confused about the date, the season of the year, or your location. °· Difficulty maintaining attention. You may forget what you wanted to say  during conversations and repeat what you have already said. °· Difficulty learning new information or tasks. You may not remember what you read or the name of a new friend you met. °· Difficulty counting or doing math. You may have difficulty with complex math problems. You may make mistakes in paying bills or managing your checkbook. °· Poor reasoning and judgment. You may make poor decisions or not dress right for the weather. °· Difficulty communicating. You may have regular difficulty remembering words, naming objects, expressing yourself clearly, or writing sentences that make sense. °· Difficulty performing familiar daily activities. You may get lost driving in familiar locations or need help eating, bathing, dressing, grooming, or using the toilet. You may have difficulty maintaining bladder or bowel control. °· Difficulty recognizing familiar faces. You may confuse family members or close friends with one another. You may not recognize a close relative or may mistake strangers for family. °Alzheimer disease also may cause changes in personality and behavior. These changes include:  °· Loss of interest or motivation. °· Social withdrawal. °· Anxiety. °· Difficulty sleeping. °· Uncharacteristic anger or combativeness. °· A false belief that someone is trying to harm you (paranoia). °· Seeing things that are not real (hallucinations). °· Agitation. °Confusion and disruptive behavior are often worse at night and may be triggered by changes in the environment or acute medical issues. °DIAGNOSIS  °Alzheimer disease is diagnosed through an assessment by your health care provider. During this assessment, your health care provider will do the following: °· Ask you and your family, friends, or caregivers questions about your symptoms, their frequency, their duration and progression, and the effect they are having on your life. °·   Ask questions about your personal and family medical history and use of alcohol or drugs,  including prescription medicine.  Perform a physical exam and order blood tests and brain imaging exams. Your health care provider may refer you to a specialist for detailed evaluation of your mental functions (neuropsychological testing).  Many different brain disorders, medical conditions, and certain substances can cause symptoms that resemble Alzheimer disease symptoms. These must be ruled out before this disease can be diagnosed. If Alzheimer disease is diagnosed, it will be considered either "possible" or "probable" Alzheimer disease. "Possible" Alzheimer disease means that your symptoms are typical of the disease and no other disorder is causing them. "Probable" Alzheimer disease means that you also have a family history of the disease or genetic test results that support the diagnosis. Certain tests, mostly used in research studies, are highly specific for Alzheimer disease.  TREATMENT  There is currently no cure for this disease. The goals of treatment are to:  Slow down the progression of the disease.  Preserve mental function as long as possible.  Manage behavioral symptoms.  Make life easier for the person with Alzheimer disease and his or her caregivers. The following treatment options are available:  Medicine. Certain medicines may help slow memory loss by changing the level of certain chemicals in the brain. Medicine may also help with behavioral symptoms.  Talk therapy. Talk therapy provides education, support, and memory aids for people with this disease. It is most effective in the early stages of the illness.  Caregiving. Caregivers may be family members, friends, or trained medical professionals. They help the person with Alzheimer disease with daily life activities. Caregiving may take place at home or at a nursing facility.  Family support groups. These provide education, emotional support, and information about community resources to family members who are taking care of  the person with this disease.   This information is not intended to replace advice given to you by your health care provider. Make sure you discuss any questions you have with your health care provider.   Document Released: 06/08/2004 Document Revised: 10/18/2014 Document Reviewed: 02/02/2013 Elsevier Interactive Patient Education [employer].

## 2015-08-20 LAB — CBC WITH DIFFERENTIAL/PLATELET
Basophils Absolute: 0.1 10*3/uL (ref 0.0–0.2)
Basos: 1 %
EOS (ABSOLUTE): 0.2 10*3/uL (ref 0.0–0.4)
EOS: 3 %
HEMATOCRIT: 39.7 % (ref 37.5–51.0)
HEMOGLOBIN: 13.3 g/dL (ref 12.6–17.7)
IMMATURE GRANS (ABS): 0 10*3/uL (ref 0.0–0.1)
IMMATURE GRANULOCYTES: 0 %
LYMPHS: 33 %
Lymphocytes Absolute: 2.4 10*3/uL (ref 0.7–3.1)
MCH: 32.3 pg (ref 26.6–33.0)
MCHC: 33.5 g/dL (ref 31.5–35.7)
MCV: 96 fL (ref 79–97)
MONOCYTES: 12 %
Monocytes Absolute: 0.8 10*3/uL (ref 0.1–0.9)
Neutrophils Absolute: 3.6 10*3/uL (ref 1.4–7.0)
Neutrophils: 51 %
Platelets: 208 10*3/uL (ref 150–379)
RBC: 4.12 x10E6/uL — AB (ref 4.14–5.80)
RDW: 14.2 % (ref 12.3–15.4)
WBC: 7.1 10*3/uL (ref 3.4–10.8)

## 2015-08-20 LAB — COMPREHENSIVE METABOLIC PANEL
ALBUMIN: 4.2 g/dL (ref 3.2–4.6)
ALT: 23 IU/L (ref 0–44)
AST: 28 IU/L (ref 0–40)
Albumin/Globulin Ratio: 1.8 (ref 1.1–2.5)
Alkaline Phosphatase: 76 IU/L (ref 39–117)
BUN / CREAT RATIO: 14 (ref 10–22)
BUN: 16 mg/dL (ref 10–36)
Bilirubin Total: 0.4 mg/dL (ref 0.0–1.2)
CO2: 25 mmol/L (ref 18–29)
CREATININE: 1.18 mg/dL (ref 0.76–1.27)
Calcium: 9 mg/dL (ref 8.6–10.2)
Chloride: 100 mmol/L (ref 97–106)
GFR calc Af Amer: 62 mL/min/{1.73_m2} (ref >59)
GFR calc non Af Amer: 53 mL/min/{1.73_m2} — ABNORMAL LOW (ref >59)
Globulin, Total: 2.3 g/dL (ref 1.5–4.5)
Glucose: 88 mg/dL (ref 65–99)
Potassium: 4.5 mmol/L (ref 3.5–5.2)
Sodium: 141 mmol/L (ref 136–144)
Total Protein: 6.5 g/dL (ref 6.0–8.5)

## 2015-08-20 LAB — URINALYSIS, ROUTINE W REFLEX MICROSCOPIC
BILIRUBIN UA: NEGATIVE
Glucose, UA: NEGATIVE
Ketones, UA: NEGATIVE
Leukocytes, UA: NEGATIVE
NITRITE UA: NEGATIVE
PH UA: 5.5 (ref 5.0–7.5)
Protein, UA: NEGATIVE
RBC UA: NEGATIVE
Specific Gravity, UA: 1.018 (ref 1.005–1.030)
UUROB: 0.2 mg/dL (ref 0.2–1.0)

## 2015-08-20 LAB — VITAMIN B12: Vitamin B-12: 585 pg/mL (ref 211–946)

## 2015-09-03 ENCOUNTER — Ambulatory Visit (INDEPENDENT_AMBULATORY_CARE_PROVIDER_SITE_OTHER): Payer: Medicare Other | Admitting: Internal Medicine

## 2015-09-03 ENCOUNTER — Encounter: Payer: Self-pay | Admitting: Internal Medicine

## 2015-09-03 VITALS — BP 106/52 | HR 59 | Temp 98.6°F | Resp 16 | Ht 68.0 in | Wt 162.4 lb

## 2015-09-03 DIAGNOSIS — G301 Alzheimer's disease with late onset: Secondary | ICD-10-CM | POA: Diagnosis not present

## 2015-09-03 DIAGNOSIS — Z0000 Encounter for general adult medical examination without abnormal findings: Secondary | ICD-10-CM

## 2015-09-03 DIAGNOSIS — Z23 Encounter for immunization: Secondary | ICD-10-CM

## 2015-09-03 DIAGNOSIS — F411 Generalized anxiety disorder: Secondary | ICD-10-CM

## 2015-09-03 DIAGNOSIS — I10 Essential (primary) hypertension: Secondary | ICD-10-CM

## 2015-09-03 DIAGNOSIS — F0280 Dementia in other diseases classified elsewhere without behavioral disturbance: Secondary | ICD-10-CM

## 2015-09-03 MED ORDER — OMEPRAZOLE 20 MG PO CPDR: DELAYED_RELEASE_CAPSULE | ORAL | Status: DC

## 2015-09-03 MED ORDER — SERTRALINE HCL 100 MG PO TABS: 150.0000 mg | ORAL_TABLET | Freq: Every day | ORAL | Status: DC

## 2015-09-03 NOTE — Progress Notes (Signed)
Subjective:    Patient ID: Daniel [name], male    DOB: 1921/05/15, 81 y.o.   MRN: [id number]  HPIannual medicare subsq Patient Active Problem List   Diagnosis Date Noted  . Cardiac pacemaker implanted- [employer] - 01/03/14 01/04/2014  . Asthma 01/02/2014  . Heart block 01/02/2014  . Heart block AV second degree 01/02/2014  . BPH (benign prostatic hyperplasia) 01/24/2013  . GERD (gastroesophageal reflux disease) 01/24/2013  . HTN (hypertension) 01/24/2013  . Other and unspecified hyperlipidemia 01/24/2013  . Diverticular disease of colon 01/24/2013  . Alzheimer's disease 01/03/2013   He has continued to worsen in terms of his dementia: Cleans dentures Does dishwasher well Color coord clothes has learned to do 336 before calls Urinary urgency cause forgets to go til he stands up Memory much worse--hard to recog daughters and son--can't remember wife so well--sees daughter as wife Can still read a book but not discuss it Recycles trash correctly  See last Dr Viona Gilmore note 11/6--worse Stress on fam is tough  Sleeps [id number]-no wandering Handles bedtime routine himself House is childproofed [place] Daughter describes marked anxiety when they are about to go anywhere that often interferes with even sitting to have lunch in a restaurant as he is completely anxious about whether the right food will come He has been on sertraline 100 mg after starting this medication in 2014 for anxiety with some depression.  Review of Systems  Constitutional: Positive for activity change.  HENT: Positive for drooling, hearing loss, rhinorrhea and sneezing.   Eyes: Negative.   Respiratory: Negative.   Cardiovascular: Negative.   Gastrointestinal: Negative.   Endocrine: Positive for polyuria.  Genitourinary: Positive for urgency and frequency.  Musculoskeletal: Positive for myalgias, back pain, joint swelling, arthralgias and gait problem.  Skin: Negative.   Allergic/Immunologic: Positive for  environmental allergies.  Neurological: Positive for speech difficulty and weakness.  Hematological: Bruises/bleeds easily.  Psychiatric/Behavioral: Positive for confusion, decreased concentration and agitation. The patient is nervous/anxious.   ocd     Objective:   Physical Exam  Constitutional: He is oriented to person, place, and time. He appears well-developed and well-nourished.  HENT:  Head: Normocephalic and atraumatic.  Right Ear: Hearing, tympanic membrane, external ear and ear canal normal.  Left Ear: Hearing, tympanic membrane, external ear and ear canal normal.  Nose: Nose normal.  Mouth/Throat: Uvula is midline, oropharynx is clear and moist and mucous membranes are normal.  Eyes: Conjunctivae, EOM and lids are normal. Pupils are equal, round, and reactive to light. Right eye exhibits no discharge. Left eye exhibits no discharge. No scleral icterus.  Neck: Trachea normal and normal range of motion. Neck supple. Carotid bruit is not present.  Cardiovascular: Normal rate, regular rhythm, normal heart sounds, intact distal pulses and normal pulses.   No murmur heard. Pulmonary/Chest: Effort normal and breath sounds normal. No respiratory distress. He has no wheezes. He has no rhonchi. He has no rales.  Abdominal: Soft. Normal appearance and bowel sounds are normal. He exhibits no abdominal bruit. There is no tenderness.  Musculoskeletal: Normal range of motion. He exhibits no edema or tenderness.  Lymphadenopathy:       Head (right side): No submental, no submandibular, no tonsillar, no preauricular, no posterior auricular and no occipital adenopathy present.       Head (left side): No submental, no submandibular, no tonsillar, no preauricular, no posterior auricular and no occipital adenopathy present.    He has no cervical adenopathy.  Neurological: He is alert and  oriented to person, place, and time. He has normal strength and normal reflexes. No cranial nerve deficit or  sensory deficit. Coordination and gait normal.  Skin: Skin is warm, dry and intact. No lesion and no rash noted.  Bruising on right shin from recent accident but no bony defect or infection  Psychiatric: His speech is normal.  Very passive without any signs of involvement in our conversation and with no signs of anxiety. His daughter does all the talking although he will respond to my questions with an answer that may or may not be correct.  BP 106/52 mmHg  Pulse 59  Temp(Src) 98.6 F (37 C) (Oral)  Resp 16  Ht 5\' 8"  (1.727 m)  Wt 162 lb 6.4 oz (73.664 kg)  BMI 24.70 kg/m2  SpO2 97%     Assessment & Plan:  Medicare annual wellness visit, subsequent  Essential hypertension  Late onset Alzheimer's disease without behavioral disturbance  GAD (generalized anxiety disorder)  --Increase Zoloft to 150 mg   P-23 Home asst via neuro(?[facility]) encouraged as the caregivers are reaching their limits  Meds ordered this encounter  Medications  . omeprazole (PRILOSEC) 20 MG capsule    Sig: One every other day for 2 weeks then one twice a week for 2 weeks, then d/c    Dispense:  30 capsule    Refill:  0  . sertraline (ZOLOFT) 100 MG tablet    Sig: Take 1.5 tablets (150 mg total) by mouth daily.    Dispense:  135 tablet    Refill:  3   He has his other medications We will stop his omeprazole to see if any reflux symptoms return in an effort to get him off as many medications as possible

## 2015-09-04 DIAGNOSIS — F411 Generalized anxiety disorder: Secondary | ICD-10-CM | POA: Insufficient documentation

## 2015-09-05 ENCOUNTER — Other Ambulatory Visit: Payer: Self-pay | Admitting: Internal Medicine

## 2015-09-08 ENCOUNTER — Encounter: Payer: Self-pay | Admitting: Internal Medicine

## 2015-09-08 ENCOUNTER — Ambulatory Visit (INDEPENDENT_AMBULATORY_CARE_PROVIDER_SITE_OTHER): Payer: Medicare Other | Admitting: *Deleted

## 2015-09-08 DIAGNOSIS — I441 Atrioventricular block, second degree: Secondary | ICD-10-CM | POA: Diagnosis not present

## 2015-09-08 NOTE — Progress Notes (Signed)
Remote pacemaker transmission.   

## 2015-09-14 ENCOUNTER — Other Ambulatory Visit: Payer: Self-pay | Admitting: Internal Medicine

## 2015-09-16 ENCOUNTER — Encounter: Payer: Self-pay | Admitting: Cardiology

## 2015-09-16 LAB — CUP PACEART REMOTE DEVICE CHECK
Battery Remaining Longevity: 109 mo
Battery Voltage: 2.99 V
Brady Statistic AP VS Percent: 13 %
Brady Statistic AS VS Percent: 9.4 %
Date Time Interrogation Session: 20161125090011
Implantable Lead Location: 753859
Implantable Lead Location: 753860
Lead Channel Impedance Value: 480 Ohm
Lead Channel Impedance Value: 490 Ohm
Lead Channel Pacing Threshold Amplitude: 0.75 V
Lead Channel Pacing Threshold Pulse Width: 0.5 ms
Lead Channel Pacing Threshold Pulse Width: 0.5 ms
Lead Channel Setting Pacing Amplitude: 2 V
Lead Channel Setting Pacing Amplitude: 2.5 V
MDC IDC LEAD IMPLANT DT: 20150326
MDC IDC LEAD IMPLANT DT: 20150326
MDC IDC MSMT BATTERY REMAINING PERCENTAGE: 95.5 %
MDC IDC MSMT LEADCHNL RA PACING THRESHOLD AMPLITUDE: 0.75 V
MDC IDC MSMT LEADCHNL RA SENSING INTR AMPL: 2.5 mV
MDC IDC MSMT LEADCHNL RV SENSING INTR AMPL: 12 mV
MDC IDC SET LEADCHNL RV PACING PULSEWIDTH: 0.5 ms
MDC IDC SET LEADCHNL RV SENSING SENSITIVITY: 4 mV
MDC IDC STAT BRADY AP VP PERCENT: 36 %
MDC IDC STAT BRADY AS VP PERCENT: 42 %
MDC IDC STAT BRADY RA PERCENT PACED: 48 %
MDC IDC STAT BRADY RV PERCENT PACED: 78 %
Pulse Gen Model: 2240
Pulse Gen Serial Number: 7602208

## 2015-09-16 NOTE — Telephone Encounter (Signed)
OK to give RFs?

## 2015-09-30 ENCOUNTER — Encounter: Payer: Self-pay | Admitting: Internal Medicine

## 2015-10-03 MED ORDER — OMEPRAZOLE 40 MG PO CPDR: 40.0000 mg | DELAYED_RELEASE_CAPSULE | Freq: Every day | ORAL | Status: DC

## 2015-10-03 NOTE — Telephone Encounter (Signed)
Meds ordered this encounter  Medications  . omeprazole (PRILOSEC) 40 MG capsule    Sig: Take 1 capsule (40 mg total) by mouth daily. One every other day for 2 weeks then one twice a week for 2 weeks, then d/c    Dispense:  90 capsule    Refill:  3

## 2015-10-07 DIAGNOSIS — H353112 Nonexudative age-related macular degeneration, right eye, intermediate dry stage: Secondary | ICD-10-CM | POA: Diagnosis not present

## 2015-10-07 DIAGNOSIS — H353123 Nonexudative age-related macular degeneration, left eye, advanced atrophic without subfoveal involvement: Secondary | ICD-10-CM | POA: Diagnosis not present

## 2015-10-08 ENCOUNTER — Telehealth: Payer: Self-pay

## 2015-10-08 MED ORDER — OMEPRAZOLE 20 MG PO CPDR: 20.0000 mg | DELAYED_RELEASE_CAPSULE | Freq: Every day | ORAL | Status: AC

## 2015-10-08 NOTE — Telephone Encounter (Signed)
Pharm faxed note stating that pt's daughter does not wish to increase/change pt's dose of omeprazole, she just wants to continue the 20 mg QD he was on previously. See notes under last pt email enc, Dr Laney Pastor sent in new Rx, but sent it in for 40 mg in error. Records support that pt was taking the 20 mg, so I will send the corrected Rx in now.

## 2015-10-16 ENCOUNTER — Ambulatory Visit (INDEPENDENT_AMBULATORY_CARE_PROVIDER_SITE_OTHER): Payer: Medicare Other

## 2015-10-16 ENCOUNTER — Ambulatory Visit (INDEPENDENT_AMBULATORY_CARE_PROVIDER_SITE_OTHER): Payer: Medicare Other | Admitting: Family Medicine

## 2015-10-16 VITALS — BP 122/72 | HR 65 | Temp 98.8°F | Resp 17 | Ht 67.5 in | Wt 161.0 lb

## 2015-10-16 DIAGNOSIS — F0280 Dementia in other diseases classified elsewhere without behavioral disturbance: Secondary | ICD-10-CM

## 2015-10-16 DIAGNOSIS — M25571 Pain in right ankle and joints of right foot: Secondary | ICD-10-CM

## 2015-10-16 DIAGNOSIS — G308 Other Alzheimer's disease: Principal | ICD-10-CM

## 2015-10-16 DIAGNOSIS — L97511 Non-pressure chronic ulcer of other part of right foot limited to breakdown of skin: Secondary | ICD-10-CM

## 2015-10-16 MED ORDER — MUPIROCIN 2 % EX OINT: TOPICAL_OINTMENT | CUTANEOUS | Status: DC

## 2015-10-16 NOTE — Patient Instructions (Signed)
Skin Ulcer  A skin ulcer is an open sore that can be shallow or deep. Skin ulcers sometimes become infected and are difficult to treat. It may be 1 month or longer before real healing progress is made.  CAUSES   · Injury.  · Problems with the veins or arteries.  · Diabetes.  · Insect bites.  · Bedsores.  · Inflammatory conditions.  SYMPTOMS   · Pain, redness, swelling, and tenderness around the ulcer.  · Fever.  · Bleeding from the ulcer.  · Yellow or clear fluid coming from the ulcer.  DIAGNOSIS   There are many types of skin ulcers. Any open sores will be examined. Certain tests will be done to determine the kind of ulcer you have. The right treatment depends on the type of ulcer you have.  TREATMENT   Treatment is a long-term challenge. It may include:  · Wearing an elastic wrap, compression stockings, or gel cast over the ulcer area.  · Taking antibiotic medicines or putting antibiotic creams on the affected area if there is an infection.  HOME CARE INSTRUCTIONS  · Put on your bandages (dressings), wraps, or casts over the ulcer as directed by your caregiver.  · Change all dressings as directed by your caregiver.  · Take all medicines as directed by your caregiver.  · Keep the affected area clean and dry.  · Avoid injuries to the affected area.  · Eat a well-balanced, healthy diet that includes plenty of fruit and vegetables.  · If you smoke, consider quitting or decreasing the amount of cigarettes you smoke.  · Once the ulcer heals, get regular exercise as directed by your caregiver.  · Work with your caregiver to make sure your blood pressure, cholesterol, and diabetes are well-controlled.  · Keep your skin moisturized. Dry skin can crack and lead to skin ulcers.  SEEK IMMEDIATE MEDICAL CARE IF:   · Your pain gets worse.  · You have swelling, redness, or fluids around the ulcer.  · You have chills.  · You have a fever.  MAKE SURE YOU:   · Understand these instructions.  · Will watch your condition.  · Will get  help right away if you are not doing well or get worse.     This information is not intended to replace advice given to you by your health care provider. Make sure you discuss any questions you have with your health care provider.     Document Released: 11/04/2004 Document Revised: 12/20/2011 Document Reviewed: 05/14/2011  Elsevier Interactive Patient Education ©2016 Elsevier Inc.

## 2015-10-16 NOTE — Progress Notes (Signed)
 Chief Complaint:  Chief Complaint  Patient presents with  . Foot Injury    right     HPI: Daniel [name] is a 82 y.o. male who reports to [facility] today complaining of right foot pain on lateral side, unknown time frame, he has dementia. He has a scab over it, daughter is worried about it being infected. Has a tendedncy to hit himslef against objects. He wears shoes that are too small for him, many of his shoes are 80 years old. He complains of tenderness in the area and likes to pick at it.   Past Medical History  Diagnosis Date  . Memory loss   . OCD (obsessive compulsive disorder)   . Dysphagia     Mild  . Gastroesophageal reflux disease   . Degenerative arthritis   . Dyslipidemia   . Hearing loss   . Diverticulitis   . Left bundle branch block   . Nocturnal leg cramps   . Allergy   . Seizures (HCC)   . Substance abuse   . Ulcer   . Cataract   . Anxiety   . Hypertension   . Hearing difficulty     bilateral hearing aids   Past Surgical History  Procedure Laterality Date  . Tonsillectomy    . Turp vaporization    . Cataract extraction Bilateral   . Basal cell carcinoma excision    . Prostate surgery    . Eye surgery    . Permanent pacemaker insertion N/A 01/03/2014    Procedure: PERMANENT PACEMAKER INSERTION;  Surgeon: Luana Rumple, MD;  Location: [facility] CATH LAB;  Service: Cardiovascular;  Laterality: N/A;   Social History   Social History  . Marital Status: Widowed    Spouse Name: N/A  . Number of Children: N/A  . Years of Education: N/A   Occupational History  . Retired    Social History Main Topics  . Smoking status: Former Games developer  . Smokeless tobacco: Never Used  . Alcohol Use: No     Comment: History of alcoholism, no longer drinking  . Drug Use: No  . Sexual Activity: No   Other Topics Concern  . None   Social History Narrative   Widow. Education: [employer].            Patient is right handed.   Family History  Problem Relation Age  of Onset  . Heart attack Mother   . Dementia Father   . Heart attack Sister   . Heart disease Sister   . Heart attack Brother   . Heart attack Sister   . Heart attack Brother   . Stroke Brother   . Heart disease Daughter   . Heart disease Paternal Grandmother    Allergies  Allergen Reactions  . Sanctura [Trospium]     Seizures, body spasms  . Biaxin [Clarithromycin] Hives   Prior to Admission medications   Medication Sig Start Date End Date Taking? Authorizing Provider  aspirin  81 MG tablet Take 81 mg by mouth daily.   Yes Historical Provider, MD  cholecalciferol  (VITAMIN D ) 1000 UNITS tablet Take 2,000 Units by mouth daily.    Yes Historical Provider, MD  donepezil  (ARICEPT ) 5 MG tablet TAKE 1 TABLET (5 MG TOTAL) BY MOUTH 2 (TWO) TIMES DAILY. 05/20/15  Yes Brian Campanile, MD  fluticasone  (FLONASE ) 50 MCG/ACT nasal spray Place 2 sprays into both nostrils daily. 01/23/15  Yes Johnnie Nan, MD  memantine  (NAMENDA ) 10 MG tablet  TAKE 1 TABLET (10 MG TOTAL) BY MOUTH 2 (TWO) TIMES DAILY. 05/20/15  Yes Brian Campanile, MD  mirabegron  ER (MYRBETRIQ ) 50 MG TB24 tablet Take 50 mg by mouth daily.   Yes Historical Provider, MD  Multiple Vitamins-Minerals (PRESERVISION AREDS) TABS Take 1 tablet by mouth daily.   Yes Historical Provider, MD  olopatadine  (PATANOL) 0.1 % ophthalmic solution Place 1 drop into both eyes daily. 07/24/15  Yes Historical Provider, MD  olopatadine  (PATANOL) 0.1 % ophthalmic solution PLACE 1 DROP INTO BOTH EYES 2 (TWO) TIMES DAILY. 09/17/15  Yes Johnnie Nan, MD  omeprazole  (PRILOSEC) 20 MG capsule Take 1 capsule (20 mg total) by mouth daily. Take in morning on empty stomach. 10/08/15  Yes Johnnie Nan, MD  Polyethyl Glycol-Propyl Glycol (SYSTANE OP) Place 1 drop into both eyes 2 (two) times daily.    Yes Historical Provider, MD  sertraline  (ZOLOFT ) 100 MG tablet Take 1.5 tablets (150 mg total) by mouth daily. 09/03/15  Yes Johnnie Nan, MD     ROS:  The patient denies fevers, chills, night sweats, unintentional weight loss, chest pain, palpitations, wheezing, dyspnea on exertion, nausea, vomiting, abdominal pain, dysuria, hematuria, melena, numbness, weakness, or tingling.   All other systems have been reviewed and were otherwise negative with the exception of those mentioned in the HPI and as above.    PHYSICAL EXAM: Filed Vitals:   10/16/15 1355  BP: 122/72  Pulse: 65  Temp: 98.8 F (37.1 C)  Resp: 17   Body mass index is 24.83 kg/(m^2).   General: Alert, no acute distress HEENT:  Normocephalic, atraumatic, oropharynx patent. EOMI, PERRLA Cardiovascular:  Regular rate and rhythm, no rubs + murmurs or gallops.   Respiratory: Clear to auscultation bilaterally.  No wheezes, rales, or rhonchi.  No cyanosis, no use of accessory musculature Abdominal: No organomegaly, abdomen is soft and non-tender, positive bowel sounds. No masses. Skin: + small non infected pressure ulcer with well healing scab, + DP  Neurologic: Facial musculature symmetric. Psychiatric: Patient acts appropriately throughout our interaction. Lymphatic: No cervical or submandibular lymphadenopathy Musculoskeletal: Gait intact.    LABS: Results for orders placed or performed in visit on 09/08/15  Implantable device - remote  Result Value Ref Range   Date Time Interrogation Session [phone]    Pulse Generator Manufacturer [employer]    Pulse Gen Model [address] DR    Pulse Gen Serial Number [id number]    Implantable Pulse Generator Type Implantable Pulse Generator    Implantable Pulse Generator Implant Date [id number]    Implantable Lead Manufacturer [employer]    Implantable Lead Model Jude [id number]    Implantable Lead Serial Number [id number]    Implantable Lead Implant Date [id number]    Implantable Lead Location [id number]    Implantable Lead Manufacturer [facility]    Implantable Lead Model Jude [id number]    Implantable Lead Serial Number [id number]     Implantable Lead Implant Date [id number]    Implantable Lead Location [id number]    Lead Channel Setting Sensing Sensitivity 4.0 mV   Lead Channel Setting Sensing Adaptation Mode Fixed Pacing    Lead Channel Setting Pacing Amplitude 2.0 V   Lead Channel Setting Pacing Pulse Width 0.5 ms   Lead Channel Setting Pacing Amplitude 2.5 V   Lead Channel Status NULL    Lead Channel Impedance Value 490 ohm   Lead Channel Sensing Intrinsic Amplitude 2.5 mV   Lead Channel Pacing Threshold Amplitude 0.75 V   Lead Channel Pacing Threshold  Pulse Width 0.5 ms   Lead Channel Status NULL    Lead Channel Impedance Value 480 ohm   Lead Channel Sensing Intrinsic Amplitude 12.0 mV   Lead Channel Pacing Threshold Amplitude 0.75 V   Lead Channel Pacing Threshold Pulse Width 0.5 ms   Battery Status MOS    Battery Remaining Longevity 109 mo   Battery Remaining Percentage 95.5 %   Battery Voltage 2.99 V   Brady Statistic RA Percent Paced 48.0 %   Brady Statistic RV Percent Paced 78.0 %   Brady Statistic AP VP Percent 36.0 %   Brady Statistic AS VP Percent 42.0 %   Brady Statistic AP VS Percent 13.0 %   Brady Statistic AS VS Percent 9.4 %   Eval Rhythm ApVp      EKG/XRAY:   Primary read interpreted by Dr. Buck Carbon at [facility]. Neg for fracture or dislocation   ASSESSMENT/PLAN: Encounter Diagnoses  Name Primary?  . Alzheimer's disease of other onset without behavioral disturbance Yes  . Foot ulcer, right, limited to breakdown of skin (HCC)   . Pain in joint, ankle and foot, right    Xray negative Clean with soap and water Bactroban  and nonadhesive dry dressing daily He has a pressure ulcer that has scabbed over without any evidence of cellulitis or gross infection or eschar or draiange.  Will use bactroban  as ppx prevention  Change shoes to larger shoe size Fu prn   Gross sideeffects, risk and benefits, and alternatives of medications d/w patient. Patient is aware that all medications have potential  sideeffects and we are unable to predict every sideeffect or drug-drug interaction that may occur.  Faige Seely DO  10/16/2015 4:05 PM

## 2015-10-22 ENCOUNTER — Ambulatory Visit: Payer: Medicare Other | Admitting: Podiatry

## 2015-10-28 DIAGNOSIS — D1801 Hemangioma of skin and subcutaneous tissue: Secondary | ICD-10-CM | POA: Diagnosis not present

## 2015-10-28 DIAGNOSIS — Z85828 Personal history of other malignant neoplasm of skin: Secondary | ICD-10-CM | POA: Diagnosis not present

## 2015-10-28 DIAGNOSIS — L821 Other seborrheic keratosis: Secondary | ICD-10-CM | POA: Diagnosis not present

## 2015-10-28 DIAGNOSIS — L738 Other specified follicular disorders: Secondary | ICD-10-CM | POA: Diagnosis not present

## 2015-10-28 DIAGNOSIS — L570 Actinic keratosis: Secondary | ICD-10-CM | POA: Diagnosis not present

## 2015-10-29 ENCOUNTER — Ambulatory Visit (INDEPENDENT_AMBULATORY_CARE_PROVIDER_SITE_OTHER): Payer: Medicare Other | Admitting: Podiatry

## 2015-10-29 VITALS — BP 132/67 | HR 60 | Resp 14

## 2015-10-29 DIAGNOSIS — M79673 Pain in unspecified foot: Secondary | ICD-10-CM | POA: Diagnosis not present

## 2015-10-29 DIAGNOSIS — B351 Tinea unguium: Secondary | ICD-10-CM | POA: Diagnosis not present

## 2015-10-29 DIAGNOSIS — M79609 Pain in unspecified limb: Principal | ICD-10-CM

## 2015-10-29 NOTE — Progress Notes (Signed)
Subjective:     Patient ID: Daniel [name], male   DOB: 12-24-1920, 82 y.o.   MRN: [id number]  HPI this patient presents to the office with chief complaint of long thick nails. He also has a history of the skin lesion on the outside of the right foot. He was seen at the emergency care where an x-ray was taken and he was given topical antibiotic. He has been applying the medicine and a bandage. since that visit.  No evidence of redness, swelling or infection noted on the outside of the right foot. Patient has thick disfigured discolored toenails which are painful walking and wearing his shoes. He presents for preventative foot care services and  evaluation of his skin lesion.   Review of Systems     Objective:   Physical Exam GENERAL APPEARANCE: Alert, conversant. Appropriately groomed. No acute distress.  VASCULAR: Pedal pulses palpable at  [facility] and PT bilateral.  Capillary refill time is immediate to all digits,  Normal temperature gradient.  Digital hair growth is present bilateral  NEUROLOGIC: sensation is normal to 5.07 monofilament at 5/5 sites bilateral.  Light touch is intact bilateral, Muscle strength normal.  MUSCULOSKELETAL: acceptable muscle strength, tone and stability bilateral.  Intrinsic muscluature intact bilateral.  Rectus appearance of foot and digits noted bilateral.   DERMATOLOGIC: skin color, texture, and turgor are within normal limits.  No preulcerative lesions or ulcers  are seen, no interdigital maceration noted.  No open lesions present.  . No drainage noted.Healing skin lesion with no evidence of infection or drainage.  NAILS  Thick disfigured discolored nails all toes both feet.      Assessment:     Onychomycosis   Healing skin lesion.     Plan:     IE  Debridement of nails. RTC 3 months.  Gardiner Barefoot DPM

## 2015-10-30 ENCOUNTER — Emergency Department (HOSPITAL_COMMUNITY)
Admission: EM | Admit: 2015-10-30 | Discharge: 2015-10-30 | Disposition: A | Discharge status: Home / Self Care | Payer: Medicare Other | Attending: Emergency Medicine | Admitting: Emergency Medicine

## 2015-10-30 ENCOUNTER — Ambulatory Visit: Payer: Medicare Other

## 2015-10-30 ENCOUNTER — Emergency Department (HOSPITAL_COMMUNITY): Payer: Medicare Other

## 2015-10-30 DIAGNOSIS — Z79899 Other long term (current) drug therapy: Secondary | ICD-10-CM | POA: Diagnosis not present

## 2015-10-30 DIAGNOSIS — R109 Unspecified abdominal pain: Secondary | ICD-10-CM

## 2015-10-30 DIAGNOSIS — H9190 Unspecified hearing loss, unspecified ear: Secondary | ICD-10-CM | POA: Diagnosis not present

## 2015-10-30 DIAGNOSIS — K219 Gastro-esophageal reflux disease without esophagitis: Secondary | ICD-10-CM | POA: Diagnosis not present

## 2015-10-30 DIAGNOSIS — R1030 Lower abdominal pain, unspecified: Secondary | ICD-10-CM | POA: Diagnosis not present

## 2015-10-30 DIAGNOSIS — I10 Essential (primary) hypertension: Secondary | ICD-10-CM | POA: Insufficient documentation

## 2015-10-30 DIAGNOSIS — R1032 Left lower quadrant pain: Secondary | ICD-10-CM | POA: Insufficient documentation

## 2015-10-30 DIAGNOSIS — Z87891 Personal history of nicotine dependence: Secondary | ICD-10-CM | POA: Insufficient documentation

## 2015-10-30 DIAGNOSIS — Z7982 Long term (current) use of aspirin: Secondary | ICD-10-CM | POA: Insufficient documentation

## 2015-10-30 DIAGNOSIS — F419 Anxiety disorder, unspecified: Secondary | ICD-10-CM | POA: Diagnosis not present

## 2015-10-30 LAB — URINALYSIS, ROUTINE W REFLEX MICROSCOPIC
Bilirubin Urine: NEGATIVE
Glucose, UA: NEGATIVE mg/dL
Hgb urine dipstick: NEGATIVE
KETONES UR: NEGATIVE mg/dL
Leukocytes, UA: NEGATIVE
Nitrite: NEGATIVE
Protein, ur: NEGATIVE mg/dL
Specific Gravity, Urine: 1.031 — ABNORMAL HIGH (ref 1.005–1.030)
pH: 7.5 (ref 5.0–8.0)

## 2015-10-30 LAB — COMPREHENSIVE METABOLIC PANEL
ALBUMIN: 4.6 g/dL (ref 3.5–5.0)
ALK PHOS: 76 U/L (ref 38–126)
ALT: 25 U/L (ref 17–63)
ANION GAP: 9 (ref 5–15)
AST: 34 U/L (ref 15–41)
BILIRUBIN TOTAL: 0.8 mg/dL (ref 0.3–1.2)
BUN: 19 mg/dL (ref 6–20)
CALCIUM: 9.4 mg/dL (ref 8.9–10.3)
CO2: 26 mmol/L (ref 22–32)
CREATININE: 1.24 mg/dL (ref 0.61–1.24)
Chloride: 103 mmol/L (ref 101–111)
GFR calc Af Amer: 56 mL/min — ABNORMAL LOW (ref >60)
GFR calc non Af Amer: 49 mL/min — ABNORMAL LOW (ref >60)
GLUCOSE: 118 mg/dL — AB (ref 65–99)
Potassium: 4.2 mmol/L (ref 3.5–5.1)
Sodium: 138 mmol/L (ref 135–145)
Total Protein: 7.5 g/dL (ref 6.5–8.1)

## 2015-10-30 LAB — LIPASE, BLOOD: Lipase: 35 U/L (ref 11–51)

## 2015-10-30 LAB — CBC
HCT: 41.1 % (ref 39.0–52.0)
Hemoglobin: 13.9 g/dL (ref 13.0–17.0)
MCH: 32.8 pg (ref 26.0–34.0)
MCHC: 33.8 g/dL (ref 30.0–36.0)
MCV: 96.9 fL (ref 78.0–100.0)
Platelets: 200 10*3/uL (ref 150–400)
RBC: 4.24 MIL/uL (ref 4.22–5.81)
RDW: 13.4 % (ref 11.5–15.5)
WBC: 7.3 10*3/uL (ref 4.0–10.5)

## 2015-10-30 MED ORDER — IOHEXOL 300 MG/ML  SOLN
100.0000 mL | Freq: Once | INTRAMUSCULAR | Status: AC | PRN
  Administered 2015-10-30: 100 mL via INTRAVENOUS

## 2015-10-30 MED ORDER — IOHEXOL 300 MG/ML  SOLN
25.0000 mL | Freq: Once | INTRAMUSCULAR | Status: AC | PRN
  Administered 2015-10-30: 25 mL via ORAL

## 2015-10-30 NOTE — Discharge Instructions (Signed)

## 2015-10-30 NOTE — ED Notes (Signed)
Pt and family member aware of the need for urine. Family states pt has not had a regular amount of urine today and was unable to obtain a sample while waiting. Urinal is at bedside.

## 2015-10-30 NOTE — ED Provider Notes (Signed)
CSN: [id number]     Arrival date & time 10/30/15  1418 History   First MD Initiated Contact with Patient 10/30/15 1846     Chief Complaint  Patient presents with  . Abdominal Pain     (Consider location/radiation/quality/duration/timing/severity/associated sxs/prior Treatment) HPI Comments: The patient is a 82 year old male who presents under the care of his daughter, the patient does have dementia, level V caveat applies. He has complained of lower and left lower abdominal pain since approximately 11:00 this morning. He has had no vomiting or diarrhea. He has had a bowel movement at the daughter clarifies was "foul-smelling". He does have a history of diverticulitis, he does not have any fevers, no difficulty urinating and has had a very good appetite according to the daughter. His last meal was breakfast that she did not want them to keep eating after he developed pain.  Patient is a 82 y.o. male presenting with abdominal pain. The history is provided by the patient and a relative.  Abdominal Pain   Past Medical History  Diagnosis Date  . Memory loss   . OCD (obsessive compulsive disorder)   . Dysphagia     Mild  . Gastroesophageal reflux disease   . Degenerative arthritis   . Dyslipidemia   . Hearing loss   . Diverticulitis   . Left bundle branch block   . Nocturnal leg cramps   . Allergy   . Seizures ([place])   . Substance abuse   . Ulcer   . Cataract   . Anxiety   . Hypertension   . Hearing difficulty     bilateral hearing aids   Past Surgical History  Procedure Laterality Date  . Tonsillectomy    . Turp vaporization    . Cataract extraction Bilateral   . Basal cell carcinoma excision    . Prostate surgery    . Eye surgery    . Permanent pacemaker insertion N/A 01/03/2014    Procedure: PERMANENT PACEMAKER INSERTION;  Surgeon: Sanda Klein, MD;  Location: [place] CATH LAB;  Service: Cardiovascular;  Laterality: N/A;   Family History  Problem Relation Age of Onset  .  Heart attack Mother   . Dementia Father   . Heart attack Sister   . Heart disease Sister   . Heart attack Brother   . Heart attack Sister   . Heart attack Brother   . Stroke Brother   . Heart disease Daughter   . Heart disease Paternal Grandmother    Social History  Substance Use Topics  . Smoking status: Former Research scientist (life sciences)  . Smokeless tobacco: Never Used  . Alcohol Use: No     Comment: History of alcoholism, no longer drinking    Review of Systems  Gastrointestinal: Positive for abdominal pain.  All other systems reviewed and are negative.     Allergies  Sanctura and Biaxin  Home Medications   Prior to Admission medications   Medication Sig Start Date End Date Taking? Authorizing Provider  aspirin 81 MG tablet Take 81 mg by mouth daily.   Yes Historical Provider, MD  cholecalciferol (VITAMIN D) 1000 UNITS tablet Take 2,000 Units by mouth daily.    Yes Historical Provider, MD  donepezil (ARICEPT) 5 MG tablet TAKE 1 TABLET (5 MG TOTAL) BY MOUTH 2 (TWO) TIMES DAILY. Patient taking differently: Take 5 mg by mouth 2 (two) times daily.  05/20/15  Yes Kathrynn Ducking, MD  fluticasone [facility]) 50 MCG/ACT nasal spray Place 2 sprays into both nostrils daily.  01/23/15  Yes Leandrew Koyanagi, MD  memantine (NAMENDA) 10 MG tablet TAKE 1 TABLET (10 MG TOTAL) BY MOUTH 2 (TWO) TIMES DAILY. Patient taking differently: Take 10 mg by mouth 2 (two) times daily.  05/20/15  Yes Kathrynn Ducking, MD  mirabegron ER (MYRBETRIQ) 50 MG TB24 tablet Take 50 mg by mouth daily.   Yes Historical Provider, MD  Multiple Vitamins-Minerals (PRESERVISION AREDS) TABS Take 1 tablet by mouth daily.   Yes Historical Provider, MD  olopatadine (PATANOL) 0.1 % ophthalmic solution PLACE 1 DROP INTO BOTH EYES 2 (TWO) TIMES DAILY. 09/17/15  Yes Leandrew Koyanagi, MD  omeprazole (PRILOSEC) 20 MG capsule Take 1 capsule (20 mg total) by mouth daily. Take in morning on empty stomach. 10/08/15  Yes Leandrew Koyanagi, MD   Polyethyl Glycol-Propyl Glycol (SYSTANE OP) Place 1 drop into both eyes 2 (two) times daily.    Yes Historical Provider, MD  sertraline (ZOLOFT) 100 MG tablet Take 1.5 tablets (150 mg total) by mouth daily. 09/03/15  Yes Leandrew Koyanagi, MD   BP 138/64 mmHg  Pulse 61  Temp(Src) 97.7 F (36.5 C) (Oral)  Resp 17  SpO2 100% Physical Exam  Constitutional: He appears well-developed and well-nourished. No distress.  HENT:  Head: Normocephalic and atraumatic.  Mouth/Throat: Oropharynx is clear and moist. No oropharyngeal exudate.  Eyes: Conjunctivae and EOM are normal. Pupils are equal, round, and reactive to light. Right eye exhibits no discharge. Left eye exhibits no discharge. No scleral icterus.  Neck: Normal range of motion. Neck supple. No JVD present. No thyromegaly present.  Cardiovascular: Normal rate, regular rhythm, normal heart sounds and intact distal pulses.  Exam reveals no gallop and no friction rub.   No murmur heard. Pulmonary/Chest: Effort normal and breath sounds normal. No respiratory distress. He has no wheezes. He has no rales.  Abdominal: Soft. Bowel sounds are normal. He exhibits no distension and no mass. There is tenderness ( focal LLQ ttp - mild guarding - no R sided ttp).  Musculoskeletal: Normal range of motion. He exhibits no edema or tenderness.  Lymphadenopathy:    He has no cervical adenopathy.  Neurological: He is alert. Coordination normal.  Skin: Skin is warm and dry. No rash noted. No erythema.  Psychiatric: He has a normal mood and affect. His behavior is normal.  Nursing note and vitals reviewed.   ED Course  Procedures (including critical care time) Labs Review Labs Reviewed  COMPREHENSIVE METABOLIC PANEL - Abnormal; Notable for the following:    Glucose, Bld 118 (*)    GFR calc non Af Amer 49 (*)    GFR calc Af Amer 56 (*)    All other components within normal limits  URINALYSIS, ROUTINE W REFLEX MICROSCOPIC (NOT AT [facility]) - Abnormal;  Notable for the following:    Specific Gravity, Urine 1.031 (*)    All other components within normal limits  LIPASE, BLOOD  CBC    Imaging Review Ct Abdomen Pelvis W Contrast  10/30/2015  CLINICAL DATA:  Acute lower abdominal pain. EXAM: CT ABDOMEN AND PELVIS WITH CONTRAST TECHNIQUE: Multidetector CT imaging of the abdomen and pelvis was performed using the standard protocol following bolus administration of intravenous contrast. CONTRAST:  38mL OMNIPAQUE IOHEXOL 300 MG/ML SOLN, 182mL OMNIPAQUE IOHEXOL 300 MG/ML SOLN COMPARISON:  CT scan of April 21, 2015. FINDINGS: Visualized lung bases are unremarkable. No significant osseous abnormality is noted. No gallstones are noted. The liver and pancreas appear normal. Stable thickening of splenic capsule  is noted consistent with old subcapsular hematoma. Adrenal glands and kidneys appear normal. No hydronephrosis or renal obstruction is noted. No renal or ureteral calculi are noted. Atherosclerosis of abdominal aorta is noted without aneurysm formation. The appendix appears normal. There is no evidence of bowel obstruction. Sigmoid diverticulosis is noted without inflammation. Urinary bladder appears normal. No significant adenopathy is noted. IMPRESSION: No acute abnormality seen in the abdomen or pelvis. Electronically Signed   By: Marijo Conception, M.D.   On: 10/30/2015 20:18   I have personally reviewed and evaluated these images and lab results as part of my medical decision-making.    MDM   Final diagnoses:  Abdominal pain    The patient has vital signs which are unremarkable, I am concerned that he may have either diverticulitis constipation or possible perforation or abscess secondary to bowel problems. CT scans ordered, labs ordered, CT ordered.  Pt has unremarkable  VS.  No fever or tachycardia.  UA neg  In addition to written d/c instructions, the pt was given verbal d/c instructions including the indications for return and expressed  understanding to the instructions.  Meds given in ED:  Medications  iohexol (OMNIPAQUE) 300 MG/ML solution 25 mL (25 mLs Oral Contrast Given 10/30/15 1915)  iohexol (OMNIPAQUE) 300 MG/ML solution 100 mL (100 mLs Intravenous Contrast Given 10/30/15 1953)    New Prescriptions   No medications on file        Noemi Chapel, MD 10/30/15 2123

## 2015-10-30 NOTE — ED Notes (Signed)
Pt complains of pain in lower abdomen since 11AM. Pt denies nausea/vomiting/diarrhea. Pt had a bowel movement today, which pt's daughter states was very foul smelling. Pt still has appendix. Pt has hx of diverticulitis. Pain is tender to touch.

## 2015-10-30 NOTE — ED Notes (Signed)
Patient transported to CT 

## 2015-11-03 ENCOUNTER — Encounter: Payer: Self-pay | Admitting: Internal Medicine

## 2015-11-17 ENCOUNTER — Encounter: Payer: Self-pay | Admitting: Internal Medicine

## 2015-11-18 ENCOUNTER — Telehealth: Payer: Self-pay | Admitting: Family Medicine

## 2015-11-18 DIAGNOSIS — B001 Herpesviral vesicular dermatitis: Secondary | ICD-10-CM

## 2015-11-18 MED ORDER — VALACYCLOVIR HCL 1 G PO TABS: ORAL_TABLET | ORAL | Status: DC

## 2015-11-18 NOTE — Telephone Encounter (Signed)
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 He keeps touching it and then touches his eye etc. He is not understanding leave it alone and not to touch his eye and anything else. She doesn't want to have to bring him in because he is 66 and she does not want to risk exposing him to flu or any other illness. That is why she is requesting. Please advise

## 2015-11-18 NOTE — Telephone Encounter (Signed)
Pt daughter Mateo Flow notified.  She asked if the pill was large because it is hard for pt to swallow.  Advised her to ask the pharmacist before she purchase the medication she needs to look at and if he cannot swallow that size then look at the 500mg .  If he cannot swallow the 1000mg  and the 500mg  are better to call us and let us know so we can send in a new rx for the smaller tabs.

## 2015-11-18 NOTE — Telephone Encounter (Signed)
I have sent to pharmacy. He should take 2 tabs once and then 2 more pills in 12 hours, then stop. I have given 1 refill for future use if needed.

## 2015-11-20 ENCOUNTER — Ambulatory Visit: Payer: Medicare Other | Admitting: Adult Health

## 2015-12-08 ENCOUNTER — Ambulatory Visit (INDEPENDENT_AMBULATORY_CARE_PROVIDER_SITE_OTHER): Payer: Medicare Other | Admitting: *Deleted

## 2015-12-08 DIAGNOSIS — I441 Atrioventricular block, second degree: Secondary | ICD-10-CM

## 2015-12-08 DIAGNOSIS — Z950 Presence of cardiac pacemaker: Secondary | ICD-10-CM

## 2015-12-08 NOTE — Progress Notes (Signed)
Remote pacemaker transmission.   

## 2015-12-23 ENCOUNTER — Ambulatory Visit (INDEPENDENT_AMBULATORY_CARE_PROVIDER_SITE_OTHER): Payer: Medicare Other | Admitting: Podiatry

## 2015-12-23 ENCOUNTER — Encounter: Payer: Self-pay | Admitting: Podiatry

## 2015-12-23 DIAGNOSIS — Q828 Other specified congenital malformations of skin: Secondary | ICD-10-CM

## 2015-12-23 NOTE — Progress Notes (Signed)
Subjective:     Patient ID: Daniel [name], male   DOB: October 19, 1920, 82 y.o.   MRN: [id number]  HPI this patient presents the office today with a painful area on the outside of his right midfoot. He states that this area is painful wearing shoes. The skin lesion has become red and inflamed and painful to the touch. This the same area that he was seen in the emergency room and x-ray was taken months ago.  He presents the office today for evaluation and treatment of this condition   Review of Systems     Objective:   Physical Exam     Physical Exam GENERAL APPEARANCE: Alert, conversant. Appropriately groomed. No acute distress.  VASCULAR: Pedal pulses palpable at [facility] and PT bilateral. Capillary refill time is immediate to all digits, Normal temperature gradient. Digital hair growth is present bilateral  NEUROLOGIC: sensation is normal to 5.07 monofilament at 5/5 sites bilateral. Light touch is intact bilateral, Muscle strength normal.  MUSCULOSKELETAL: acceptable muscle strength, tone and stability bilateral. Intrinsic muscluature intact bilateral. Rectus appearance of foot and digits noted bilateral.   DERMATOLOGIC: skin color, texture, and turgor are within normal limits. No preulcerative lesions or ulcers are seen, no interdigital maceration noted. No open lesions present. . No drainage noted.  Porokeratotic lesion lateral base fifth metabase right foot.  NAILS Thick disfigured discolored nails all toes both feet            Assessment:     Porokeratosis right foot     Plan:     Debride porokeratosis under local anesthesia.  Discussed proper footgear to wear.  RTC 4 weeeks.   Gardiner Barefoot DPM

## 2015-12-30 LAB — CUP PACEART REMOTE DEVICE CHECK
Battery Remaining Longevity: 109 mo
Battery Remaining Percentage: 95.5 %
Brady Statistic AP VS Percent: 8.9 %
Brady Statistic AS VS Percent: 6.5 %
Implantable Lead Implant Date: 20150326
Implantable Lead Implant Date: 20150326
Implantable Lead Location: 753859
Lead Channel Pacing Threshold Amplitude: 0.75 V
Lead Channel Pacing Threshold Pulse Width: 0.5 ms
Lead Channel Sensing Intrinsic Amplitude: 2.6 mV
Lead Channel Setting Pacing Amplitude: 2.5 V
Lead Channel Setting Pacing Pulse Width: 0.5 ms
MDC IDC LEAD LOCATION: 753860
MDC IDC MSMT BATTERY VOLTAGE: 2.99 V
MDC IDC MSMT LEADCHNL RA IMPEDANCE VALUE: 510 Ohm
MDC IDC MSMT LEADCHNL RA PACING THRESHOLD AMPLITUDE: 0.75 V
MDC IDC MSMT LEADCHNL RA PACING THRESHOLD PULSEWIDTH: 0.5 ms
MDC IDC MSMT LEADCHNL RV IMPEDANCE VALUE: 480 Ohm
MDC IDC MSMT LEADCHNL RV SENSING INTR AMPL: 12 mV
MDC IDC SESS DTM: 20170227080909
MDC IDC SET LEADCHNL RA PACING AMPLITUDE: 2 V
MDC IDC SET LEADCHNL RV SENSING SENSITIVITY: 4 mV
MDC IDC STAT BRADY AP VP PERCENT: 39 %
MDC IDC STAT BRADY AS VP PERCENT: 45 %
MDC IDC STAT BRADY RA PERCENT PACED: 48 %
MDC IDC STAT BRADY RV PERCENT PACED: 85 %
Pulse Gen Model: 2240
Pulse Gen Serial Number: 7602208

## 2015-12-31 ENCOUNTER — Encounter: Payer: Self-pay | Admitting: Cardiology

## 2016-01-13 DIAGNOSIS — Z8719 Personal history of other diseases of the digestive system: Secondary | ICD-10-CM | POA: Diagnosis not present

## 2016-01-13 DIAGNOSIS — Z6826 Body mass index (BMI) 26.0-26.9, adult: Secondary | ICD-10-CM | POA: Diagnosis not present

## 2016-01-13 DIAGNOSIS — F0280 Dementia in other diseases classified elsewhere without behavioral disturbance: Secondary | ICD-10-CM | POA: Diagnosis not present

## 2016-01-13 DIAGNOSIS — I10 Essential (primary) hypertension: Secondary | ICD-10-CM | POA: Diagnosis not present

## 2016-01-13 DIAGNOSIS — F411 Generalized anxiety disorder: Secondary | ICD-10-CM | POA: Diagnosis not present

## 2016-01-13 DIAGNOSIS — Z950 Presence of cardiac pacemaker: Secondary | ICD-10-CM | POA: Diagnosis not present

## 2016-01-13 DIAGNOSIS — G301 Alzheimer's disease with late onset: Secondary | ICD-10-CM | POA: Diagnosis not present

## 2016-01-16 ENCOUNTER — Telehealth: Payer: Self-pay | Admitting: Neurology

## 2016-01-16 MED ORDER — MEMANTINE HCL 10 MG PO TABS: ORAL_TABLET | ORAL | Status: DC

## 2016-01-16 NOTE — Telephone Encounter (Signed)
I called the daughter. The patient has been on brand-name Namenda 10 mg twice daily, I have no problem with switching to generic at this time. I called in a prescription.

## 2016-01-16 NOTE — Telephone Encounter (Signed)
Daughter Mateo Flow called to advise, insurance has dropped Namenda from their formulary, wants to know if okay to go to generic MEMANTINE?

## 2016-01-21 ENCOUNTER — Encounter: Payer: Self-pay | Admitting: Podiatry

## 2016-01-21 ENCOUNTER — Ambulatory Visit (INDEPENDENT_AMBULATORY_CARE_PROVIDER_SITE_OTHER): Payer: Medicare Other | Admitting: Podiatry

## 2016-01-21 DIAGNOSIS — M79609 Pain in unspecified limb: Principal | ICD-10-CM

## 2016-01-21 DIAGNOSIS — B351 Tinea unguium: Secondary | ICD-10-CM | POA: Diagnosis not present

## 2016-01-21 NOTE — Progress Notes (Signed)
Subjective:     Patient ID: Daniel [name], male   DOB: 1921/04/24, 82 y.o.   MRN: [id number]  HPI this patient presents to the office with chief complaint of long thick nails. He also has a history of the skin lesion on the outside of the right foot.   No evidence of redness, swelling or infection noted on the outside of the right foot. Patient has thick disfigured discolored toenails which are painful walking and wearing his shoes. He presents for preventative foot care services and  evaluation of his skin lesion.   Review of Systems     Objective:   Physical Exam GENERAL APPEARANCE: Alert, conversant. Appropriately groomed. No acute distress.  VASCULAR: Pedal pulses palpable at  [facility] and PT bilateral.  Capillary refill time is immediate to all digits,  Normal temperature gradient.  Digital hair growth is present bilateral  NEUROLOGIC: sensation is normal to 5.07 monofilament at 5/5 sites bilateral.  Light touch is intact bilateral, Muscle strength normal.  MUSCULOSKELETAL: acceptable muscle strength, tone and stability bilateral.  Intrinsic muscluature intact bilateral.  Rectus appearance of foot and digits noted bilateral.   DERMATOLOGIC: skin color, texture, and turgor are within normal limits.  No preulcerative lesions or ulcers  are seen, no interdigital maceration noted.  No open lesions present.  . No drainage noted.Healing skin lesion with no evidence of infection or drainage.  NAILS  Thick disfigured discolored nails all toes both feet.      Assessment:     Onychomycosis   Healing skin lesion.     Plan:     IE  Debridement of nails. RTC 6 weeks  Gardiner Barefoot DPM

## 2016-02-11 ENCOUNTER — Emergency Department (HOSPITAL_COMMUNITY): Payer: Medicare Other

## 2016-02-11 ENCOUNTER — Emergency Department (HOSPITAL_COMMUNITY)
Admission: EM | Admit: 2016-02-11 | Discharge: 2016-02-11 | Disposition: A | Discharge status: Home / Self Care | Payer: Medicare Other | Attending: Emergency Medicine | Admitting: Emergency Medicine

## 2016-02-11 ENCOUNTER — Encounter (HOSPITAL_COMMUNITY): Payer: Self-pay | Admitting: Emergency Medicine

## 2016-02-11 DIAGNOSIS — Z87891 Personal history of nicotine dependence: Secondary | ICD-10-CM | POA: Insufficient documentation

## 2016-02-11 DIAGNOSIS — Y9301 Activity, walking, marching and hiking: Secondary | ICD-10-CM | POA: Insufficient documentation

## 2016-02-11 DIAGNOSIS — K219 Gastro-esophageal reflux disease without esophagitis: Secondary | ICD-10-CM | POA: Insufficient documentation

## 2016-02-11 DIAGNOSIS — S6992XA Unspecified injury of left wrist, hand and finger(s), initial encounter: Secondary | ICD-10-CM | POA: Diagnosis not present

## 2016-02-11 DIAGNOSIS — M1990 Unspecified osteoarthritis, unspecified site: Secondary | ICD-10-CM | POA: Insufficient documentation

## 2016-02-11 DIAGNOSIS — I10 Essential (primary) hypertension: Secondary | ICD-10-CM | POA: Insufficient documentation

## 2016-02-11 DIAGNOSIS — Z7982 Long term (current) use of aspirin: Secondary | ICD-10-CM | POA: Insufficient documentation

## 2016-02-11 DIAGNOSIS — W108XXA Fall (on) (from) other stairs and steps, initial encounter: Secondary | ICD-10-CM | POA: Insufficient documentation

## 2016-02-11 DIAGNOSIS — Z7951 Long term (current) use of inhaled steroids: Secondary | ICD-10-CM | POA: Insufficient documentation

## 2016-02-11 DIAGNOSIS — Z23 Encounter for immunization: Secondary | ICD-10-CM | POA: Diagnosis not present

## 2016-02-11 DIAGNOSIS — S8992XA Unspecified injury of left lower leg, initial encounter: Secondary | ICD-10-CM | POA: Diagnosis not present

## 2016-02-11 DIAGNOSIS — F0390 Unspecified dementia without behavioral disturbance: Secondary | ICD-10-CM | POA: Insufficient documentation

## 2016-02-11 DIAGNOSIS — S60519A Abrasion of unspecified hand, initial encounter: Secondary | ICD-10-CM

## 2016-02-11 DIAGNOSIS — S80211A Abrasion, right knee, initial encounter: Secondary | ICD-10-CM | POA: Insufficient documentation

## 2016-02-11 DIAGNOSIS — S8991XA Unspecified injury of right lower leg, initial encounter: Secondary | ICD-10-CM | POA: Diagnosis not present

## 2016-02-11 DIAGNOSIS — Y9289 Other specified places as the place of occurrence of the external cause: Secondary | ICD-10-CM | POA: Insufficient documentation

## 2016-02-11 DIAGNOSIS — S80219A Abrasion, unspecified knee, initial encounter: Secondary | ICD-10-CM

## 2016-02-11 DIAGNOSIS — Y998 Other external cause status: Secondary | ICD-10-CM | POA: Insufficient documentation

## 2016-02-11 DIAGNOSIS — S6991XA Unspecified injury of right wrist, hand and finger(s), initial encounter: Secondary | ICD-10-CM | POA: Diagnosis not present

## 2016-02-11 DIAGNOSIS — Z79899 Other long term (current) drug therapy: Secondary | ICD-10-CM | POA: Insufficient documentation

## 2016-02-11 DIAGNOSIS — S60512A Abrasion of left hand, initial encounter: Secondary | ICD-10-CM | POA: Insufficient documentation

## 2016-02-11 DIAGNOSIS — H9190 Unspecified hearing loss, unspecified ear: Secondary | ICD-10-CM | POA: Insufficient documentation

## 2016-02-11 DIAGNOSIS — Z8639 Personal history of other endocrine, nutritional and metabolic disease: Secondary | ICD-10-CM | POA: Diagnosis not present

## 2016-02-11 DIAGNOSIS — S80212A Abrasion, left knee, initial encounter: Secondary | ICD-10-CM | POA: Diagnosis not present

## 2016-02-11 DIAGNOSIS — S60511A Abrasion of right hand, initial encounter: Secondary | ICD-10-CM | POA: Diagnosis not present

## 2016-02-11 DIAGNOSIS — Z8659 Personal history of other mental and behavioral disorders: Secondary | ICD-10-CM | POA: Insufficient documentation

## 2016-02-11 DIAGNOSIS — W19XXXA Unspecified fall, initial encounter: Secondary | ICD-10-CM

## 2016-02-11 MED ORDER — ACETAMINOPHEN 325 MG PO TABS
650.0000 mg | ORAL_TABLET | Freq: Once | ORAL | Status: AC
  Administered 2016-02-11: 650 mg via ORAL
  Filled 2016-02-11: qty 2

## 2016-02-11 MED ORDER — TETANUS-DIPHTH-ACELL PERTUSSIS 5-2.5-18.5 LF-MCG/0.5 IM SUSP
0.5000 mL | Freq: Once | INTRAMUSCULAR | Status: AC
  Administered 2016-02-11: 0.5 mL via INTRAMUSCULAR
  Filled 2016-02-11: qty 0.5

## 2016-02-11 NOTE — ED Notes (Signed)
Pt brought in by daughter that states pt fell down his steps  Pt has dementia  Pt has abrasions to both hands and his right knee  Pt is not c/o pain at this time  Pt does not take any blood thinners per family

## 2016-02-11 NOTE — ED Provider Notes (Signed)
CSN: [id number]     Arrival date & time 02/11/16  2028 History   First MD Initiated Contact with Patient 02/11/16 2122     Chief Complaint  Patient presents with  . Fall     (Consider location/radiation/quality/duration/timing/severity/associated sxs/prior Treatment) The history is provided by the patient.  Daniel [name] is a 82 y.o. male hx of HL, HTN, Here presenting with fall. Patient was in bed and decided to go and find his daughter and walked downstairs and tripped over the curb and landed on the lateral knees and hands. Denies any head injuries or syncope. Denies any loss of consciousness. Not currently on blood thinners. Tetanus about 9 years ago.     Level V caveat- dementia   Past Medical History  Diagnosis Date  . Memory loss   . OCD (obsessive compulsive disorder)   . Dysphagia     Mild  . Gastroesophageal reflux disease   . Degenerative arthritis   . Dyslipidemia   . Hearing loss   . Diverticulitis   . Left bundle branch block   . Nocturnal leg cramps   . Allergy   . Seizures ([place])   . Substance abuse   . Ulcer   . Cataract   . Anxiety   . Hypertension   . Hearing difficulty     bilateral hearing aids   Past Surgical History  Procedure Laterality Date  . Tonsillectomy    . Turp vaporization    . Cataract extraction Bilateral   . Basal cell carcinoma excision    . Prostate surgery    . Eye surgery    . Permanent pacemaker insertion N/A 01/03/2014    Procedure: PERMANENT PACEMAKER INSERTION;  Surgeon: Sanda Klein, MD;  Location: [place] CATH LAB;  Service: Cardiovascular;  Laterality: N/A;   Family History  Problem Relation Age of Onset  . Heart attack Mother   . Dementia Father   . Heart attack Sister   . Heart disease Sister   . Heart attack Brother   . Heart attack Sister   . Heart attack Brother   . Stroke Brother   . Heart disease Daughter   . Heart disease Paternal Grandmother    Social History  Substance Use Topics  . Smoking status:  Former Research scientist (life sciences)  . Smokeless tobacco: Never Used  . Alcohol Use: No     Comment: History of alcoholism, no longer drinking    Review of Systems  Skin: Positive for wound.  All other systems reviewed and are negative.     Allergies  Sanctura and Biaxin  Home Medications   Prior to Admission medications   Medication Sig Start Date End Date Taking? Authorizing Provider  aspirin 81 MG tablet Take 81 mg by mouth daily.    Historical Provider, MD  cholecalciferol (VITAMIN D) 1000 UNITS tablet Take 2,000 Units by mouth daily.     Historical Provider, MD  donepezil (ARICEPT) 5 MG tablet TAKE 1 TABLET (5 MG TOTAL) BY MOUTH 2 (TWO) TIMES DAILY. Patient taking differently: Take 5 mg by mouth 2 (two) times daily.  05/20/15   Kathrynn Ducking, MD  fluticasone (FLONASE) 50 MCG/ACT nasal spray Place 2 sprays into both nostrils daily. 01/23/15   Leandrew Koyanagi, MD  memantine (NAMENDA) 10 MG tablet TAKE 1 TABLET (10 MG TOTAL) BY MOUTH 2 (TWO) TIMES DAILY. 01/16/16   Kathrynn Ducking, MD  mirabegron ER (MYRBETRIQ) 50 MG TB24 tablet Take 50 mg by mouth daily.  Historical Provider, MD  Multiple Vitamins-Minerals (PRESERVISION AREDS) TABS Take 1 tablet by mouth daily.    Historical Provider, MD  olopatadine (PATANOL) 0.1 % ophthalmic solution PLACE 1 DROP INTO BOTH EYES 2 (TWO) TIMES DAILY. 09/17/15   Leandrew Koyanagi, MD  omeprazole (PRILOSEC) 20 MG capsule Take 1 capsule (20 mg total) by mouth daily. Take in morning on empty stomach. 10/08/15   Leandrew Koyanagi, MD  Polyethyl Glycol-Propyl Glycol (SYSTANE OP) Place 1 drop into both eyes 2 (two) times daily.     Historical Provider, MD  sertraline (ZOLOFT) 100 MG tablet Take 1.5 tablets (150 mg total) by mouth daily. 09/03/15   Leandrew Koyanagi, MD  valACYclovir (VALTREX) 1000 MG tablet Take 2 tabs po, repeat in 12 hours, then stop 11/18/15   Bennett Scrape V, PA-C   BP 136/63 mmHg  Pulse 59  Temp(Src) 98.1 F (36.7 C) (Oral)  Resp 20  Ht 5\' 7"   (1.702 m)  Wt 154 lb (69.854 kg)  BMI 24.11 kg/m2  SpO2 97% Physical Exam  Constitutional:  Demented, NAD   HENT:  Head: Normocephalic and atraumatic.  Mouth/Throat: Oropharynx is clear and moist.  Eyes: Conjunctivae are normal. Pupils are equal, round, and reactive to light.  Neck: Normal range of motion. Neck supple.  No midline tenderness   Cardiovascular: Normal rate, regular rhythm and normal heart sounds.   Pulmonary/Chest: Effort normal and breath sounds normal. No respiratory distress. He has no wheezes. He has no rales.  Abdominal: Soft. Bowel sounds are normal. He exhibits no distension. There is no tenderness. There is no rebound.  Musculoskeletal:  Road rash and abrasions on hands, no laceration or obvious foreign body or deformity. Abrasions bilateral knees, nl ROM. Pelvis stable, nl ROM bilateral hips   Neurological: He is alert.  Demented, nl strength throughout   Skin: Skin is warm and dry. Rash noted.  Psychiatric: He has a normal mood and affect. His behavior is normal. Judgment and thought content normal.  Nursing note and vitals reviewed.   ED Course  Procedures (including critical care time) Labs Review Labs Reviewed - No data to display  Imaging Review Dg Knee Complete 4 Views Left  02/11/2016  CLINICAL DATA:  [place] down steps today EXAM: LEFT KNEE - COMPLETE 4+ VIEW COMPARISON:  None. FINDINGS: There is no evidence of fracture, dislocation, or joint effusion. There is no evidence of arthropathy or other focal bone abnormality. Soft tissues are unremarkable. IMPRESSION: Negative. Electronically Signed   By: Andreas Newport M.D.   On: 02/11/2016 22:11   Dg Knee Complete 4 Views Right  02/11/2016  CLINICAL DATA:  [place] down steps today EXAM: RIGHT KNEE - COMPLETE 4+ VIEW COMPARISON:  None. FINDINGS: There is no evidence of fracture, dislocation, or joint effusion. There is no evidence of arthropathy or other focal bone abnormality. Soft tissues are unremarkable.  IMPRESSION: Negative. Electronically Signed   By: Andreas Newport M.D.   On: 02/11/2016 22:12   Dg Hand Complete Left  02/11/2016  CLINICAL DATA:  [place] down steps today. EXAM: LEFT HAND - COMPLETE 3+ VIEW COMPARISON:  None. FINDINGS: Negative for acute fracture, dislocation or radiopaque foreign body. There is severe arthritis at the first [facility] joint. No acute soft tissue abnormality. IMPRESSION: Negative for acute fracture or dislocation. Electronically Signed   By: Andreas Newport M.D.   On: 02/11/2016 22:09   Dg Hand Complete Right  02/11/2016  CLINICAL DATA:  [place] down steps today EXAM: RIGHT HAND -  COMPLETE 3+ VIEW COMPARISON:  None. FINDINGS: Negative for acute fracture, dislocation or radiopaque foreign body. Severe arthritic changes are present about the first [facility] joint. IMPRESSION: Negative for acute fracture or dislocation. Electronically Signed   By: Andreas Newport M.D.   On: 02/11/2016 22:10   I have personally reviewed and evaluated these images and lab results as part of my medical decision-making.   EKG Interpretation   Date/Time:  Wednesday Feb 11 2016 22:06:27 EDT Ventricular Rate:  60 PR Interval:  168 QRS Duration: 139 QT Interval:  446 QTC Calculation: 446 R Axis:   -12 Text Interpretation:  A-V dual-paced rhythm with some inhibition No  further analysis attempted due to paced rhythm Baseline wander in lead(s)  V4 No significant change since last tracing Confirmed by YAO  MD, DAVID  ([id number]) on 02/11/2016 10:19:07 PM      MDM   Final diagnoses:  None   Daniel [name] is a 82 y.o. male here with fall, no head or neck injury. Will get xrays, up date tdap.  11:12 PM Xrays showed no fracture. Will dc home with daughter.     Wandra Arthurs, MD 02/11/16 2312

## 2016-02-11 NOTE — Discharge Instructions (Signed)
Take tylenol 500 mg every 6 hrs as needed for pain.   See your doctor.   Return to ER if you have severe pain, unable to walk, passing out.

## 2016-02-17 ENCOUNTER — Other Ambulatory Visit: Payer: Self-pay

## 2016-02-17 DIAGNOSIS — M8580 Other specified disorders of bone density and structure, unspecified site: Secondary | ICD-10-CM | POA: Diagnosis not present

## 2016-02-17 DIAGNOSIS — Z6826 Body mass index (BMI) 26.0-26.9, adult: Secondary | ICD-10-CM | POA: Diagnosis not present

## 2016-02-17 DIAGNOSIS — M79644 Pain in right finger(s): Secondary | ICD-10-CM | POA: Diagnosis not present

## 2016-02-17 MED ORDER — FLUTICASONE PROPIONATE 50 MCG/ACT NA SUSP: 2.0000 | Freq: Every day | NASAL | Status: DC

## 2016-02-18 ENCOUNTER — Ambulatory Visit (INDEPENDENT_AMBULATORY_CARE_PROVIDER_SITE_OTHER): Payer: Medicare Other | Admitting: Neurology

## 2016-02-18 ENCOUNTER — Encounter: Payer: Self-pay | Admitting: Neurology

## 2016-02-18 VITALS — BP 128/54 | HR 54 | Ht 67.0 in | Wt 163.5 lb

## 2016-02-18 DIAGNOSIS — F0280 Dementia in other diseases classified elsewhere without behavioral disturbance: Secondary | ICD-10-CM | POA: Diagnosis not present

## 2016-02-18 DIAGNOSIS — G301 Alzheimer's disease with late onset: Secondary | ICD-10-CM | POA: Diagnosis not present

## 2016-02-18 NOTE — Progress Notes (Signed)
Reason for visit: Dementia  Daniel [name] is an 82 y.o. male  History of present illness:  Daniel [name] is a 82 year old right-handed white male with a history of a progressive memory disorder. The patient lives in his own home, but fortunately he lives right next to his daughter. His daughter is involved in his care every day. The patient has had some ongoing progression of his memory issues. He is having difficulty at times fixing meals, he will sometimes pour ice tea into his cereal instead of milk. He may over cook meals in the microwave. The patient has had occasional falls. He has fallen on 2 occasions walking to his daughter's house in the yard. He has a cane and a walker, but he does not use this. The patient does not operate a motor vehicle. He generally will sleep fairly well at night, without tendency to wander. There are no other safety issues at home. The patient returns to this office for an evaluation. He is on Namenda and Aricept.  Past Medical History  Diagnosis Date  . Memory loss   . OCD (obsessive compulsive disorder)   . Dysphagia     Mild  . Gastroesophageal reflux disease   . Degenerative arthritis   . Dyslipidemia   . Hearing loss   . Diverticulitis   . Left bundle branch block   . Nocturnal leg cramps   . Allergy   . Seizures ([place])   . Substance abuse   . Ulcer   . Cataract   . Anxiety   . Hypertension   . Hearing difficulty     bilateral hearing aids    Past Surgical History  Procedure Laterality Date  . Tonsillectomy    . Turp vaporization    . Cataract extraction Bilateral   . Basal cell carcinoma excision    . Prostate surgery    . Eye surgery    . Permanent pacemaker insertion N/A 01/03/2014    Procedure: PERMANENT PACEMAKER INSERTION;  Surgeon: Sanda Klein, MD;  Location: [place] CATH LAB;  Service: Cardiovascular;  Laterality: N/A;    Family History  Problem Relation Age of Onset  . Heart attack Mother   . Dementia Father   . Heart  attack Sister   . Heart disease Sister   . Heart attack Brother   . Heart attack Sister   . Heart attack Brother   . Stroke Brother   . Heart disease Daughter   . Heart disease Paternal Grandmother     Social history:  reports that he has quit smoking. He has never used smokeless tobacco. He reports that he does not drink alcohol or use illicit drugs.    Allergies  Allergen Reactions  . Sanctura [Trospium]     Seizures, body spasms  . Biaxin [Clarithromycin] Hives    Medications:  Prior to Admission medications   Medication Sig Start Date End Date Taking? Authorizing Provider  aspirin 81 MG tablet Take 81 mg by mouth daily.   Yes Historical Provider, MD  cholecalciferol (VITAMIN D) 1000 UNITS tablet Take 2,000 Units by mouth daily.    Yes Historical Provider, MD  donepezil (ARICEPT) 5 MG tablet TAKE 1 TABLET (5 MG TOTAL) BY MOUTH 2 (TWO) TIMES DAILY. Patient taking differently: Take 5 mg by mouth 2 (two) times daily.  05/20/15  Yes Kathrynn Ducking, MD  fluticasone [facility]) 50 MCG/ACT nasal spray Place 2 sprays into both nostrils daily. 02/17/16  Yes Leandrew Koyanagi, MD  memantine (NAMENDA) 10 MG tablet TAKE 1 TABLET (10 MG TOTAL) BY MOUTH 2 (TWO) TIMES DAILY. 01/16/16  Yes Kathrynn Ducking, MD  mirabegron ER (MYRBETRIQ) 50 MG TB24 tablet Take 50 mg by mouth daily.   Yes Historical Provider, MD  Multiple Vitamins-Minerals (PRESERVISION AREDS) TABS Take 1 tablet by mouth daily.   Yes Historical Provider, MD  olopatadine (PATANOL) 0.1 % ophthalmic solution PLACE 1 DROP INTO BOTH EYES 2 (TWO) TIMES DAILY. 09/17/15  Yes Leandrew Koyanagi, MD  omeprazole (PRILOSEC) 20 MG capsule Take 1 capsule (20 mg total) by mouth daily. Take in morning on empty stomach. 10/08/15  Yes Leandrew Koyanagi, MD  Polyethyl Glycol-Propyl Glycol (SYSTANE OP) Place 1 drop into both eyes 2 (two) times daily.    Yes Historical Provider, MD  sertraline (ZOLOFT) 100 MG tablet Take 1.5 tablets (150 mg total) by mouth  daily. 09/03/15  Yes Leandrew Koyanagi, MD  valACYclovir (VALTREX) 1000 MG tablet Take 2 tabs po, repeat in 12 hours, then stop 11/18/15  Yes Bennett Scrape V, PA-C    ROS:  Out of a complete 14 system review of symptoms, the patient complains only of the following symptoms, and all other reviewed systems are negative.  Decreased activity, fatigue Runny nose, drooling Eye itching Constipation, diarrhea, incontinence of bowels Daytime sleepiness Incontinence of bladder, urinary urgency Achy muscles, muscle cramps, walking difficulty Memory loss, speech difficulty Confusion, decreased concentration  Blood pressure 128/54, pulse 54, height 5\' 7"  (1.702 m), weight 163 lb 8 oz (74.163 kg).  Physical Exam  General: The patient is alert and cooperative at the time of the examination.  Skin: No significant peripheral edema is noted.   Neurologic Exam  Mental status: The patient is alert and oriented x 1 at the time of the examination (not oriented to date or place). The Mini-Mental Status Examination done today shows a total score of 16/30..   Cranial nerves: Facial symmetry is present. Speech is normal, no aphasia or dysarthria is noted. Extraocular movements are full. Visual fields are full.  Motor: The patient has good strength in all 4 extremities.  Sensory examination: Soft touch sensation is symmetric on the face, arms, and legs.  Coordination: The patient has good finger-nose-finger and heel-to-shin bilaterally. Some apraxia with the use of the extremities is noted.  Gait and station: The patient has a wide-based, slightly unsteady gait. Tandem gait was not attempted. Romberg is negative. No drift is seen.  Reflexes: Deep tendon reflexes are symmetric, but are depressed.   Assessment/Plan:  1. Progressive memory disturbance, Alzheimer's disease  2. Gait disturbance  The patient has had some recent falls. I have encouraged him to use a cane with ambulation outside the  house. The patient is currently living in his own house, the family is helping out with medications, managing his finances. The patient will continue on Aricept and Namenda. He will follow-up in 6 months, sooner if needed.  Jill Alexanders MD 02/18/2016 7:30 PM  [facility] [address] [place], [place] [address]  Phone [phone] Fax [phone]

## 2016-03-03 ENCOUNTER — Ambulatory Visit: Payer: Medicare Other | Admitting: Podiatry

## 2016-03-05 ENCOUNTER — Ambulatory Visit (INDEPENDENT_AMBULATORY_CARE_PROVIDER_SITE_OTHER): Payer: Medicare Other | Admitting: Cardiovascular Disease

## 2016-03-05 ENCOUNTER — Encounter: Payer: Self-pay | Admitting: Cardiovascular Disease

## 2016-03-05 VITALS — BP 133/62 | HR 59 | Ht 68.0 in | Wt 162.0 lb

## 2016-03-05 DIAGNOSIS — I5032 Chronic diastolic (congestive) heart failure: Secondary | ICD-10-CM

## 2016-03-05 DIAGNOSIS — I441 Atrioventricular block, second degree: Secondary | ICD-10-CM | POA: Diagnosis not present

## 2016-03-05 DIAGNOSIS — Z950 Presence of cardiac pacemaker: Secondary | ICD-10-CM

## 2016-03-05 DIAGNOSIS — F0390 Unspecified dementia without behavioral disturbance: Secondary | ICD-10-CM

## 2016-03-05 DIAGNOSIS — R0602 Shortness of breath: Secondary | ICD-10-CM

## 2016-03-05 NOTE — Progress Notes (Signed)
Patient ID: Daniel [name], male   DOB: Apr 02, 1921, 82 y.o.   MRN: [id number]    Cardiology Office Note    Date:  03/06/2016   ID:  Daniel [name], DOB [birth year], MRN [id number]  PCP:  Phineas Inches, MD  Cardiologist:   Sanda Klein, MD   Chief Complaint  Patient presents with  . Pacemaker Check    1 year  pt daughter states she has noticed occasional SOB; fell recently--loss of balance; swelling in ankles    History of Present Illness:  Daniel [name] is a 82 y.o. male with high-grade second-degree AV block and a dual-chamber permanent pacemaker, returning for follow-up. He has deteriorated slowly over the last year. Both his cognitive deficiency and his physical abilities have declined  He continues to live in his own condominium, but his daughter lives in the adjoining [place]. She checks on him daily. He continues to perform all his usual activities of daily living and cooks for himself. On the other hand he is often confused, does not like to leave the home, and sleeps large portions of the day. He does not like to walk. He is less and less communicative and has told his daughter several times that he is "ready to go".  She has noticed that he becomes easily short of breath getting dressed or getting in and out of a chair. Intermittently he developed some ankle swelling but this resolved spontaneously. He has not had syncope. He does not appear to have orthopnea or dyspnea at rest. He has not had new acute focal neurological events  Pacemaker interrogation shows normal device function but a steady and marked increase in the burden of ventricular pacing. His device is a St. Jude Assurity implanted in 2015 that still has roughly 9 years of generator longevity. He has 47% atrial pacing and 88% ventricular pacing (a marked increase in ventricular pacing compared with roughly 40% at the time of device implantation). [place] very flat, as expected with a sedentary lifestyle. Lead parameters are  good. He has had 43 episodes of atrial mode switch but these are all less than 1 minute in duration.   Past Medical History  Diagnosis Date  . Memory loss   . OCD (obsessive compulsive disorder)   . Dysphagia     Mild  . Gastroesophageal reflux disease   . Degenerative arthritis   . Dyslipidemia   . Hearing loss   . Diverticulitis   . Left bundle branch block   . Nocturnal leg cramps   . Allergy   . Seizures ([place])   . Substance abuse   . Ulcer   . Cataract   . Anxiety   . Hypertension   . Hearing difficulty     bilateral hearing aids    Past Surgical History  Procedure Laterality Date  . Tonsillectomy    . Turp vaporization    . Cataract extraction Bilateral   . Basal cell carcinoma excision    . Prostate surgery    . Eye surgery    . Permanent pacemaker insertion N/A 01/03/2014    Procedure: PERMANENT PACEMAKER INSERTION;  Surgeon: Sanda Klein, MD;  Location: [facility];  Service: Cardiovascular;  Laterality: N/A;    Current Medications: Outpatient Prescriptions Prior to Visit  Medication Sig Dispense Refill  . aspirin 81 MG tablet Take 81 mg by mouth daily.    . cholecalciferol (VITAMIN D) 1000 UNITS tablet Take 2,000 Units by mouth daily.     Marland Kitchen donepezil (  ARICEPT) 5 MG tablet TAKE 1 TABLET (5 MG TOTAL) BY MOUTH 2 (TWO) TIMES DAILY. (Patient taking differently: Take 5 mg by mouth 2 (two) times daily. ) 180 tablet 3  . fluticasone (FLONASE) 50 MCG/ACT nasal spray Place 2 sprays into both nostrils daily. 16 g 0  . memantine (NAMENDA) 10 MG tablet TAKE 1 TABLET (10 MG TOTAL) BY MOUTH 2 (TWO) TIMES DAILY. 180 tablet 3  . mirabegron ER (MYRBETRIQ) 50 MG TB24 tablet Take 50 mg by mouth daily.    . Multiple Vitamins-Minerals (PRESERVISION AREDS) TABS Take 1 tablet by mouth daily.    Marland Kitchen olopatadine (PATANOL) 0.1 % ophthalmic solution PLACE 1 DROP INTO BOTH EYES 2 (TWO) TIMES DAILY. 5 mL 11  . omeprazole (PRILOSEC) 20 MG capsule Take 1 capsule (20 mg total) by mouth  daily. Take in morning on empty stomach. 90 capsule 3  . Polyethyl Glycol-Propyl Glycol (SYSTANE OP) Place 1 drop into both eyes 2 (two) times daily.     . sertraline (ZOLOFT) 100 MG tablet Take 1.5 tablets (150 mg total) by mouth daily. 135 tablet 3  . valACYclovir (VALTREX) 1000 MG tablet Take 2 tabs po, repeat in 12 hours, then stop 4 tablet 1   No facility-administered medications prior to visit.     Allergies:   Sanctura and Biaxin   Social History   Social History  . Marital Status: Widowed    Spouse Name: N/A  . Number of Children: N/A  . Years of Education: N/A   Occupational History  . Retired    Social History Main Topics  . Smoking status: Former Research scientist (life sciences)  . Smokeless tobacco: Never Used  . Alcohol Use: No     Comment: History of alcoholism, no longer drinking  . Drug Use: No  . Sexual Activity: No   Other Topics Concern  . None   Social History Narrative   Widow. Education: [employer].            Patient is right handed.     Family History:  The patient's family history includes Dementia in his father; Heart attack in his brother, brother, mother, sister, and sister; Heart disease in his daughter, paternal grandmother, and sister; Stroke in his brother.   ROS:   Please see the history of present illness.    ROS All other systems reviewed and are negative.   PHYSICAL EXAM:   VS:  BP 133/62 mmHg  Pulse 59  Ht 5\' 8"  (1.727 m)  Wt 73.483 kg (162 lb)  BMI 24.64 kg/m2   GEN: Well nourished, well developed, in no acute distress HEENT: normal Neck: no JVD, carotid bruits, or masses Cardiac: Paradoxically split S2, RRR; no murmurs, rubs, or gallops,no edema , healthy pacemaker site Respiratory:  clear to auscultation bilaterally, normal work of breathing GI: soft, nontender, nondistended, + BS MS: no deformity or atrophy Skin: warm and dry, no rash Neuro:  Alert and Oriented x 1, Strength and sensation are intact Psych: euthymic mood, full affect  Wt  Readings from Last 3 Encounters:  03/05/16 73.483 kg (162 lb)  02/18/16 74.163 kg (163 lb 8 oz)  02/11/16 69.854 kg (154 lb)      Studies/Labs Reviewed:   EKG:  EKG is ordered today.  The ekg ordered today demonstrates Atrial sensed ventricular paced rhythm  Recent Labs: 10/30/2015: ALT 25; BUN 19; Creatinine, Ser 1.24; Hemoglobin 13.9; Platelets 200; Potassium 4.2; Sodium 138   Lipid Panel No results found for: CHOL, TRIG, HDL, CHOLHDL,  VLDL, LDLCALC, LDLDIRECT    ASSESSMENT:    1. Shortness of breath   2. Heart block AV second degree   3. Cardiac pacemaker implanted- St Jude - 01/03/14   4. Chronic diastolic heart failure ([place])   5. Dementia, without behavioral disturbance      PLAN:  In order of problems listed above:  1. This could represent worsening heart failure 2. AV block: His AV conduction has deteriorated over time. I suspect this is due to worsening intrinsic conduction properties with age and not due to medications. She does take cholinesterase inhibitors for dementia, which may be causing some degree of sinus bradycardia but which are probably not contributing in a major way to AV block. Also, these medicines are necessary to prevent further cognitive decline. 3. PM: Normal pacemaker function. VIP is programmed on and I don't think there are any significant changes we can make to programming to reduce the burden of ventricular pacing. 4. CHF: His shortness of breath is quite likely due to heart failure, in turn possibly due to more ventricular pacing. Will check an echocardiogram. If ejection fraction has decreased, theoretically there may be an option for resynchronization therapy upgrade. However, in view of his age and cognitive decline, I doubt that he would be a good candidate for a new surgical procedure. He had serious problems with confusion and "sundowning" when he had his previous pacemaker implant. This occurred even though we did not give him any sedatives  for the procedure. 5. Dementia: I think his care should be transitioned to a palliative care approach    Medication Adjustments/Labs and Tests Ordered: Current medicines are reviewed at length with the patient today.  Concerns regarding medicines are outlined above.  Medication changes, Labs and Tests ordered today are listed in the Patient Instructions below. Patient Instructions  Medication Instructions: Dr Sallyanne Kuster recommends that you continue on your current medications as directed. Please refer to the Current Medication list given to you today.  Labwork: NONE ORDERED  Testing/Procedures: 1. Echocardiogram - Your physician has requested that you have an echocardiogram. Echocardiography is a painless test that uses sound waves to create images of your heart. It provides your doctor with information about the size and shape of your heart and how well your heart's chambers and valves are working. This procedure takes approximately one hour. There are no restrictions for this procedure. This will be done at our [facility] location - [address], [address].  Follow-up: Dr Sallyanne Kuster recommends that you schedule a follow-up appointment in 3 months with a pacemaker check.  If you need a refill on your cardiac medications before your next appointment, please call your pharmacy.    Signed, Sanda Klein, MD  03/06/2016 10:59 AM    [facility], [place], [place]  [address] Phone: [phone]; Fax: [phone]

## 2016-03-05 NOTE — Patient Instructions (Signed)
Medication Instructions: Dr Sallyanne Kuster recommends that you continue on your current medications as directed. Please refer to the Current Medication list given to you today.  Labwork: NONE ORDERED  Testing/Procedures: 1. Echocardiogram - Your physician has requested that you have an echocardiogram. Echocardiography is a painless test that uses sound waves to create images of your heart. It provides your doctor with information about the size and shape of your heart and how well your heart's chambers and valves are working. This procedure takes approximately one hour. There are no restrictions for this procedure. This will be done at our [facility] location - [address], [address].  Follow-up: Dr Sallyanne Kuster recommends that you schedule a follow-up appointment in 3 months with a pacemaker check.  If you need a refill on your cardiac medications before your next appointment, please call your pharmacy.

## 2016-03-06 DIAGNOSIS — F0390 Unspecified dementia without behavioral disturbance: Secondary | ICD-10-CM | POA: Insufficient documentation

## 2016-03-06 DIAGNOSIS — I5032 Chronic diastolic (congestive) heart failure: Secondary | ICD-10-CM | POA: Insufficient documentation

## 2016-03-16 LAB — CUP PACEART INCLINIC DEVICE CHECK
Implantable Lead Location: 753859
Implantable Lead Location: 753860
MDC IDC LEAD IMPLANT DT: 20150326
MDC IDC LEAD IMPLANT DT: 20150326
MDC IDC SESS DTM: 20170606125324
Pulse Gen Model: 2240
Pulse Gen Serial Number: 7602208

## 2016-03-22 ENCOUNTER — Encounter: Payer: Self-pay | Admitting: Cardiovascular Disease

## 2016-03-29 ENCOUNTER — Other Ambulatory Visit: Payer: Self-pay

## 2016-03-29 ENCOUNTER — Ambulatory Visit (HOSPITAL_COMMUNITY): Payer: Medicare Other | Attending: Internal Medicine

## 2016-03-29 DIAGNOSIS — E785 Hyperlipidemia, unspecified: Secondary | ICD-10-CM | POA: Diagnosis not present

## 2016-03-29 DIAGNOSIS — R0602 Shortness of breath: Secondary | ICD-10-CM | POA: Insufficient documentation

## 2016-03-29 DIAGNOSIS — R0600 Dyspnea, unspecified: Secondary | ICD-10-CM | POA: Diagnosis present

## 2016-03-29 DIAGNOSIS — I351 Nonrheumatic aortic (valve) insufficiency: Secondary | ICD-10-CM | POA: Diagnosis not present

## 2016-03-29 DIAGNOSIS — I493 Ventricular premature depolarization: Secondary | ICD-10-CM | POA: Diagnosis not present

## 2016-03-29 DIAGNOSIS — Z87891 Personal history of nicotine dependence: Secondary | ICD-10-CM | POA: Insufficient documentation

## 2016-03-29 DIAGNOSIS — Z8249 Family history of ischemic heart disease and other diseases of the circulatory system: Secondary | ICD-10-CM | POA: Diagnosis not present

## 2016-03-29 DIAGNOSIS — I509 Heart failure, unspecified: Secondary | ICD-10-CM | POA: Diagnosis not present

## 2016-03-31 ENCOUNTER — Telehealth: Payer: Self-pay | Admitting: Cardiovascular Disease

## 2016-03-31 NOTE — Telephone Encounter (Signed)
New message ° ° ° ° ° °Calling to get echo results °

## 2016-03-31 NOTE — Telephone Encounter (Signed)
Returned call to patient's daughter Mateo Flow.She stated she received father's echo results on mychart and she did not completely understand.Echo results reviewed with her.She still has a lot of questions to ask Dr.Croitoru.Advised Dr.Croitoru out of office.Stated she would like to talk to him.He can call her at # [phone].Advised he want be back until first of July.Stated she will wait for his phone call.Message sent to Dr.Croitoru.

## 2016-04-09 ENCOUNTER — Telehealth: Payer: Self-pay

## 2016-04-09 NOTE — Telephone Encounter (Signed)
Faxed requested information from [place] at [facility] to [place], [place].

## 2016-04-16 ENCOUNTER — Telehealth: Payer: Self-pay | Admitting: Cardiovascular Disease

## 2016-04-16 NOTE — Telephone Encounter (Signed)
Spoke to daughter Mateo Flow [id number]. She had some confusion about the echo results, we reviewed. Also notes recent discussion w Dr. Sallyanne Kuster regarding patient perhaps needing a replacement PM or 3rd wire. Had numerous subjective concerns related to patient's age and dementia, fitness for surgery. States she is anxious about her dad's health and would like to speak w/ Dr. Sallyanne Kuster if possible. She is aware I will route the message.

## 2016-04-16 NOTE — Telephone Encounter (Signed)
New message      Returning a call to Dr Sallyanne Kuster to get lab results.  Daughter want to talk to the doctor only if possible.

## 2016-04-20 DIAGNOSIS — H353133 Nonexudative age-related macular degeneration, bilateral, advanced atrophic without subfoveal involvement: Secondary | ICD-10-CM | POA: Diagnosis not present

## 2016-04-27 DIAGNOSIS — L570 Actinic keratosis: Secondary | ICD-10-CM | POA: Diagnosis not present

## 2016-04-27 DIAGNOSIS — L821 Other seborrheic keratosis: Secondary | ICD-10-CM | POA: Diagnosis not present

## 2016-04-27 DIAGNOSIS — Z85828 Personal history of other malignant neoplasm of skin: Secondary | ICD-10-CM | POA: Diagnosis not present

## 2016-04-30 ENCOUNTER — Other Ambulatory Visit: Payer: Self-pay | Admitting: Neurology

## 2016-04-30 NOTE — Telephone Encounter (Signed)
Dr C called patient's daughter and left detail message.

## 2016-05-22 DIAGNOSIS — L739 Follicular disorder, unspecified: Secondary | ICD-10-CM | POA: Diagnosis not present

## 2016-05-24 DIAGNOSIS — R350 Frequency of micturition: Secondary | ICD-10-CM | POA: Diagnosis not present

## 2016-05-24 DIAGNOSIS — Z6827 Body mass index (BMI) 27.0-27.9, adult: Secondary | ICD-10-CM | POA: Diagnosis not present

## 2016-05-24 DIAGNOSIS — R61 Generalized hyperhidrosis: Secondary | ICD-10-CM | POA: Diagnosis not present

## 2016-05-26 DIAGNOSIS — R7309 Other abnormal glucose: Secondary | ICD-10-CM | POA: Diagnosis not present

## 2016-06-08 ENCOUNTER — Ambulatory Visit (INDEPENDENT_AMBULATORY_CARE_PROVIDER_SITE_OTHER): Payer: Medicare Other | Admitting: Cardiovascular Disease

## 2016-06-08 ENCOUNTER — Encounter: Payer: Self-pay | Admitting: Cardiovascular Disease

## 2016-06-08 DIAGNOSIS — I441 Atrioventricular block, second degree: Secondary | ICD-10-CM

## 2016-06-08 DIAGNOSIS — Z950 Presence of cardiac pacemaker: Secondary | ICD-10-CM

## 2016-06-08 NOTE — Progress Notes (Signed)
Patient ID: Daniel [name], male   DOB: 09-Dec-1920, 82 y.o.   MRN: [id number]    Cardiology Office Note    Date:  06/09/2016   ID:  Daniel [name], DOB 1920-11-25, MRN [id number]  PCP:  Phineas Inches, MD  Cardiologist:   Sanda Klein, MD   Chief Complaint  Patient presents with  . Follow-up    History of Present Illness:  Daniel [name] is a 82 y.o. male with high-grade second-degree AV block and a dual-chamber permanent pacemaker, returning for follow-up. He has deteriorated slowly over the last year. Both his cognitive deficiency and his physical abilities have declined. Poor memory and urinary incontinence are worsening issues.  His echocardiogram has showed no decline in left ventricular systolic function, despite a gradual increase in the frequency of ventricular pacing. LVEF remains normal and there is no overt evidence of elevated filling pressures. There was some limitation in evaluation of diastolic function, but annular tissue Doppler velocities are actually not bad for a gentleman in his 90s.  His dyspnea generally occurs when he bends over to put on shoes or get out of a chair, not when he is walking.  Pacemaker interrogation shows normal device function but a steady and marked increase in the burden of ventricular pacing. His device is a St. Jude Assurity implanted in 2015 that still has roughly 9 years of generator longevity. He has 47% atrial pacing and 88% ventricular pacing (a marked increase in ventricular pacing compared with roughly 40% at the time of device implantation). Heart rate histograms are very flat, as expected with a sedentary lifestyle. Lead parameters are good.   Past Medical History:  Diagnosis Date  . Allergy   . Anxiety   . Cataract   . Degenerative arthritis   . Diverticulitis   . Dyslipidemia   . Dysphagia    Mild  . Gastroesophageal reflux disease   . Hearing difficulty    bilateral hearing aids  . Hearing loss   . Hypertension   . Left  bundle branch block   . Memory loss   . Nocturnal leg cramps   . OCD (obsessive compulsive disorder)   . Seizures ([place])   . Substance abuse   . Ulcer     Past Surgical History:  Procedure Laterality Date  . BASAL CELL CARCINOMA EXCISION    . CATARACT EXTRACTION Bilateral   . EYE SURGERY    . PERMANENT PACEMAKER INSERTION N/A 01/03/2014   Procedure: PERMANENT PACEMAKER INSERTION;  Surgeon: Sanda Klein, MD;  Location: [facility] CATH [facility];  Service: Cardiovascular;  Laterality: N/A;  . PROSTATE SURGERY    . TONSILLECTOMY    . TURP VAPORIZATION      Current Medications: Outpatient Medications Prior to Visit  Medication Sig Dispense Refill  . aspirin 81 MG tablet Take 81 mg by mouth daily.    . cholecalciferol (VITAMIN D) 1000 UNITS tablet Take 2,000 Units by mouth daily.     Marland Kitchen donepezil (ARICEPT) 5 MG tablet TAKE 1 TABLET (5 MG TOTAL) BY MOUTH 2 (TWO) TIMES DAILY. 60 tablet 2  . fluticasone (FLONASE) 50 MCG/ACT nasal spray Place 2 sprays into both nostrils daily. 16 g 0  . memantine (NAMENDA) 10 MG tablet TAKE 1 TABLET (10 MG TOTAL) BY MOUTH 2 (TWO) TIMES DAILY. 180 tablet 3  . mirabegron ER (MYRBETRIQ) 50 MG TB24 tablet Take 50 mg by mouth daily.    . Multiple Vitamins-Minerals (PRESERVISION AREDS) TABS Take 1 tablet by mouth daily.    Marland Kitchen  olopatadine (PATANOL) 0.1 % ophthalmic solution PLACE 1 DROP INTO BOTH EYES 2 (TWO) TIMES DAILY. 5 mL 11  . omeprazole (PRILOSEC) 20 MG capsule Take 1 capsule (20 mg total) by mouth daily. Take in morning on empty stomach. 90 capsule 3  . Polyethyl Glycol-Propyl Glycol (SYSTANE OP) Place 1 drop into both eyes 2 (two) times daily.     . sertraline (ZOLOFT) 100 MG tablet Take 1.5 tablets (150 mg total) by mouth daily. 135 tablet 3   No facility-administered medications prior to visit.      Allergies:   Sanctura [trospium] and Biaxin [clarithromycin]   Social History   Social History  . Marital status: Widowed    Spouse name: N/A  . Number of  children: N/A  . Years of education: N/A   Occupational History  . Retired    Social History Main Topics  . Smoking status: Former Research scientist (life sciences)  . Smokeless tobacco: Never Used  . Alcohol use No     Comment: History of alcoholism, no longer drinking  . Drug use: No  . Sexual activity: No   Other Topics Concern  . None   Social History Narrative   Widow. Education: [employer].            Patient is right handed.     Family History:  The patient's family history includes Dementia in his father; Heart attack in his brother, brother, mother, sister, and sister; Heart disease in his daughter, paternal grandmother, and sister; Stroke in his brother.   ROS:   Please see the history of present illness.    ROS All other systems reviewed and are negative.   PHYSICAL EXAM:   VS:  BP (!) 114/54 (BP Location: Left Arm, Patient Position: Sitting, Cuff Size: Normal)   Pulse 60   Ht 5\' 8"  (1.727 m)   Wt 160 lb (72.6 kg)   SpO2 96%   BMI 24.33 kg/m    GEN: Well nourished, well developed, in no acute distress  HEENT: normal  Neck: no JVD, carotid bruits, or masses Cardiac: Paradoxically split S2, RRR; no murmurs, rubs, or gallops,no edema , healthy pacemaker site Respiratory:  clear to auscultation bilaterally, normal work of breathing GI: soft, nontender, nondistended, + BS MS: no deformity or atrophy  Skin: warm and dry, no rash Neuro:  Alert and Oriented x 1, Strength and sensation are intact Psych: euthymic mood, full affect  Wt Readings from Last 3 Encounters:  06/08/16 160 lb (72.6 kg)  03/05/16 162 lb (73.5 kg)  02/18/16 163 lb 8 oz (74.2 kg)      Studies/Labs Reviewed:   EKG:  EKG is ordered today.  The ekg ordered today demonstrates Atrial sensed ventricular paced rhythm  Recent Labs: 10/30/2015: ALT 25; BUN 19; Creatinine, Ser 1.24; Hemoglobin 13.9; Platelets 200; Potassium 4.2; Sodium 138   Lipid Panel No results found for: CHOL, TRIG, HDL, CHOLHDL, VLDL, LDLCALC,  LDLDIRECT    ASSESSMENT:    1. Cardiac pacemaker   2. Heart block AV second degree      PLAN:  In order of problems listed above: 1. PM: Normal pacemaker function.  I don't think there are any significant changes we can make to programming to reduce the burden of ventricular pacing. 2. CHF: No evidence of significant systolic; only marginal evidence of diastolic dysfunction by echo, .clear increase in filling pressures. 3. Dementia: This appears to clearly be his biggest problem and limits the benefit of aggressive intervention.    Medication  Adjustments/Labs and Tests Ordered: Current medicines are reviewed at length with the patient today.  Concerns regarding medicines are outlined above.  Medication changes, Labs and Tests ordered today are listed in the Patient Instructions below. Patient Instructions  Dr Sallyanne Kuster recommends that you continue on your current medications as directed. Please refer to the Current Medication list given to you today.  Remote monitoring is used to monitor your Pacemaker of ICD from home. This monitoring reduces the number of office visits required to check your device to one time per year. It allows [place] to keep an eye on the functioning of your device to ensure it is working properly. You are scheduled for a device check from home on Tuesday, November 28th, 2017. You may send your transmission at any time that day. If you have a wireless device, the transmission will be sent automatically. After your physician reviews your transmission, you will receive a postcard with your next transmission date.  Dr Sallyanne Kuster recommends that you schedule a follow-up appointment in 12 months with a pacemaker check. You will receive a reminder letter in the mail two months in advance. If you don't receive a letter, please call our office to schedule the follow-up appointment.  If you need a refill on your cardiac medications before your next appointment, please call your  pharmacy.    Signed, Sanda Klein, MD  06/09/2016 1:46 PM    [facility] [place], [place], [place]  [address] Phone: [phone]; Fax: [phone]

## 2016-06-08 NOTE — Patient Instructions (Signed)
Dr Sallyanne Kuster recommends that you continue on your current medications as directed. Please refer to the Current Medication list given to you today.  Remote monitoring is used to monitor your Pacemaker of ICD from home. This monitoring reduces the number of office visits required to check your device to one time per year. It allows [place] to keep an eye on the functioning of your device to ensure it is working properly. You are scheduled for a device check from home on Tuesday, November 28th, 2017. You may send your transmission at any time that day. If you have a wireless device, the transmission will be sent automatically. After your physician reviews your transmission, you will receive a postcard with your next transmission date.  Dr Sallyanne Kuster recommends that you schedule a follow-up appointment in 12 months with a pacemaker check. You will receive a reminder letter in the mail two months in advance. If you don't receive a letter, please call our office to schedule the follow-up appointment.  If you need a refill on your cardiac medications before your next appointment, please call your pharmacy.

## 2016-06-09 ENCOUNTER — Ambulatory Visit (INDEPENDENT_AMBULATORY_CARE_PROVIDER_SITE_OTHER): Payer: Medicare Other | Admitting: Podiatry

## 2016-06-09 ENCOUNTER — Encounter: Payer: Self-pay | Admitting: Podiatry

## 2016-06-09 DIAGNOSIS — Q828 Other specified congenital malformations of skin: Secondary | ICD-10-CM | POA: Diagnosis not present

## 2016-06-09 DIAGNOSIS — M79673 Pain in unspecified foot: Secondary | ICD-10-CM | POA: Diagnosis not present

## 2016-06-09 DIAGNOSIS — B351 Tinea unguium: Secondary | ICD-10-CM

## 2016-06-09 DIAGNOSIS — M79609 Pain in unspecified limb: Principal | ICD-10-CM

## 2016-06-09 NOTE — Progress Notes (Signed)
Subjective:     Patient ID: Daniel [name], male   DOB: Oct 11, 1921, 82 y.o.   MRN: [id number]  HPI this patient presents to the office with chief complaint of long thick nails. He also has a history of the skin lesion on the outside of the right foot.   No evidence of redness, swelling or infection noted on the outside of the right foot. Patient has thick disfigured discolored toenails which are painful walking and wearing his shoes. He presents for preventative foot care services and  evaluation of his skin lesion.   Review of Systems     Objective:   Physical Exam GENERAL APPEARANCE: Alert, conversant. Appropriately groomed. No acute distress.  VASCULAR: Pedal pulses palpable at  [facility] and PT bilateral.  Capillary refill time is immediate to all digits,  Normal temperature gradient.  Digital hair growth is present bilateral  NEUROLOGIC: sensation is normal to 5.07 monofilament at 5/5 sites bilateral.  Light touch is intact bilateral, Muscle strength normal.  MUSCULOSKELETAL: acceptable muscle strength, tone and stability bilateral.  Intrinsic muscluature intact bilateral.  Rectus appearance of foot and digits noted bilateral.   DERMATOLOGIC: skin color, texture, and turgor are within normal limits.  No preulcerative lesions or ulcers  are seen, no interdigital maceration noted.  No open lesions present.  .Porokeratosis at 5th metabase right foot.  NAILS  Thick disfigured discolored nails all toes both feet.      Assessment:     Onychomycosis   Porokeratosis right foot    Plan:     IE  Debridement of nails. Debridement of porokeratosis right foot.  RTC 10 weeks.  Gardiner Barefoot DPM

## 2016-07-13 DIAGNOSIS — Z23 Encounter for immunization: Secondary | ICD-10-CM | POA: Diagnosis not present

## 2016-07-13 DIAGNOSIS — Z6825 Body mass index (BMI) 25.0-25.9, adult: Secondary | ICD-10-CM | POA: Diagnosis not present

## 2016-07-13 DIAGNOSIS — R7309 Other abnormal glucose: Secondary | ICD-10-CM | POA: Diagnosis not present

## 2016-07-13 DIAGNOSIS — G301 Alzheimer's disease with late onset: Secondary | ICD-10-CM | POA: Diagnosis not present

## 2016-07-13 DIAGNOSIS — Z0000 Encounter for general adult medical examination without abnormal findings: Secondary | ICD-10-CM | POA: Diagnosis not present

## 2016-07-13 DIAGNOSIS — H6123 Impacted cerumen, bilateral: Secondary | ICD-10-CM | POA: Diagnosis not present

## 2016-07-13 DIAGNOSIS — E785 Hyperlipidemia, unspecified: Secondary | ICD-10-CM | POA: Diagnosis not present

## 2016-07-13 DIAGNOSIS — F0280 Dementia in other diseases classified elsewhere without behavioral disturbance: Secondary | ICD-10-CM | POA: Diagnosis not present

## 2016-07-13 LAB — CUP PACEART INCLINIC DEVICE CHECK
Battery Remaining Longevity: 105 mo
Date Time Interrogation Session: 20171003085921
Implantable Lead Location: 753860
MDC IDC LEAD IMPLANT DT: 20150326
MDC IDC LEAD IMPLANT DT: 20150326
MDC IDC LEAD LOCATION: 753859
MDC IDC PG SERIAL: 7602208
Pulse Gen Model: 2240

## 2016-07-14 ENCOUNTER — Encounter: Payer: Self-pay | Admitting: Cardiovascular Disease

## 2016-07-20 ENCOUNTER — Telehealth: Payer: Self-pay | Admitting: Cardiovascular Disease

## 2016-07-20 NOTE — Telephone Encounter (Signed)
Left msg to call.

## 2016-07-20 NOTE — Telephone Encounter (Signed)
The "circles" around the A paced and V paced percentages do not mean anything. Just a way to draw our attention to important parameters. Yes, his need for ventricular pacing has steadily increased over the years. This is expected with aging. In most patients second degree AV block (intermittent blocking of electrical signal) gradually worsens with time, eventually becoming third degree (complete) heart block. This does not mean the heart is worsening, just that he is aging.

## 2016-07-20 NOTE — Telephone Encounter (Signed)
New Message  Pts daughter voiced calling about results and also voiced something was circled.  Please f/u with pt

## 2016-07-20 NOTE — Telephone Encounter (Signed)
Recommendations communicated to caller, who voiced understanding and thanks.

## 2016-07-20 NOTE — Telephone Encounter (Signed)
Received return call from daughter.   notes new download report from 10/4 (scanned document), there is an AP and VP circled - she wanted to know if there was relevance to this and if his PM burden had changed from last visit. (she states she's concerned about how much the PM is "kicking in" now that he has 3rd degree block.)  Would appreciate Dr. Victorino December summary on this.  She is aware I will follow up w her w findings.

## 2016-08-18 ENCOUNTER — Ambulatory Visit (INDEPENDENT_AMBULATORY_CARE_PROVIDER_SITE_OTHER): Payer: Medicare Other | Admitting: Podiatry

## 2016-08-18 ENCOUNTER — Ambulatory Visit: Payer: Medicare Other | Admitting: Neurology

## 2016-08-18 ENCOUNTER — Encounter: Payer: Self-pay | Admitting: Podiatry

## 2016-08-18 VITALS — Ht 68.0 in | Wt 160.0 lb

## 2016-08-18 DIAGNOSIS — Q828 Other specified congenital malformations of skin: Secondary | ICD-10-CM | POA: Diagnosis not present

## 2016-08-18 DIAGNOSIS — M79609 Pain in unspecified limb: Secondary | ICD-10-CM | POA: Diagnosis not present

## 2016-08-18 DIAGNOSIS — B351 Tinea unguium: Secondary | ICD-10-CM | POA: Diagnosis not present

## 2016-08-18 NOTE — Progress Notes (Signed)
Subjective:     Patient ID: Daniel [name], male   DOB: May 30, 1921, 82 y.o.   MRN: [id number]  HPI this patient presents to the office with chief complaint of long thick nails. He also has a history of the skin lesion on the outside of the right foot.   No evidence of redness, swelling or infection noted on the outside of the right foot. Patient has thick disfigured discolored toenails which are painful walking and wearing his shoes. He presents for preventative foot care services and  evaluation of his skin lesion.   Review of Systems     Objective:   Physical Exam GENERAL APPEARANCE: Alert, conversant. Appropriately groomed. No acute distress.  VASCULAR: Pedal pulses palpable at  [facility] and PT bilateral.  Capillary refill time is immediate to all digits,  Normal temperature gradient.  Digital hair growth is present bilateral  NEUROLOGIC: sensation is normal to 5.07 monofilament at 5/5 sites bilateral.  Light touch is intact bilateral, Muscle strength normal.  MUSCULOSKELETAL: acceptable muscle strength, tone and stability bilateral.  Intrinsic muscluature intact bilateral.  Rectus appearance of foot and digits noted bilateral.   DERMATOLOGIC: skin color, texture, and turgor are within normal limits.  No preulcerative lesions or ulcers  are seen, no interdigital maceration noted.  No open lesions present.  .Porokeratosis at 5th metabase right foot.  NAILS  Thick disfigured discolored nails all toes both feet.      Assessment:     Onychomycosis   Porokeratosis right foot    Plan:     IE  Debridement of nails. Debridement of porokeratosis right foot.  RTC 10 weeks.  Gardiner Barefoot DPM

## 2016-08-24 ENCOUNTER — Ambulatory Visit: Payer: Medicare Other | Admitting: Neurology

## 2016-08-24 ENCOUNTER — Ambulatory Visit: Payer: Medicare Other | Admitting: Adult Health

## 2016-08-24 ENCOUNTER — Ambulatory Visit (INDEPENDENT_AMBULATORY_CARE_PROVIDER_SITE_OTHER): Payer: Medicare Other | Admitting: Neurology

## 2016-08-24 ENCOUNTER — Encounter: Payer: Self-pay | Admitting: Neurology

## 2016-08-24 VITALS — BP 125/62 | HR 59 | Ht 68.0 in | Wt 157.0 lb

## 2016-08-24 DIAGNOSIS — G301 Alzheimer's disease with late onset: Secondary | ICD-10-CM

## 2016-08-24 DIAGNOSIS — F0280 Dementia in other diseases classified elsewhere without behavioral disturbance: Secondary | ICD-10-CM

## 2016-08-24 MED ORDER — DONEPEZIL HCL 5 MG PO TABS: ORAL_TABLET | ORAL | 3 refills | Status: DC

## 2016-08-24 NOTE — Progress Notes (Signed)
Reason for visit: Alzheimer's disease  Daniel [name] is an 82 y.o. male  History of present illness:  Daniel [name] is a 82 year old right-handed white male with a history of a progressive memory disorder. The patient lives at home, his daughter lives next door to him and the patient gets supervision throughout the day while he is awake. The patient can fix breakfast for himself, he will heat lunch and dinner in the microwave. The patient is able to bathe and dress himself, and he can manage the kitchen. He does not operate a motor vehicle. He is on Aricept and Namenda, he tolerates these medications well. He needs supervision for the medications. The patient has had no problems with wandering or other safety issues. The family indicates that he is requiring increasing supervision. The patient returns for an evaluation.  Past Medical History:  Diagnosis Date  . Allergy   . Anxiety   . Cataract   . Degenerative arthritis   . Diverticulitis   . Dyslipidemia   . Dysphagia    Mild  . Gastroesophageal reflux disease   . Hearing difficulty    bilateral hearing aids  . Hearing loss   . Hypertension   . Left bundle branch block   . Memory loss   . Nocturnal leg cramps   . OCD (obsessive compulsive disorder)   . Seizures ([place])   . Substance abuse   . Ulcer [facility])     Past Surgical History:  Procedure Laterality Date  . BASAL CELL CARCINOMA EXCISION    . CATARACT EXTRACTION Bilateral   . EYE SURGERY    . PERMANENT PACEMAKER INSERTION N/A 01/03/2014   Procedure: PERMANENT PACEMAKER INSERTION;  Surgeon: Sanda Klein, MD;  Location: [place] CATH LAB;  Service: Cardiovascular;  Laterality: N/A;  . PROSTATE SURGERY    . TONSILLECTOMY    . TURP VAPORIZATION      Family History  Problem Relation Age of Onset  . Heart attack Mother   . Dementia Father   . Heart attack Sister   . Heart disease Sister   . Heart attack Brother   . Heart attack Sister   . Heart attack Brother   . Stroke  Brother   . Heart disease Daughter   . Heart disease Paternal Grandmother     Social history:  reports that he has quit smoking. He has never used smokeless tobacco. He reports that he does not drink alcohol or use drugs.    Allergies  Allergen Reactions  . Sanctura [Trospium]     Seizures, body spasms  . Biaxin [Clarithromycin] Hives    Medications:  Prior to Admission medications   Medication Sig Start Date End Date Taking? Authorizing Provider  aspirin 81 MG tablet Take 81 mg by mouth daily.   Yes Historical Provider, MD  cholecalciferol (VITAMIN D) 1000 UNITS tablet Take 2,000 Units by mouth daily.    Yes Historical Provider, MD  donepezil (ARICEPT) 5 MG tablet TAKE 1 TABLET (5 MG TOTAL) BY MOUTH 2 (TWO) TIMES DAILY. 05/03/16  Yes Kathrynn Ducking, MD  fluticasone [facility]) 50 MCG/ACT nasal spray Place 2 sprays into both nostrils daily. 02/17/16  Yes Leandrew Koyanagi, MD  memantine (NAMENDA) 10 MG tablet TAKE 1 TABLET (10 MG TOTAL) BY MOUTH 2 (TWO) TIMES DAILY. 01/16/16  Yes Kathrynn Ducking, MD  mirabegron ER (MYRBETRIQ) 50 MG TB24 tablet Take 50 mg by mouth daily.   Yes Historical Provider, MD  Multiple Vitamins-Minerals (PRESERVISION AREDS)  TABS Take 1 tablet by mouth daily.   Yes Historical Provider, MD  olopatadine (PATANOL) 0.1 % ophthalmic solution PLACE 1 DROP INTO BOTH EYES 2 (TWO) TIMES DAILY. 09/17/15  Yes Leandrew Koyanagi, MD  omeprazole (PRILOSEC) 20 MG capsule Take 1 capsule (20 mg total) by mouth daily. Take in morning on empty stomach. 10/08/15  Yes Leandrew Koyanagi, MD  Polyethyl Glycol-Propyl Glycol (SYSTANE OP) Place 1 drop into both eyes 2 (two) times daily.    Yes Historical Provider, MD  sertraline (ZOLOFT) 100 MG tablet Take 1.5 tablets (150 mg total) by mouth daily. 09/03/15  Yes Leandrew Koyanagi, MD    ROS:  Out of a complete 14 system review of symptoms, the patient complains only of the following symptoms, and all other reviewed systems are  negative.  Hearing loss Daytime sleepiness Incontinence of the bladder Joint pain, joint swelling, back pain Itching Memory loss, speech difficulty Decreased concentration, confusion  Blood pressure 125/62, pulse (!) 59, height 5\' 8"  (1.727 m), weight 157 lb (71.2 kg).  Physical Exam  General: The patient is alert and cooperative at the time of the examination.  Skin: No significant peripheral edema is noted.   Neurologic Exam  Mental status: The patient is alert and oriented x 1 at the time of the examination (not oriented to place or date). The Mini-Mental Status Examination done today shows a total score of 10/30.   Cranial nerves: Facial symmetry is present. Speech is normal, no aphasia or dysarthria is noted. Extraocular movements are full. Visual fields are full.  Motor: The patient has good strength in all 4 extremities.  Sensory examination: Soft touch sensation is symmetric on the face, arms, and legs.  Coordination: The patient has good finger-nose-finger and heel-to-shin bilaterally.  Gait and station: The patient has a normal gait. Tandem gait is normal. Romberg is negative. No drift is seen.  Reflexes: Deep tendon reflexes are symmetric.   Assessment/Plan:  1. Alzheimer's disease  The patient continues to progress. He is currently living at home, but his family needs to plan for the future as the patient will continue to have increasing needs for supervision. The family has investigated several extended care facilities. They have looked into the [facility] as a potential site. The patient was given a perception for Aricept, he will follow-up in 6 months.  Daniel Alexanders MD 08/24/2016 2:22 PM  [facility] [address] [place], [place] [address]  Phone [phone] Fax [phone]

## 2016-09-03 DIAGNOSIS — L739 Follicular disorder, unspecified: Secondary | ICD-10-CM | POA: Diagnosis not present

## 2016-09-03 DIAGNOSIS — F0280 Dementia in other diseases classified elsewhere without behavioral disturbance: Secondary | ICD-10-CM | POA: Diagnosis not present

## 2016-09-03 DIAGNOSIS — Z6825 Body mass index (BMI) 25.0-25.9, adult: Secondary | ICD-10-CM | POA: Diagnosis not present

## 2016-09-03 DIAGNOSIS — G301 Alzheimer's disease with late onset: Secondary | ICD-10-CM | POA: Diagnosis not present

## 2016-09-07 ENCOUNTER — Ambulatory Visit (INDEPENDENT_AMBULATORY_CARE_PROVIDER_SITE_OTHER): Payer: Medicare Other | Admitting: *Deleted

## 2016-09-07 DIAGNOSIS — I441 Atrioventricular block, second degree: Secondary | ICD-10-CM | POA: Diagnosis not present

## 2016-09-08 ENCOUNTER — Telehealth: Payer: Self-pay | Admitting: Cardiovascular Disease

## 2016-09-08 DIAGNOSIS — L739 Follicular disorder, unspecified: Secondary | ICD-10-CM | POA: Diagnosis not present

## 2016-09-08 DIAGNOSIS — Z6824 Body mass index (BMI) 24.0-24.9, adult: Secondary | ICD-10-CM | POA: Diagnosis not present

## 2016-09-08 NOTE — Telephone Encounter (Signed)
[name] want to know if pt can take Claritin or Zyrtec because of his heart condition. Please advise daughter.

## 2016-09-08 NOTE — Progress Notes (Signed)
Remote pacemaker transmission.   

## 2016-09-08 NOTE — Telephone Encounter (Signed)
Any of the antihistamines (claritin, zyrtec or xyzal) would be fine and not interact with his other meds.

## 2016-09-08 NOTE — Telephone Encounter (Signed)
Returned call to daughter. She actually had questions about several medications. She's concerned about his pacemaker w med changes.  Notes that the patient has been diagnosesd w strep folliculitis.   He was placed on doxycycline but failed to improve on this. Is being prescribed Keflex.  He also had a depo shot.  Additionally, daughter wanted to know if Xyzal safe to take for antihistamine to relieve itching of skin or if he should take Claritin or Zyrtec (or other med if advised). Asking for provider recommendation.  Aware I will request advice and return her call.

## 2016-09-08 NOTE — Telephone Encounter (Signed)
All those meds should be OK with the pacemaker. Any one of the antihistamines is appropriate, but Zyrtec is more likely to cause some sedation compared to the other.

## 2016-09-09 ENCOUNTER — Encounter: Payer: Self-pay | Admitting: Cardiology

## 2016-09-09 NOTE — Telephone Encounter (Signed)
Advice reviewed w caller, who voiced understanding and thanks.

## 2016-09-14 DIAGNOSIS — L111 Transient acantholytic dermatosis [Grover]: Secondary | ICD-10-CM | POA: Diagnosis not present

## 2016-09-14 DIAGNOSIS — Z85828 Personal history of other malignant neoplasm of skin: Secondary | ICD-10-CM | POA: Diagnosis not present

## 2016-09-26 ENCOUNTER — Emergency Department (HOSPITAL_COMMUNITY): Payer: Medicare Other

## 2016-09-26 ENCOUNTER — Encounter (HOSPITAL_COMMUNITY): Payer: Self-pay

## 2016-09-26 ENCOUNTER — Emergency Department (HOSPITAL_COMMUNITY)
Admission: EM | Admit: 2016-09-26 | Discharge: 2016-09-26 | Disposition: A | Discharge status: Home / Self Care | Payer: Medicare Other | Attending: Emergency Medicine | Admitting: Emergency Medicine

## 2016-09-26 DIAGNOSIS — Z79899 Other long term (current) drug therapy: Secondary | ICD-10-CM | POA: Diagnosis not present

## 2016-09-26 DIAGNOSIS — Z7982 Long term (current) use of aspirin: Secondary | ICD-10-CM | POA: Insufficient documentation

## 2016-09-26 DIAGNOSIS — I110 Hypertensive heart disease with heart failure: Secondary | ICD-10-CM | POA: Diagnosis not present

## 2016-09-26 DIAGNOSIS — J45909 Unspecified asthma, uncomplicated: Secondary | ICD-10-CM | POA: Diagnosis not present

## 2016-09-26 DIAGNOSIS — G309 Alzheimer's disease, unspecified: Secondary | ICD-10-CM | POA: Diagnosis not present

## 2016-09-26 DIAGNOSIS — K4090 Unilateral inguinal hernia, without obstruction or gangrene, not specified as recurrent: Secondary | ICD-10-CM | POA: Diagnosis not present

## 2016-09-26 DIAGNOSIS — I5032 Chronic diastolic (congestive) heart failure: Secondary | ICD-10-CM | POA: Insufficient documentation

## 2016-09-26 DIAGNOSIS — Z950 Presence of cardiac pacemaker: Secondary | ICD-10-CM | POA: Diagnosis not present

## 2016-09-26 DIAGNOSIS — R1032 Left lower quadrant pain: Secondary | ICD-10-CM | POA: Diagnosis not present

## 2016-09-26 DIAGNOSIS — R1030 Lower abdominal pain, unspecified: Secondary | ICD-10-CM | POA: Diagnosis present

## 2016-09-26 DIAGNOSIS — Z87891 Personal history of nicotine dependence: Secondary | ICD-10-CM | POA: Diagnosis not present

## 2016-09-26 DIAGNOSIS — K5732 Diverticulitis of large intestine without perforation or abscess without bleeding: Secondary | ICD-10-CM | POA: Diagnosis not present

## 2016-09-26 LAB — URINALYSIS, ROUTINE W REFLEX MICROSCOPIC
Bilirubin Urine: NEGATIVE
GLUCOSE, UA: NEGATIVE mg/dL
HGB URINE DIPSTICK: NEGATIVE
KETONES UR: 5 mg/dL — AB
NITRITE: NEGATIVE
PH: 5 (ref 5.0–8.0)
Protein, ur: NEGATIVE mg/dL
Specific Gravity, Urine: 1.014 (ref 1.005–1.030)
Squamous Epithelial / LPF: NONE SEEN

## 2016-09-26 LAB — CBC
HCT: 39.5 % (ref 39.0–52.0)
HEMOGLOBIN: 13.4 g/dL (ref 13.0–17.0)
MCH: 32.8 pg (ref 26.0–34.0)
MCHC: 33.9 g/dL (ref 30.0–36.0)
MCV: 96.6 fL (ref 78.0–100.0)
PLATELETS: 207 10*3/uL (ref 150–400)
RBC: 4.09 MIL/uL — AB (ref 4.22–5.81)
RDW: 13.9 % (ref 11.5–15.5)
WBC: 8.2 10*3/uL (ref 4.0–10.5)

## 2016-09-26 LAB — COMPREHENSIVE METABOLIC PANEL
ALK PHOS: 75 U/L (ref 38–126)
ALT: 26 U/L (ref 17–63)
ANION GAP: 7 (ref 5–15)
AST: 32 U/L (ref 15–41)
Albumin: 4.3 g/dL (ref 3.5–5.0)
BUN: 20 mg/dL (ref 6–20)
CALCIUM: 9.2 mg/dL (ref 8.9–10.3)
CO2: 28 mmol/L (ref 22–32)
CREATININE: 1.18 mg/dL (ref 0.61–1.24)
Chloride: 105 mmol/L (ref 101–111)
GFR, EST AFRICAN AMERICAN: 59 mL/min — AB (ref >60)
GFR, EST NON AFRICAN AMERICAN: 51 mL/min — AB (ref >60)
Glucose, Bld: 76 mg/dL (ref 65–99)
Potassium: 4.2 mmol/L (ref 3.5–5.1)
SODIUM: 140 mmol/L (ref 135–145)
TOTAL PROTEIN: 7 g/dL (ref 6.5–8.1)
Total Bilirubin: 0.9 mg/dL (ref 0.3–1.2)

## 2016-09-26 LAB — LIPASE, BLOOD: Lipase: 59 U/L — ABNORMAL HIGH (ref 11–51)

## 2016-09-26 MED ORDER — IOPAMIDOL (ISOVUE-300) INJECTION 61%
100.0000 mL | Freq: Once | INTRAVENOUS | Status: AC | PRN
  Administered 2016-09-26: 100 mL via INTRAVENOUS

## 2016-09-26 MED ORDER — AMOXICILLIN-POT CLAVULANATE 875-125 MG PO TABS: 1.0000 | ORAL_TABLET | Freq: Two times a day (BID) | ORAL | 0 refills | Status: DC

## 2016-09-26 MED ORDER — SODIUM CHLORIDE 0.9 % IV BOLUS (SEPSIS)
500.0000 mL | Freq: Once | INTRAVENOUS | Status: AC
  Administered 2016-09-26: 500 mL via INTRAVENOUS

## 2016-09-26 NOTE — ED Notes (Signed)
Patient transported to CT 

## 2016-09-26 NOTE — ED Notes (Signed)
No respiratory or acute distress noted alert and talking visitors at bedside call light in reach awaiting CT results.

## 2016-09-26 NOTE — ED Triage Notes (Signed)
PER THE DAUGHTER, THE PT C/O ABDOMINAL PAIN. PT HAS A HX OF DIVERTICULITIS AND DEMENTIA. THE DAUGHTER STS THEY LIVE SIDE BY SIDE AND IS UNAWARE IF HE HAD A BM TODAY, SINCE HE HAS TO BE REMINDED TO GO. SHE DENIES N/V/D, OR FEVER.

## 2016-09-26 NOTE — ED Provider Notes (Signed)
[facility] DEPT Provider Note   CSN: [id number] Arrival date & time: 09/26/16  1606     History   Chief Complaint Chief Complaint  Patient presents with  . Abdominal Pain    HPI Daniel [name] is a 82 y.o. male.  The history is provided by the patient and a relative. No language interpreter was used.  Abdominal Pain     Daniel [name] is a 82 y.o. male who presents to the Emergency Department complaining of abdominal pain.  History is limited due to dementia. History is provided by the patient and his daughter. Over the last week he is reported intermittent lower abdominal pain, left greater than right. No reports of fever, nausea, vomiting, diarrhea. He does have patient, last bowel movement is unknown. Past Medical History:  Diagnosis Date  . Allergy   . Anxiety   . Cataract   . Degenerative arthritis   . Diverticulitis   . Diverticulitis   . Dyslipidemia   . Dysphagia    Mild  . Gastroesophageal reflux disease   . Hearing difficulty    bilateral hearing aids  . Hearing loss   . Hypertension   . Left bundle branch block   . Memory loss   . Nocturnal leg cramps   . OCD (obsessive compulsive disorder)   . Seizures ([place])   . Substance abuse   . Ulcer [facility])     Patient Active Problem List   Diagnosis Date Noted  . Chronic diastolic heart failure ([place]) 03/06/2016  . Dementia 03/06/2016  . GAD (generalized anxiety disorder) 09/04/2015  . Syncope and collapse 01/04/2014  . Cardiac pacemaker implanted- [employer] - 01/03/14 01/04/2014  . Acute diastolic heart failure ([place]) 01/04/2014  . Asthma 01/02/2014  . Heart block 01/02/2014  . Heart block AV second degree 01/02/2014  . BPH (benign prostatic hyperplasia) 01/24/2013  . GERD (gastroesophageal reflux disease) 01/24/2013  . HTN (hypertension) 01/24/2013  . Other and unspecified hyperlipidemia 01/24/2013  . Diverticular disease of colon 01/24/2013  . Alzheimer's disease 01/03/2013    Past Surgical  History:  Procedure Laterality Date  . BASAL CELL CARCINOMA EXCISION    . CATARACT EXTRACTION Bilateral   . EYE SURGERY    . PERMANENT PACEMAKER INSERTION N/A 01/03/2014   Procedure: PERMANENT PACEMAKER INSERTION;  Surgeon: Sanda Klein, MD;  Location: [place] CATH LAB;  Service: Cardiovascular;  Laterality: N/A;  . PROSTATE SURGERY    . TONSILLECTOMY    . TURP VAPORIZATION         Home Medications    Prior to Admission medications   Medication Sig Start Date End Date Taking? Authorizing Provider  aspirin 81 MG tablet Take 81 mg by mouth daily.   Yes Historical Provider, MD  cholecalciferol (VITAMIN D) 1000 UNITS tablet Take 2,000 Units by mouth daily.    Yes Historical Provider, MD  donepezil (ARICEPT) 5 MG tablet TAKE 1 TABLET (5 MG TOTAL) BY MOUTH 2 (TWO) TIMES DAILY. 08/24/16  Yes Kathrynn Ducking, MD  fluticasone [facility]) 50 MCG/ACT nasal spray Place 2 sprays into both nostrils daily. 02/17/16  Yes Leandrew Koyanagi, MD  memantine (NAMENDA) 10 MG tablet TAKE 1 TABLET (10 MG TOTAL) BY MOUTH 2 (TWO) TIMES DAILY. 01/16/16  Yes Kathrynn Ducking, MD  mirabegron ER (MYRBETRIQ) 50 MG TB24 tablet Take 50 mg by mouth daily.   Yes Historical Provider, MD  Multiple Vitamins-Minerals (PRESERVISION AREDS) TABS Take 1 tablet by mouth daily.   Yes Historical Provider, MD  olopatadine (PATANOL) 0.1 % ophthalmic solution PLACE 1 DROP INTO BOTH EYES 2 (TWO) TIMES DAILY. 09/17/15  Yes Leandrew Koyanagi, MD  omeprazole (PRILOSEC) 20 MG capsule Take 1 capsule (20 mg total) by mouth daily. Take in morning on empty stomach. 10/08/15  Yes Leandrew Koyanagi, MD  sertraline (ZOLOFT) 100 MG tablet Take 1.5 tablets (150 mg total) by mouth daily. 09/03/15  Yes Leandrew Koyanagi, MD  triamcinolone cream (KENALOG) 0.1 % Apply 1 application topically as needed. Apply to affected area x 14 days 09/14/16  Yes Historical Provider, MD  amoxicillin-clavulanate (AUGMENTIN) 875-125 MG tablet Take 1 tablet by mouth every 12  (twelve) hours. 09/26/16   Quintella Reichert, MD    Family History Family History  Problem Relation Age of Onset  . Heart attack Mother   . Dementia Father   . Heart attack Sister   . Heart disease Sister   . Heart attack Brother   . Heart attack Sister   . Heart attack Brother   . Stroke Brother   . Heart disease Daughter   . Heart disease Paternal Grandmother     Social History Social History  Substance Use Topics  . Smoking status: Former Research scientist (life sciences)  . Smokeless tobacco: Never Used  . Alcohol use No     Comment: History of alcoholism, no longer drinking     Allergies   Sanctura [trospium] and Biaxin [clarithromycin]   Review of Systems Review of Systems  Gastrointestinal: Positive for abdominal pain.  All other systems reviewed and are negative.    Physical Exam Updated Vital Signs BP 122/55 (BP Location: Left Arm)   Pulse 60   Temp 98 F (36.7 C) (Oral)   Resp 16   Ht 5\' 8"  (1.727 m)   Wt 150 lb (68 kg)   SpO2 100%   BMI 22.81 kg/m   Physical Exam  Constitutional: He appears well-developed and well-nourished.  HENT:  Head: Normocephalic and atraumatic.  Cardiovascular: Normal rate and regular rhythm.   No murmur heard. Pulmonary/Chest: Effort normal and breath sounds normal. No respiratory distress.  Abdominal: Soft.  Left inguinal hernia that is easily reducible on examination. Moderate left lower quadrant abdominal tenderness no guarding, no rebound.  Musculoskeletal: He exhibits no edema or tenderness.  Neurological: He is alert.  Mildly confused  Skin: Skin is warm and dry.  Psychiatric: He has a normal mood and affect. His behavior is normal.  Nursing note and vitals reviewed.    ED Treatments / Results  Labs (all labs ordered are listed, but only abnormal results are displayed) Labs Reviewed  LIPASE, BLOOD - Abnormal; Notable for the following:       Result Value   Lipase 59 (*)    All other components within normal limits    COMPREHENSIVE METABOLIC PANEL - Abnormal; Notable for the following:    GFR calc non Af Amer 51 (*)    GFR calc Af Amer 59 (*)    All other components within normal limits  CBC - Abnormal; Notable for the following:    RBC 4.09 (*)    All other components within normal limits  URINALYSIS, ROUTINE W REFLEX MICROSCOPIC - Abnormal; Notable for the following:    Ketones, ur 5 (*)    Leukocytes, UA SMALL (*)    Bacteria, UA RARE (*)    All other components within normal limits    EKG  EKG Interpretation None       Radiology Ct Abdomen Pelvis W  Contrast  Result Date: 09/26/2016 CLINICAL DATA:  Abdominal pain with diverticulitis EXAM: CT ABDOMEN AND PELVIS WITH CONTRAST TECHNIQUE: Multidetector CT imaging of the abdomen and pelvis was performed using the standard protocol following bolus administration of intravenous contrast. CONTRAST:  16mL ISOVUE-300 IOPAMIDOL (ISOVUE-300) INJECTION 61% COMPARISON:  CT abdomen pelvis 10/30/2015 FINDINGS: Lower chest: No pulmonary nodules. No visible pleural or pericardial effusion. Hepatobiliary: Normal hepatic size and contours without focal liver lesion. No perihepatic ascites. No intra- or extrahepatic biliary dilatation. Normal gallbladder. Pancreas: Normal pancreatic contours and enhancement. No peripancreatic fluid collection or pancreatic ductal dilatation. Spleen: Normal. Adrenals/Urinary Tract: Normal adrenal glands. No hydronephrosis or solid renal mass. Stomach/Bowel: No abnormal bowel dilatation. No bowel wall thickening or adjacent fat stranding to indicate acute inflammation. No abdominal fluid collection. There is rectosigmoid diverticulosis without acute inflammation. There is a small bowel containing left inguinal hernia without evidence of obstruction. Vascular/Lymphatic: There is atherosclerotic calcification of the non aneurysmal abdominal aorta. No abdominal or pelvic adenopathy. Reproductive: Prostate and seminal vesicles are normal. No  free fluid in the pelvis. Musculoskeletal: Multilevel lumbar facet arthrosis. No lytic or blastic lesions. No bony spinal canal stenosis. Normal visualized extrathoracic and extraperitoneal soft tissues. Other: None. IMPRESSION: 1. Rectosigmoid diverticulosis without evidence of acute inflammation. 2. Small bowel containing left inguinal hernia without evidence of proximal obstruction. 3. Aortic atherosclerosis. Electronically Signed   By: Ulyses Jarred M.D.   On: 09/26/2016 19:31    Procedures Procedures (including critical care time)  Medications Ordered in ED Medications  sodium chloride 0.9 % bolus 500 mL (0 mLs Intravenous Stopped 09/26/16 1804)  iopamidol (ISOVUE-300) 61 % injection 100 mL (100 mLs Intravenous Contrast Given 09/26/16 1902)     Initial Impression / Assessment and Plan / ED Course  I have reviewed the triage vital signs and the nursing notes.  Pertinent labs & imaging results that were available during my care of the patient were reviewed by me and considered in my medical decision making (see chart for details).  Clinical Course   Patient here for evaluation of lower abdominal pain. He does have dementia which limits history and examination. He does have lower abdominal tenderness on examination with a soft reducible left inguinal hernia with no overlying cellulitis or erythema. Labs are at his baseline. CT scan with no evidence of diverticulitis or obstruction. His daughter does state that this is similar to prior flares of diverticulitis, we will treat empirically for possible early diverticulitis with Augmentin. Discussed home care for hernia as well as abdominal pain. Discussed PCP and surgery follow-up as well as close return precautions for any worsening symptoms.   Final Clinical Impressions(s) / ED Diagnoses   Final diagnoses:  Lower abdominal pain  Unilateral inguinal hernia without obstruction or gangrene, recurrence not specified    New Prescriptions New  Prescriptions   AMOXICILLIN-CLAVULANATE (AUGMENTIN) 875-125 MG TABLET    Take 1 tablet by mouth every 12 (twelve) hours.     Quintella Reichert, MD 09/26/16 2011

## 2016-09-26 NOTE — ED Notes (Signed)
ED Provider at bedside. 

## 2016-10-13 ENCOUNTER — Ambulatory Visit (INDEPENDENT_AMBULATORY_CARE_PROVIDER_SITE_OTHER): Payer: Medicare Other | Admitting: Podiatry

## 2016-10-13 DIAGNOSIS — Q828 Other specified congenital malformations of skin: Secondary | ICD-10-CM | POA: Diagnosis not present

## 2016-10-13 NOTE — Progress Notes (Signed)
Subjective:     Patient ID: MAJOUR LAMM, male   DOB: [birth year], 83 y.o.   MRN: [id number]  HPI this patient presents the office today with a painful area on the outside of his right midfoot. He states that this area is painful wearing shoes. The skin lesion has become red and inflamed and painful to the touch.  He presents the office today for evaluation and treatment of this condition   Review of Systems     Objective:   Physical Exam     Physical Exam GENERAL APPEARANCE: Alert, conversant. Appropriately groomed. No acute distress.  VASCULAR: Pedal pulses palpable at [facility] and PT bilateral. Capillary refill time is immediate to all digits, Normal temperature gradient. Digital hair growth is present bilateral  NEUROLOGIC: sensation is normal to 5.07 monofilament at 5/5 sites bilateral. Light touch is intact bilateral, Muscle strength normal.  MUSCULOSKELETAL: acceptable muscle strength, tone and stability bilateral. Intrinsic muscluature intact bilateral. Rectus appearance of foot and digits noted bilateral.   DERMATOLOGIC: skin color, texture, and turgor are within normal limits. No preulcerative lesions or ulcers are seen, no interdigital maceration noted. No open lesions present. . No drainage noted.  Porokeratotic lesion lateral base fifth metabase right foot.  NAILS Thick disfigured discolored nails all toes both feet            Assessment:     Porokeratosis right foot     Plan:     Debride porokeratosis under local anesthesia.  Discussed proper footgear to wear.  RTC 4 weeeks.   Gardiner Barefoot DPM

## 2016-10-18 DIAGNOSIS — K4020 Bilateral inguinal hernia, without obstruction or gangrene, not specified as recurrent: Secondary | ICD-10-CM | POA: Diagnosis not present

## 2016-10-18 LAB — CUP PACEART REMOTE DEVICE CHECK
Battery Remaining Longevity: 107 mo
Battery Voltage: 2.99 V
Brady Statistic AP VP Percent: 40 %
Brady Statistic AS VP Percent: 44 %
Brady Statistic RA Percent Paced: 50 %
Date Time Interrogation Session: 20171128070013
Implantable Lead Implant Date: 20150326
Implantable Lead Location: 753860
Implantable Pulse Generator Implant Date: 20150326
Lead Channel Impedance Value: 450 Ohm
Lead Channel Pacing Threshold Amplitude: 0.75 V
Lead Channel Pacing Threshold Pulse Width: 0.5 ms
Lead Channel Sensing Intrinsic Amplitude: 12 mV
Lead Channel Setting Pacing Amplitude: 2 V
MDC IDC LEAD IMPLANT DT: 20150326
MDC IDC LEAD LOCATION: 753859
MDC IDC MSMT BATTERY REMAINING PERCENTAGE: 95.5 %
MDC IDC MSMT LEADCHNL RA IMPEDANCE VALUE: 480 Ohm
MDC IDC MSMT LEADCHNL RA SENSING INTR AMPL: 2.1 mV
MDC IDC MSMT LEADCHNL RV PACING THRESHOLD AMPLITUDE: 0.75 V
MDC IDC MSMT LEADCHNL RV PACING THRESHOLD PULSEWIDTH: 0.5 ms
MDC IDC SET LEADCHNL RV PACING AMPLITUDE: 2.5 V
MDC IDC SET LEADCHNL RV PACING PULSEWIDTH: 0.5 ms
MDC IDC SET LEADCHNL RV SENSING SENSITIVITY: 4 mV
MDC IDC STAT BRADY AP VS PERCENT: 9.9 %
MDC IDC STAT BRADY AS VS PERCENT: 6.1 %
MDC IDC STAT BRADY RV PERCENT PACED: 84 %
Pulse Gen Model: 2240
Pulse Gen Serial Number: 7602208

## 2016-10-20 ENCOUNTER — Telehealth: Payer: Self-pay

## 2016-10-20 MED ORDER — SERTRALINE HCL 100 MG PO TABS: 150.0000 mg | ORAL_TABLET | Freq: Every day | ORAL | 3 refills | Status: AC

## 2016-10-20 NOTE — Telephone Encounter (Signed)
Received faxed request for refills on Zoloft. Med was last increased by Dr. Laney Pastor to 150 mg daily.  Pt had OV w/ Dr. Jannifer Franklin in Nov.

## 2016-10-20 NOTE — Telephone Encounter (Signed)
A refill for Zoloft was given.

## 2016-11-10 ENCOUNTER — Ambulatory Visit: Payer: Medicare Other | Admitting: Podiatry

## 2016-11-23 DIAGNOSIS — H353133 Nonexudative age-related macular degeneration, bilateral, advanced atrophic without subfoveal involvement: Secondary | ICD-10-CM | POA: Diagnosis not present

## 2016-12-07 ENCOUNTER — Telehealth: Payer: Self-pay | Admitting: Neurology

## 2016-12-07 ENCOUNTER — Ambulatory Visit (INDEPENDENT_AMBULATORY_CARE_PROVIDER_SITE_OTHER): Payer: Medicare Other | Admitting: *Deleted

## 2016-12-07 DIAGNOSIS — G301 Alzheimer's disease with late onset: Principal | ICD-10-CM

## 2016-12-07 DIAGNOSIS — I441 Atrioventricular block, second degree: Secondary | ICD-10-CM | POA: Diagnosis not present

## 2016-12-07 DIAGNOSIS — F0280 Dementia in other diseases classified elsewhere without behavioral disturbance: Secondary | ICD-10-CM

## 2016-12-07 NOTE — Telephone Encounter (Signed)
Patient's daughter is calling stating the patient's dementia has progressed and needs to discuss getting help from Hospice.

## 2016-12-07 NOTE — Telephone Encounter (Signed)
Called daughter back. He has gotten worse very fast per daughter.  The call got disconnected, I tried calling back, went to VM. I LVM for her to call. Advised I will try calling back as well.

## 2016-12-07 NOTE — Progress Notes (Signed)
Remote pacemaker transmission.   

## 2016-12-07 NOTE — Addendum Note (Signed)
Addended by: Margette Fast on: 12/07/2016 05:43 PM   Modules accepted: Orders

## 2016-12-07 NOTE — Telephone Encounter (Signed)
I called the daughter. The patient is living with her, is having increasing problems with dementia and confusion, does not recognize the daughter or any family members, is incontinent of bowel and bladder, has altered sleep and wake cycle. The patient has declined significant since last seen in November, I will make a referral to hospice. The patient is advanced in age.

## 2016-12-07 NOTE — Telephone Encounter (Signed)
Daughter called back after call disconnected. She stated symptoms have progressed severely. He is Incontinent of both bowel and bladder. He is still living alone. She lives in a condo next to him. She is home all day every day helping take care of him. She needs help because she is not able to care for him and not getting sleep herself. He under cooks or [employer]. He is up all night long. Goes to sleep at 7pm gets up again at 8am. He is up in the middle of the night. He is confused. Does not recognize daughter or son. When he switched to generic namenda last year, she thought she saw a change in him.  He went to doctor back in November. Pt did not have UTI. She states patient does think he lives in a boarding house with other men. She has to re-orient him. She states he will ask "Did you see Jeneen Rinks today?" This is his brother who died 5 years ago.  He does not remember wife who is deceased.  He is not oriented to city, state, town, month. Cannot comprehend TV, does not watch tv because of this. Carries newspaper from room to room, but never reads it.   Pt complains back, leg, foot hurts. Pt stating he does not feel he is going to live much long. Daughter is concerned.  They do not have money to go into nursing home.   PCP said hospice referral not appropriate. Pt expected to live greater than 6 months per PCP. Daughter does not agree. She feels like he is declining rapidly.   Daughter stated they have caught him trying to put handkerchiefs in the refrigerator.  They have unplugged everything and took knobs off stove to make things more safe.   She is requesting hospice referral if appropriate. Advised they may need to come in for f/u. Will send request to Dr Jannifer Franklin. She verbalized understanding.

## 2016-12-08 ENCOUNTER — Encounter: Payer: Self-pay | Admitting: Cardiology

## 2016-12-10 LAB — CUP PACEART REMOTE DEVICE CHECK
Battery Remaining Longevity: 105 mo
Battery Remaining Percentage: 95.5 %
Battery Voltage: 2.99 V
Brady Statistic AP VS Percent: 10 %
Brady Statistic AS VS Percent: 6.3 %
Date Time Interrogation Session: 20180227070014
Implantable Lead Implant Date: 20150326
Implantable Lead Location: 753859
Implantable Pulse Generator Implant Date: 20150326
Lead Channel Impedance Value: 450 Ohm
Lead Channel Pacing Threshold Amplitude: 0.75 V
Lead Channel Pacing Threshold Amplitude: 0.75 V
Lead Channel Pacing Threshold Pulse Width: 0.5 ms
Lead Channel Sensing Intrinsic Amplitude: 2.4 mV
Lead Channel Setting Pacing Pulse Width: 0.5 ms
Lead Channel Setting Sensing Sensitivity: 4 mV
MDC IDC LEAD IMPLANT DT: 20150326
MDC IDC LEAD LOCATION: 753860
MDC IDC MSMT LEADCHNL RA PACING THRESHOLD PULSEWIDTH: 0.5 ms
MDC IDC MSMT LEADCHNL RV IMPEDANCE VALUE: 430 Ohm
MDC IDC MSMT LEADCHNL RV SENSING INTR AMPL: 12 mV
MDC IDC SET LEADCHNL RA PACING AMPLITUDE: 2 V
MDC IDC SET LEADCHNL RV PACING AMPLITUDE: 2.5 V
MDC IDC STAT BRADY AP VP PERCENT: 41 %
MDC IDC STAT BRADY AS VP PERCENT: 42 %
MDC IDC STAT BRADY RA PERCENT PACED: 51 %
MDC IDC STAT BRADY RV PERCENT PACED: 83 %
Pulse Gen Model: 2240
Pulse Gen Serial Number: 7602208

## 2016-12-24 DIAGNOSIS — R531 Weakness: Secondary | ICD-10-CM | POA: Diagnosis not present

## 2016-12-25 ENCOUNTER — Emergency Department (HOSPITAL_BASED_OUTPATIENT_CLINIC_OR_DEPARTMENT_OTHER): Payer: Medicare Other

## 2016-12-25 ENCOUNTER — Emergency Department (HOSPITAL_BASED_OUTPATIENT_CLINIC_OR_DEPARTMENT_OTHER)
Admission: EM | Admit: 2016-12-25 | Discharge: 2016-12-25 | Disposition: A | Discharge status: Home / Self Care | Payer: Medicare Other | Attending: Emergency Medicine | Admitting: Emergency Medicine

## 2016-12-25 ENCOUNTER — Encounter (HOSPITAL_BASED_OUTPATIENT_CLINIC_OR_DEPARTMENT_OTHER): Payer: Self-pay | Admitting: *Deleted

## 2016-12-25 DIAGNOSIS — J189 Pneumonia, unspecified organism: Secondary | ICD-10-CM | POA: Insufficient documentation

## 2016-12-25 DIAGNOSIS — Z7982 Long term (current) use of aspirin: Secondary | ICD-10-CM | POA: Insufficient documentation

## 2016-12-25 DIAGNOSIS — J45909 Unspecified asthma, uncomplicated: Secondary | ICD-10-CM | POA: Insufficient documentation

## 2016-12-25 DIAGNOSIS — R410 Disorientation, unspecified: Secondary | ICD-10-CM

## 2016-12-25 DIAGNOSIS — G301 Alzheimer's disease with late onset: Secondary | ICD-10-CM | POA: Insufficient documentation

## 2016-12-25 DIAGNOSIS — J984 Other disorders of lung: Secondary | ICD-10-CM | POA: Diagnosis not present

## 2016-12-25 DIAGNOSIS — F0280 Dementia in other diseases classified elsewhere without behavioral disturbance: Secondary | ICD-10-CM

## 2016-12-25 DIAGNOSIS — Z79899 Other long term (current) drug therapy: Secondary | ICD-10-CM | POA: Diagnosis not present

## 2016-12-25 DIAGNOSIS — Z95810 Presence of automatic (implantable) cardiac defibrillator: Secondary | ICD-10-CM | POA: Insufficient documentation

## 2016-12-25 DIAGNOSIS — I10 Essential (primary) hypertension: Secondary | ICD-10-CM | POA: Diagnosis not present

## 2016-12-25 DIAGNOSIS — I110 Hypertensive heart disease with heart failure: Secondary | ICD-10-CM | POA: Diagnosis not present

## 2016-12-25 DIAGNOSIS — Z87891 Personal history of nicotine dependence: Secondary | ICD-10-CM | POA: Insufficient documentation

## 2016-12-25 DIAGNOSIS — R4182 Altered mental status, unspecified: Secondary | ICD-10-CM | POA: Diagnosis not present

## 2016-12-25 DIAGNOSIS — K5730 Diverticulosis of large intestine without perforation or abscess without bleeding: Secondary | ICD-10-CM | POA: Diagnosis not present

## 2016-12-25 DIAGNOSIS — R1084 Generalized abdominal pain: Secondary | ICD-10-CM | POA: Diagnosis not present

## 2016-12-25 DIAGNOSIS — I5032 Chronic diastolic (congestive) heart failure: Secondary | ICD-10-CM | POA: Insufficient documentation

## 2016-12-25 DIAGNOSIS — R9389 Abnormal findings on diagnostic imaging of other specified body structures: Secondary | ICD-10-CM

## 2016-12-25 DIAGNOSIS — F0390 Unspecified dementia without behavioral disturbance: Secondary | ICD-10-CM | POA: Diagnosis not present

## 2016-12-25 DIAGNOSIS — R918 Other nonspecific abnormal finding of lung field: Secondary | ICD-10-CM | POA: Diagnosis not present

## 2016-12-25 LAB — CBC
HCT: 35 % — ABNORMAL LOW (ref 39.0–52.0)
Hemoglobin: 11.5 g/dL — ABNORMAL LOW (ref 13.0–17.0)
MCH: 32.1 pg (ref 26.0–34.0)
MCHC: 32.9 g/dL (ref 30.0–36.0)
MCV: 97.8 fL (ref 78.0–100.0)
Platelets: 298 10*3/uL (ref 150–400)
RBC: 3.58 MIL/uL — ABNORMAL LOW (ref 4.22–5.81)
RDW: 12.4 % (ref 11.5–15.5)
WBC: 10.5 10*3/uL (ref 4.0–10.5)

## 2016-12-25 LAB — COMPREHENSIVE METABOLIC PANEL
ALBUMIN: 3.2 g/dL — AB (ref 3.5–5.0)
ALT: 35 U/L (ref 17–63)
ANION GAP: 8 (ref 5–15)
AST: 48 U/L — AB (ref 15–41)
Alkaline Phosphatase: 113 U/L (ref 38–126)
BUN: 21 mg/dL — AB (ref 6–20)
CHLORIDE: 102 mmol/L (ref 101–111)
CO2: 28 mmol/L (ref 22–32)
Calcium: 8.8 mg/dL — ABNORMAL LOW (ref 8.9–10.3)
Creatinine, Ser: 1.37 mg/dL — ABNORMAL HIGH (ref 0.61–1.24)
GFR calc Af Amer: 50 mL/min — ABNORMAL LOW (ref >60)
GFR calc non Af Amer: 43 mL/min — ABNORMAL LOW (ref >60)
GLUCOSE: 109 mg/dL — AB (ref 65–99)
Potassium: 4.5 mmol/L (ref 3.5–5.1)
SODIUM: 138 mmol/L (ref 135–145)
Total Bilirubin: 0.9 mg/dL (ref 0.3–1.2)
Total Protein: 7 g/dL (ref 6.5–8.1)

## 2016-12-25 LAB — URINALYSIS, ROUTINE W REFLEX MICROSCOPIC
Bilirubin Urine: NEGATIVE
GLUCOSE, UA: NEGATIVE mg/dL
HGB URINE DIPSTICK: NEGATIVE
KETONES UR: NEGATIVE mg/dL
Leukocytes, UA: NEGATIVE
Nitrite: NEGATIVE
PROTEIN: NEGATIVE mg/dL
Specific Gravity, Urine: 1.021 (ref 1.005–1.030)
pH: 6 (ref 5.0–8.0)

## 2016-12-25 MED ORDER — DOXYCYCLINE HYCLATE 100 MG PO TABS
100.0000 mg | ORAL_TABLET | Freq: Once | ORAL | Status: AC
  Administered 2016-12-25: 100 mg via ORAL
  Filled 2016-12-25: qty 1

## 2016-12-25 MED ORDER — DOXYCYCLINE HYCLATE 100 MG PO CAPS: 100.0000 mg | ORAL_CAPSULE | Freq: Two times a day (BID) | ORAL | 0 refills | Status: DC

## 2016-12-25 MED ORDER — IOPAMIDOL (ISOVUE-300) INJECTION 61%: 100.0000 mL | Freq: Once | INTRAVENOUS | Status: DC | PRN

## 2016-12-25 MED ORDER — IOPAMIDOL (ISOVUE-300) INJECTION 61%
100.0000 mL | Freq: Once | INTRAVENOUS | Status: AC | PRN
  Administered 2016-12-25: 80 mL via INTRAVENOUS

## 2016-12-25 MED ORDER — SODIUM CHLORIDE 0.9 % IV BOLUS (SEPSIS)
250.0000 mL | Freq: Once | INTRAVENOUS | Status: AC
Start: 2016-12-25 — End: 2016-12-25
  Administered 2016-12-25: 250 mL via INTRAVENOUS

## 2016-12-25 MED ORDER — SODIUM CHLORIDE 0.9 % IV SOLN
INTRAVENOUS | Status: DC
  Administered 2016-12-25: 15:00:00 via INTRAVENOUS

## 2016-12-25 MED ORDER — DEXTROSE 5 % IV SOLN
1.0000 g | Freq: Once | INTRAVENOUS | Status: AC
  Administered 2016-12-25: 1 g via INTRAVENOUS
  Filled 2016-12-25: qty 10

## 2016-12-25 MED ORDER — AMOXICILLIN-POT CLAVULANATE 250-62.5 MG/5ML PO SUSR: 1000.0000 mg | Freq: Two times a day (BID) | ORAL | 0 refills | Status: AC

## 2016-12-25 NOTE — ED Notes (Signed)
EMT Nando and Belenda Cruise changed pt into a dry brief.

## 2016-12-25 NOTE — ED Triage Notes (Signed)
Pt had dementia.  pts daughter reports that she is his primary caregiver.  Reports that pt had diarrhea this morning and malodorous urine.  Pts daughter trying to get hospice care and reports that the last 3 weeks pt has been declining and becoming more incontinent.  No distress noted.  Pt ambulatory and alert per his norm.

## 2016-12-25 NOTE — ED Provider Notes (Signed)
Patient visit shared.  Patient with history of dementia here with change in mental status over the last several weeks with occasional cough. CT is concerning for developing pneumonia, based on additional history by family questioned some degree of aspiration. Discussed with patient's daughter risk and benefits of hospitalization for treatment versus outpatient treatment. Plan to treat with antibiotics with close outpatient follow-up for swallow study and further assessment. Return precautions discussed.   Quintella Reichert, MD 12/26/16 [phone]

## 2016-12-25 NOTE — ED Provider Notes (Addendum)
[facility] Provider Note   CSN: [id number] Arrival date & time: 12/25/16  1257     History   Chief Complaint Chief Complaint  Patient presents with  . Altered Mental Status    HPI Daniel [name] is a 83 y.o. male.  HPI  Past Medical History:  Diagnosis Date  . Allergy   . Anxiety   . Cataract   . Degenerative arthritis   . Diverticulitis   . Diverticulitis   . Dyslipidemia   . Dysphagia    Mild  . Gastroesophageal reflux disease   . Hearing difficulty    bilateral hearing aids  . Hearing loss   . Hypertension   . Left bundle branch block   . Memory loss   . Nocturnal leg cramps   . OCD (obsessive compulsive disorder)   . Seizures ([place])   . Substance abuse   . Ulcer [facility])     Patient Active Problem List   Diagnosis Date Noted  . Chronic diastolic heart failure ([place]) 03/06/2016  . Dementia 03/06/2016  . GAD (generalized anxiety disorder) 09/04/2015  . Syncope and collapse 01/04/2014  . Cardiac pacemaker implanted- [employer] - 01/03/14 01/04/2014  . Acute diastolic heart failure ([place]) 01/04/2014  . Asthma 01/02/2014  . Heart block 01/02/2014  . Heart block AV second degree 01/02/2014  . BPH (benign prostatic hyperplasia) 01/24/2013  . GERD (gastroesophageal reflux disease) 01/24/2013  . HTN (hypertension) 01/24/2013  . Other and unspecified hyperlipidemia 01/24/2013  . Diverticular disease of colon 01/24/2013  . Alzheimer's disease 01/03/2013    Past Surgical History:  Procedure Laterality Date  . BASAL CELL CARCINOMA EXCISION    . CATARACT EXTRACTION Bilateral   . EYE SURGERY    . PERMANENT PACEMAKER INSERTION N/A 01/03/2014   Procedure: PERMANENT PACEMAKER INSERTION;  Surgeon: Sanda Klein, MD;  Location: [facility] CATH LAB;  Service: Cardiovascular;  Laterality: N/A;  . PROSTATE SURGERY    . TONSILLECTOMY    . TURP VAPORIZATION         Home Medications    Prior to Admission medications   Medication Sig Start Date End Date Taking?  Authorizing Provider  amoxicillin-clavulanate (AUGMENTIN) 875-125 MG tablet Take 1 tablet by mouth every 12 (twelve) hours. 09/26/16   Quintella Reichert, MD  aspirin 81 MG tablet Take 81 mg by mouth daily.    Historical Provider, MD  cholecalciferol (VITAMIN D) 1000 UNITS tablet Take 2,000 Units by mouth daily.     Historical Provider, MD  donepezil (ARICEPT) 5 MG tablet TAKE 1 TABLET (5 MG TOTAL) BY MOUTH 2 (TWO) TIMES DAILY. 08/24/16   Kathrynn Ducking, MD  fluticasone (FLONASE) 50 MCG/ACT nasal spray Place 2 sprays into both nostrils daily. 02/17/16   Leandrew Koyanagi, MD  memantine (NAMENDA) 10 MG tablet TAKE 1 TABLET (10 MG TOTAL) BY MOUTH 2 (TWO) TIMES DAILY. 01/16/16   Kathrynn Ducking, MD  mirabegron ER (MYRBETRIQ) 50 MG TB24 tablet Take 50 mg by mouth daily.    Historical Provider, MD  Multiple Vitamins-Minerals (PRESERVISION AREDS) TABS Take 1 tablet by mouth daily.    Historical Provider, MD  olopatadine (PATANOL) 0.1 % ophthalmic solution PLACE 1 DROP INTO BOTH EYES 2 (TWO) TIMES DAILY. 09/17/15   Leandrew Koyanagi, MD  omeprazole (PRILOSEC) 20 MG capsule Take 1 capsule (20 mg total) by mouth daily. Take in morning on empty stomach. 10/08/15   Leandrew Koyanagi, MD  sertraline (ZOLOFT) 100 MG tablet Take 1.5 tablets (150  mg total) by mouth daily. 10/20/16   Kathrynn Ducking, MD  triamcinolone cream (KENALOG) 0.1 % Apply 1 application topically as needed. Apply to affected area x 14 days 09/14/16   Historical Provider, MD    Family History Family History  Problem Relation Age of Onset  . Heart attack Mother   . Dementia Father   . Heart attack Sister   . Heart disease Sister   . Heart attack Brother   . Heart attack Sister   . Heart attack Brother   . Stroke Brother   . Heart disease Daughter   . Heart disease Paternal Grandmother     Social History Social History  Substance Use Topics  . Smoking status: Former Research scientist (life sciences)  . Smokeless tobacco: Never Used  . Alcohol use No      Comment: History of alcoholism, no longer drinking     Allergies   Sanctura [trospium] and Biaxin [clarithromycin]   Review of Systems Review of Systems  Unable to perform ROS: Mental status change     Physical Exam Updated Vital Signs BP (!) 119/58   Pulse 65   Temp 98.9 F (37.2 C) (Oral)   Resp 20   Ht 5' 7.5" (1.715 m)   Wt 69.5 kg   SpO2 97%   BMI 23.64 kg/m   Physical Exam  Constitutional: He appears well-developed and well-nourished. No distress.  HENT:  Head: Normocephalic and atraumatic.  Mouth/Throat: Oropharynx is clear and moist.  Eyes: EOM are normal. Pupils are equal, round, and reactive to light.  Neck: Normal range of motion. Neck supple.  Cardiovascular: Normal rate, regular rhythm and normal heart sounds.   Pulmonary/Chest: Effort normal and breath sounds normal.  Abdominal: Soft. Bowel sounds are normal. There is tenderness.  Patient with generalized abdominal pain no specific guarding. No palpable mass.  Musculoskeletal: Normal range of motion.  Neurological: He is alert.  Alert but will not follow commands not cooperative. According to caregiver this is baseline for him.  Skin: Skin is warm.  Nursing note and vitals reviewed.    ED Treatments / Results  Labs (all labs ordered are listed, but only abnormal results are displayed) Labs Reviewed  COMPREHENSIVE METABOLIC PANEL - Abnormal; Notable for the following:       Result Value   Glucose, Bld 109 (*)    BUN 21 (*)    Creatinine, Ser 1.37 (*)    Calcium 8.8 (*)    Albumin 3.2 (*)    AST 48 (*)    GFR calc non Af Amer 43 (*)    GFR calc Af Amer 50 (*)    All other components within normal limits  CBC - Abnormal; Notable for the following:    RBC 3.58 (*)    Hemoglobin 11.5 (*)    HCT 35.0 (*)    All other components within normal limits  URINALYSIS, ROUTINE W REFLEX MICROSCOPIC    EKG  EKG Interpretation  Date/Time:  Saturday December 25 2016 14:10:05 EDT Ventricular Rate:   60 PR Interval:    QRS Duration: 144 QT Interval:  453 QTC Calculation: 453 R Axis:   -44 Text Interpretation:  Sinus or ectopic atrial rhythm Atrial premature complex Left bundle branch block [place] AV Dual Paced Confirmed by Destry Bezdek  MD, Filipe Greathouse ([id number]) on 12/25/2016 2:22:01 PM       Radiology No results found.  Procedures Procedures (including critical care time)  Medications Ordered in ED Medications  0.9 %  sodium chloride  infusion ( Intravenous New Bag/Given 12/25/16 1508)  sodium chloride 0.9 % bolus 250 mL (0 mLs Intravenous Stopped 12/25/16 1508)     Initial Impression / Assessment and Plan / ED Course  I have reviewed the triage vital signs and the nursing notes.  Pertinent labs & imaging results that were available during my care of the patient were reviewed by me and considered in my medical decision making (see chart for details).     Patient brought in by family member whose caregiver. Patient for the past few weeks his had worsening of his baseline mental status. Patient has a long-standing history of dementia Alzheimer's. First diagnosed in 2005.  Caregiver noted strong smelling urine. And was concerned as urinary tract infection. However urinalysis here today is normal. Patient without fever. Patient on exam did have some generalized abdominal tenderness. Bowel sounds decreased.  Initial concern was for urinary tract infection. That was negative. Chest x-ray ordered to rule out pneumonia. Head CT ordered rule out any acute intracranial changes. Labs still pending. By blood cell count not markedly elevated. Since urinalysis is negative would recommend CT abdomen. Patient's renal function is borderline. Will order a CT abdomen and pelvis with and contrast but will have radiology determine if he is over the threshold for IV contrast.  Final Clinical Impressions(s) / ED Diagnoses   Final diagnoses:  Altered mental status, unspecified altered mental status type   Late onset Alzheimer's disease without behavioral disturbance    New Prescriptions New Prescriptions   No medications on file     Fredia Sorrow, MD 12/25/16 1530  Results for orders placed or performed during the hospital encounter of 12/25/16  Urinalysis, Routine w reflex microscopic  Result Value Ref Range   Color, Urine YELLOW YELLOW   APPearance CLEAR CLEAR   Specific Gravity, Urine 1.021 1.005 - 1.030   pH 6.0 5.0 - 8.0   Glucose, UA NEGATIVE NEGATIVE mg/dL   Hgb urine dipstick NEGATIVE NEGATIVE   Bilirubin Urine NEGATIVE NEGATIVE   Ketones, ur NEGATIVE NEGATIVE mg/dL   Protein, ur NEGATIVE NEGATIVE mg/dL   Nitrite NEGATIVE NEGATIVE   Leukocytes, UA NEGATIVE NEGATIVE  Comprehensive metabolic panel  Result Value Ref Range   Sodium 138 135 - 145 mmol/L   Potassium 4.5 3.5 - 5.1 mmol/L   Chloride 102 101 - 111 mmol/L   CO2 28 22 - 32 mmol/L   Glucose, Bld 109 (H) 65 - 99 mg/dL   BUN 21 (H) 6 - 20 mg/dL   Creatinine, Ser 1.37 (H) 0.61 - 1.24 mg/dL   Calcium 8.8 (L) 8.9 - 10.3 mg/dL   Total Protein 7.0 6.5 - 8.1 g/dL   Albumin 3.2 (L) 3.5 - 5.0 g/dL   AST 48 (H) 15 - 41 U/L   ALT 35 17 - 63 U/L   Alkaline Phosphatase 113 38 - 126 U/L   Total Bilirubin 0.9 0.3 - 1.2 mg/dL   GFR calc non Af Amer 43 (L) >60 mL/min   GFR calc Af Amer 50 (L) >60 mL/min   Anion gap 8 5 - 15  CBC  Result Value Ref Range   WBC 10.5 4.0 - 10.5 K/uL   RBC 3.58 (L) 4.22 - 5.81 MIL/uL   Hemoglobin 11.5 (L) 13.0 - 17.0 g/dL   HCT 35.0 (L) 39.0 - 52.0 %   MCV 97.8 78.0 - 100.0 fL   MCH 32.1 26.0 - 34.0 pg   MCHC 32.9 30.0 - 36.0 g/dL   RDW 12.4  11.5 - 15.5 %   Platelets 298 150 - 400 K/uL   Dg Chest 2 View  Result Date: 12/25/2016 CLINICAL DATA:  Diarrhea this morning and foul-smelling urine. Incontinent. EXAM: CHEST  2 VIEW COMPARISON:  01/04/2014 FINDINGS: Left-sided pacemaker unchanged. Lungs are adequately inflated with mild hazy opacification over the right upper lobe adjacent the  fissure. No evidence of effusion. Cardiomediastinal silhouette and remainder of the exam is unchanged. IMPRESSION: Minimal hazy density over the right upper lobe which may be due to atelectasis versus early infection. Electronically Signed   By: Marin Olp M.D.   On: 12/25/2016 15:23   Ct Head Wo Contrast  Result Date: 12/25/2016 CLINICAL DATA:  Dementia. EXAM: CT HEAD WITHOUT CONTRAST TECHNIQUE: Contiguous axial images were obtained from the base of the skull through the vertex without intravenous contrast. COMPARISON:  01/02/2014 FINDINGS: Brain: Ventricles, cisterns and other CSF spaces are within normal. There is mild chronic ischemic microvascular disease. There is no mass, mass effect, shift of midline structures or acute hemorrhage. No evidence of acute infarction. Vascular: Calcified plaque over the cavernous segment of the internal carotid arteries bilaterally. Skull: Within normal. Sinuses/Orbits: Within normal. Other: None. IMPRESSION: No acute intracranial findings. Minimal chronic ischemic microvascular disease. Electronically Signed   By: Marin Olp M.D.   On: 12/25/2016 15:19     A CT scan of the head without any acute findings. Chest x-ray raises some concern for pneumonia we'll do CT chest along with CT abdomen and pelvis.       Fredia Sorrow, MD 12/25/16 [phone]

## 2016-12-25 NOTE — ED Notes (Signed)
[name]/caregiver reports that pt has had an acute change in mental status x 3 weeks. Reports a noticeable decline in cognitive function and ability to cary our ADLs

## 2016-12-25 NOTE — ED Notes (Signed)
Patient transported to CT 

## 2016-12-26 ENCOUNTER — Telehealth (HOSPITAL_BASED_OUTPATIENT_CLINIC_OR_DEPARTMENT_OTHER): Payer: Self-pay | Admitting: *Deleted

## 2016-12-30 DIAGNOSIS — G301 Alzheimer's disease with late onset: Secondary | ICD-10-CM | POA: Diagnosis not present

## 2016-12-30 DIAGNOSIS — J690 Pneumonitis due to inhalation of food and vomit: Secondary | ICD-10-CM | POA: Diagnosis not present

## 2016-12-30 DIAGNOSIS — F0281 Dementia in other diseases classified elsewhere with behavioral disturbance: Secondary | ICD-10-CM | POA: Diagnosis not present

## 2016-12-30 DIAGNOSIS — Z6824 Body mass index (BMI) 24.0-24.9, adult: Secondary | ICD-10-CM | POA: Diagnosis not present

## 2017-01-04 DIAGNOSIS — J690 Pneumonitis due to inhalation of food and vomit: Secondary | ICD-10-CM | POA: Diagnosis not present

## 2017-01-04 DIAGNOSIS — G301 Alzheimer's disease with late onset: Secondary | ICD-10-CM | POA: Diagnosis not present

## 2017-01-04 DIAGNOSIS — R1310 Dysphagia, unspecified: Secondary | ICD-10-CM | POA: Diagnosis not present

## 2017-01-04 DIAGNOSIS — Z87891 Personal history of nicotine dependence: Secondary | ICD-10-CM | POA: Diagnosis not present

## 2017-01-04 DIAGNOSIS — Z950 Presence of cardiac pacemaker: Secondary | ICD-10-CM | POA: Diagnosis not present

## 2017-01-04 DIAGNOSIS — F0281 Dementia in other diseases classified elsewhere with behavioral disturbance: Secondary | ICD-10-CM | POA: Diagnosis not present

## 2017-01-05 DIAGNOSIS — F0281 Dementia in other diseases classified elsewhere with behavioral disturbance: Secondary | ICD-10-CM | POA: Diagnosis not present

## 2017-01-05 DIAGNOSIS — J690 Pneumonitis due to inhalation of food and vomit: Secondary | ICD-10-CM | POA: Diagnosis not present

## 2017-01-05 DIAGNOSIS — R1310 Dysphagia, unspecified: Secondary | ICD-10-CM | POA: Diagnosis not present

## 2017-01-05 DIAGNOSIS — Z87891 Personal history of nicotine dependence: Secondary | ICD-10-CM | POA: Diagnosis not present

## 2017-01-05 DIAGNOSIS — G301 Alzheimer's disease with late onset: Secondary | ICD-10-CM | POA: Diagnosis not present

## 2017-01-05 DIAGNOSIS — Z950 Presence of cardiac pacemaker: Secondary | ICD-10-CM | POA: Diagnosis not present

## 2017-01-06 DIAGNOSIS — R1310 Dysphagia, unspecified: Secondary | ICD-10-CM | POA: Diagnosis not present

## 2017-01-06 DIAGNOSIS — G301 Alzheimer's disease with late onset: Secondary | ICD-10-CM | POA: Diagnosis not present

## 2017-01-06 DIAGNOSIS — Z87891 Personal history of nicotine dependence: Secondary | ICD-10-CM | POA: Diagnosis not present

## 2017-01-06 DIAGNOSIS — Z950 Presence of cardiac pacemaker: Secondary | ICD-10-CM | POA: Diagnosis not present

## 2017-01-06 DIAGNOSIS — J690 Pneumonitis due to inhalation of food and vomit: Secondary | ICD-10-CM | POA: Diagnosis not present

## 2017-01-06 DIAGNOSIS — F0281 Dementia in other diseases classified elsewhere with behavioral disturbance: Secondary | ICD-10-CM | POA: Diagnosis not present

## 2017-01-10 ENCOUNTER — Other Ambulatory Visit: Payer: Self-pay | Admitting: Neurology

## 2017-01-10 DIAGNOSIS — J690 Pneumonitis due to inhalation of food and vomit: Secondary | ICD-10-CM | POA: Diagnosis not present

## 2017-01-10 DIAGNOSIS — G301 Alzheimer's disease with late onset: Secondary | ICD-10-CM | POA: Diagnosis not present

## 2017-01-10 DIAGNOSIS — Z87891 Personal history of nicotine dependence: Secondary | ICD-10-CM | POA: Diagnosis not present

## 2017-01-10 DIAGNOSIS — R1310 Dysphagia, unspecified: Secondary | ICD-10-CM | POA: Diagnosis not present

## 2017-01-10 DIAGNOSIS — F0281 Dementia in other diseases classified elsewhere with behavioral disturbance: Secondary | ICD-10-CM | POA: Diagnosis not present

## 2017-01-10 DIAGNOSIS — Z950 Presence of cardiac pacemaker: Secondary | ICD-10-CM | POA: Diagnosis not present

## 2017-01-11 DIAGNOSIS — Z950 Presence of cardiac pacemaker: Secondary | ICD-10-CM | POA: Diagnosis not present

## 2017-01-11 DIAGNOSIS — F0281 Dementia in other diseases classified elsewhere with behavioral disturbance: Secondary | ICD-10-CM | POA: Diagnosis not present

## 2017-01-11 DIAGNOSIS — J690 Pneumonitis due to inhalation of food and vomit: Secondary | ICD-10-CM | POA: Diagnosis not present

## 2017-01-11 DIAGNOSIS — R1310 Dysphagia, unspecified: Secondary | ICD-10-CM | POA: Diagnosis not present

## 2017-01-11 DIAGNOSIS — Z87891 Personal history of nicotine dependence: Secondary | ICD-10-CM | POA: Diagnosis not present

## 2017-01-11 DIAGNOSIS — G301 Alzheimer's disease with late onset: Secondary | ICD-10-CM | POA: Diagnosis not present

## 2017-01-12 ENCOUNTER — Ambulatory Visit: Payer: Medicare Other | Admitting: Podiatry

## 2017-01-12 DIAGNOSIS — Z87891 Personal history of nicotine dependence: Secondary | ICD-10-CM | POA: Diagnosis not present

## 2017-01-12 DIAGNOSIS — F0281 Dementia in other diseases classified elsewhere with behavioral disturbance: Secondary | ICD-10-CM | POA: Diagnosis not present

## 2017-01-12 DIAGNOSIS — Z950 Presence of cardiac pacemaker: Secondary | ICD-10-CM | POA: Diagnosis not present

## 2017-01-12 DIAGNOSIS — Z6824 Body mass index (BMI) 24.0-24.9, adult: Secondary | ICD-10-CM | POA: Diagnosis not present

## 2017-01-12 DIAGNOSIS — R1310 Dysphagia, unspecified: Secondary | ICD-10-CM | POA: Diagnosis not present

## 2017-01-12 DIAGNOSIS — G301 Alzheimer's disease with late onset: Secondary | ICD-10-CM | POA: Diagnosis not present

## 2017-01-12 DIAGNOSIS — J690 Pneumonitis due to inhalation of food and vomit: Secondary | ICD-10-CM | POA: Diagnosis not present

## 2017-01-14 DIAGNOSIS — I495 Sick sinus syndrome: Secondary | ICD-10-CM | POA: Diagnosis not present

## 2017-01-14 DIAGNOSIS — F411 Generalized anxiety disorder: Secondary | ICD-10-CM | POA: Diagnosis not present

## 2017-01-14 DIAGNOSIS — N401 Enlarged prostate with lower urinary tract symptoms: Secondary | ICD-10-CM | POA: Diagnosis not present

## 2017-01-14 DIAGNOSIS — I10 Essential (primary) hypertension: Secondary | ICD-10-CM | POA: Diagnosis not present

## 2017-01-14 DIAGNOSIS — I5030 Unspecified diastolic (congestive) heart failure: Secondary | ICD-10-CM | POA: Diagnosis not present

## 2017-01-14 DIAGNOSIS — I25810 Atherosclerosis of coronary artery bypass graft(s) without angina pectoris: Secondary | ICD-10-CM | POA: Diagnosis not present

## 2017-01-14 DIAGNOSIS — F339 Major depressive disorder, recurrent, unspecified: Secondary | ICD-10-CM | POA: Diagnosis not present

## 2017-01-14 DIAGNOSIS — I672 Cerebral atherosclerosis: Secondary | ICD-10-CM | POA: Diagnosis not present

## 2017-01-14 DIAGNOSIS — R1310 Dysphagia, unspecified: Secondary | ICD-10-CM | POA: Diagnosis not present

## 2017-01-14 DIAGNOSIS — K219 Gastro-esophageal reflux disease without esophagitis: Secondary | ICD-10-CM | POA: Diagnosis not present

## 2017-01-14 DIAGNOSIS — N183 Chronic kidney disease, stage 3 (moderate): Secondary | ICD-10-CM | POA: Diagnosis not present

## 2017-01-14 DIAGNOSIS — F0150 Vascular dementia without behavioral disturbance: Secondary | ICD-10-CM | POA: Diagnosis not present

## 2017-01-14 DIAGNOSIS — N3281 Overactive bladder: Secondary | ICD-10-CM | POA: Diagnosis not present

## 2017-01-14 DIAGNOSIS — J309 Allergic rhinitis, unspecified: Secondary | ICD-10-CM | POA: Diagnosis not present

## 2017-01-14 DIAGNOSIS — E785 Hyperlipidemia, unspecified: Secondary | ICD-10-CM | POA: Diagnosis not present

## 2017-01-14 DIAGNOSIS — R54 Age-related physical debility: Secondary | ICD-10-CM | POA: Diagnosis not present

## 2017-01-17 DIAGNOSIS — I672 Cerebral atherosclerosis: Secondary | ICD-10-CM | POA: Diagnosis not present

## 2017-01-17 DIAGNOSIS — N183 Chronic kidney disease, stage 3 (moderate): Secondary | ICD-10-CM | POA: Diagnosis not present

## 2017-01-17 DIAGNOSIS — I5030 Unspecified diastolic (congestive) heart failure: Secondary | ICD-10-CM | POA: Diagnosis not present

## 2017-01-17 DIAGNOSIS — F0150 Vascular dementia without behavioral disturbance: Secondary | ICD-10-CM | POA: Diagnosis not present

## 2017-01-17 DIAGNOSIS — I495 Sick sinus syndrome: Secondary | ICD-10-CM | POA: Diagnosis not present

## 2017-01-17 DIAGNOSIS — I25810 Atherosclerosis of coronary artery bypass graft(s) without angina pectoris: Secondary | ICD-10-CM | POA: Diagnosis not present

## 2017-01-19 DIAGNOSIS — I5030 Unspecified diastolic (congestive) heart failure: Secondary | ICD-10-CM | POA: Diagnosis not present

## 2017-01-19 DIAGNOSIS — I25810 Atherosclerosis of coronary artery bypass graft(s) without angina pectoris: Secondary | ICD-10-CM | POA: Diagnosis not present

## 2017-01-19 DIAGNOSIS — N183 Chronic kidney disease, stage 3 (moderate): Secondary | ICD-10-CM | POA: Diagnosis not present

## 2017-01-19 DIAGNOSIS — F0150 Vascular dementia without behavioral disturbance: Secondary | ICD-10-CM | POA: Diagnosis not present

## 2017-01-19 DIAGNOSIS — I672 Cerebral atherosclerosis: Secondary | ICD-10-CM | POA: Diagnosis not present

## 2017-01-19 DIAGNOSIS — I495 Sick sinus syndrome: Secondary | ICD-10-CM | POA: Diagnosis not present

## 2017-01-21 DIAGNOSIS — F0150 Vascular dementia without behavioral disturbance: Secondary | ICD-10-CM | POA: Diagnosis not present

## 2017-01-21 DIAGNOSIS — I5030 Unspecified diastolic (congestive) heart failure: Secondary | ICD-10-CM | POA: Diagnosis not present

## 2017-01-21 DIAGNOSIS — I25810 Atherosclerosis of coronary artery bypass graft(s) without angina pectoris: Secondary | ICD-10-CM | POA: Diagnosis not present

## 2017-01-21 DIAGNOSIS — I672 Cerebral atherosclerosis: Secondary | ICD-10-CM | POA: Diagnosis not present

## 2017-01-21 DIAGNOSIS — I495 Sick sinus syndrome: Secondary | ICD-10-CM | POA: Diagnosis not present

## 2017-01-21 DIAGNOSIS — N183 Chronic kidney disease, stage 3 (moderate): Secondary | ICD-10-CM | POA: Diagnosis not present

## 2017-01-24 DIAGNOSIS — F0150 Vascular dementia without behavioral disturbance: Secondary | ICD-10-CM | POA: Diagnosis not present

## 2017-01-24 DIAGNOSIS — N183 Chronic kidney disease, stage 3 (moderate): Secondary | ICD-10-CM | POA: Diagnosis not present

## 2017-01-24 DIAGNOSIS — I25810 Atherosclerosis of coronary artery bypass graft(s) without angina pectoris: Secondary | ICD-10-CM | POA: Diagnosis not present

## 2017-01-24 DIAGNOSIS — I672 Cerebral atherosclerosis: Secondary | ICD-10-CM | POA: Diagnosis not present

## 2017-01-24 DIAGNOSIS — I5030 Unspecified diastolic (congestive) heart failure: Secondary | ICD-10-CM | POA: Diagnosis not present

## 2017-01-24 DIAGNOSIS — I495 Sick sinus syndrome: Secondary | ICD-10-CM | POA: Diagnosis not present

## 2017-01-26 DIAGNOSIS — I672 Cerebral atherosclerosis: Secondary | ICD-10-CM | POA: Diagnosis not present

## 2017-01-26 DIAGNOSIS — I495 Sick sinus syndrome: Secondary | ICD-10-CM | POA: Diagnosis not present

## 2017-01-26 DIAGNOSIS — F0150 Vascular dementia without behavioral disturbance: Secondary | ICD-10-CM | POA: Diagnosis not present

## 2017-01-26 DIAGNOSIS — I5030 Unspecified diastolic (congestive) heart failure: Secondary | ICD-10-CM | POA: Diagnosis not present

## 2017-01-26 DIAGNOSIS — N183 Chronic kidney disease, stage 3 (moderate): Secondary | ICD-10-CM | POA: Diagnosis not present

## 2017-01-26 DIAGNOSIS — I25810 Atherosclerosis of coronary artery bypass graft(s) without angina pectoris: Secondary | ICD-10-CM | POA: Diagnosis not present

## 2017-01-28 DIAGNOSIS — I25810 Atherosclerosis of coronary artery bypass graft(s) without angina pectoris: Secondary | ICD-10-CM | POA: Diagnosis not present

## 2017-01-28 DIAGNOSIS — N183 Chronic kidney disease, stage 3 (moderate): Secondary | ICD-10-CM | POA: Diagnosis not present

## 2017-01-28 DIAGNOSIS — I5030 Unspecified diastolic (congestive) heart failure: Secondary | ICD-10-CM | POA: Diagnosis not present

## 2017-01-28 DIAGNOSIS — I672 Cerebral atherosclerosis: Secondary | ICD-10-CM | POA: Diagnosis not present

## 2017-01-28 DIAGNOSIS — I495 Sick sinus syndrome: Secondary | ICD-10-CM | POA: Diagnosis not present

## 2017-01-28 DIAGNOSIS — F0150 Vascular dementia without behavioral disturbance: Secondary | ICD-10-CM | POA: Diagnosis not present

## 2017-01-31 DIAGNOSIS — I5030 Unspecified diastolic (congestive) heart failure: Secondary | ICD-10-CM | POA: Diagnosis not present

## 2017-01-31 DIAGNOSIS — F0150 Vascular dementia without behavioral disturbance: Secondary | ICD-10-CM | POA: Diagnosis not present

## 2017-01-31 DIAGNOSIS — I25810 Atherosclerosis of coronary artery bypass graft(s) without angina pectoris: Secondary | ICD-10-CM | POA: Diagnosis not present

## 2017-01-31 DIAGNOSIS — N183 Chronic kidney disease, stage 3 (moderate): Secondary | ICD-10-CM | POA: Diagnosis not present

## 2017-01-31 DIAGNOSIS — I495 Sick sinus syndrome: Secondary | ICD-10-CM | POA: Diagnosis not present

## 2017-01-31 DIAGNOSIS — I672 Cerebral atherosclerosis: Secondary | ICD-10-CM | POA: Diagnosis not present

## 2017-02-01 DIAGNOSIS — I25810 Atherosclerosis of coronary artery bypass graft(s) without angina pectoris: Secondary | ICD-10-CM | POA: Diagnosis not present

## 2017-02-01 DIAGNOSIS — N183 Chronic kidney disease, stage 3 (moderate): Secondary | ICD-10-CM | POA: Diagnosis not present

## 2017-02-01 DIAGNOSIS — I5030 Unspecified diastolic (congestive) heart failure: Secondary | ICD-10-CM | POA: Diagnosis not present

## 2017-02-01 DIAGNOSIS — I495 Sick sinus syndrome: Secondary | ICD-10-CM | POA: Diagnosis not present

## 2017-02-01 DIAGNOSIS — F0150 Vascular dementia without behavioral disturbance: Secondary | ICD-10-CM | POA: Diagnosis not present

## 2017-02-01 DIAGNOSIS — I672 Cerebral atherosclerosis: Secondary | ICD-10-CM | POA: Diagnosis not present

## 2017-02-02 DIAGNOSIS — I25810 Atherosclerosis of coronary artery bypass graft(s) without angina pectoris: Secondary | ICD-10-CM | POA: Diagnosis not present

## 2017-02-02 DIAGNOSIS — F0150 Vascular dementia without behavioral disturbance: Secondary | ICD-10-CM | POA: Diagnosis not present

## 2017-02-02 DIAGNOSIS — N183 Chronic kidney disease, stage 3 (moderate): Secondary | ICD-10-CM | POA: Diagnosis not present

## 2017-02-02 DIAGNOSIS — I495 Sick sinus syndrome: Secondary | ICD-10-CM | POA: Diagnosis not present

## 2017-02-02 DIAGNOSIS — I672 Cerebral atherosclerosis: Secondary | ICD-10-CM | POA: Diagnosis not present

## 2017-02-02 DIAGNOSIS — I5030 Unspecified diastolic (congestive) heart failure: Secondary | ICD-10-CM | POA: Diagnosis not present

## 2017-02-04 DIAGNOSIS — I5030 Unspecified diastolic (congestive) heart failure: Secondary | ICD-10-CM | POA: Diagnosis not present

## 2017-02-04 DIAGNOSIS — I672 Cerebral atherosclerosis: Secondary | ICD-10-CM | POA: Diagnosis not present

## 2017-02-04 DIAGNOSIS — N183 Chronic kidney disease, stage 3 (moderate): Secondary | ICD-10-CM | POA: Diagnosis not present

## 2017-02-04 DIAGNOSIS — I495 Sick sinus syndrome: Secondary | ICD-10-CM | POA: Diagnosis not present

## 2017-02-04 DIAGNOSIS — F0150 Vascular dementia without behavioral disturbance: Secondary | ICD-10-CM | POA: Diagnosis not present

## 2017-02-04 DIAGNOSIS — I25810 Atherosclerosis of coronary artery bypass graft(s) without angina pectoris: Secondary | ICD-10-CM | POA: Diagnosis not present

## 2017-02-07 DIAGNOSIS — I25810 Atherosclerosis of coronary artery bypass graft(s) without angina pectoris: Secondary | ICD-10-CM | POA: Diagnosis not present

## 2017-02-07 DIAGNOSIS — F0150 Vascular dementia without behavioral disturbance: Secondary | ICD-10-CM | POA: Diagnosis not present

## 2017-02-07 DIAGNOSIS — N183 Chronic kidney disease, stage 3 (moderate): Secondary | ICD-10-CM | POA: Diagnosis not present

## 2017-02-07 DIAGNOSIS — I495 Sick sinus syndrome: Secondary | ICD-10-CM | POA: Diagnosis not present

## 2017-02-07 DIAGNOSIS — I672 Cerebral atherosclerosis: Secondary | ICD-10-CM | POA: Diagnosis not present

## 2017-02-07 DIAGNOSIS — I5030 Unspecified diastolic (congestive) heart failure: Secondary | ICD-10-CM | POA: Diagnosis not present

## 2017-02-08 DIAGNOSIS — I10 Essential (primary) hypertension: Secondary | ICD-10-CM | POA: Diagnosis not present

## 2017-02-08 DIAGNOSIS — I495 Sick sinus syndrome: Secondary | ICD-10-CM | POA: Diagnosis not present

## 2017-02-08 DIAGNOSIS — J309 Allergic rhinitis, unspecified: Secondary | ICD-10-CM | POA: Diagnosis not present

## 2017-02-08 DIAGNOSIS — R54 Age-related physical debility: Secondary | ICD-10-CM | POA: Diagnosis not present

## 2017-02-08 DIAGNOSIS — F339 Major depressive disorder, recurrent, unspecified: Secondary | ICD-10-CM | POA: Diagnosis not present

## 2017-02-08 DIAGNOSIS — F411 Generalized anxiety disorder: Secondary | ICD-10-CM | POA: Diagnosis not present

## 2017-02-08 DIAGNOSIS — I25810 Atherosclerosis of coronary artery bypass graft(s) without angina pectoris: Secondary | ICD-10-CM | POA: Diagnosis not present

## 2017-02-08 DIAGNOSIS — F0150 Vascular dementia without behavioral disturbance: Secondary | ICD-10-CM | POA: Diagnosis not present

## 2017-02-08 DIAGNOSIS — N3281 Overactive bladder: Secondary | ICD-10-CM | POA: Diagnosis not present

## 2017-02-08 DIAGNOSIS — I5030 Unspecified diastolic (congestive) heart failure: Secondary | ICD-10-CM | POA: Diagnosis not present

## 2017-02-08 DIAGNOSIS — I672 Cerebral atherosclerosis: Secondary | ICD-10-CM | POA: Diagnosis not present

## 2017-02-08 DIAGNOSIS — N183 Chronic kidney disease, stage 3 (moderate): Secondary | ICD-10-CM | POA: Diagnosis not present

## 2017-02-08 DIAGNOSIS — K219 Gastro-esophageal reflux disease without esophagitis: Secondary | ICD-10-CM | POA: Diagnosis not present

## 2017-02-08 DIAGNOSIS — N401 Enlarged prostate with lower urinary tract symptoms: Secondary | ICD-10-CM | POA: Diagnosis not present

## 2017-02-08 DIAGNOSIS — R1310 Dysphagia, unspecified: Secondary | ICD-10-CM | POA: Diagnosis not present

## 2017-02-08 DIAGNOSIS — E785 Hyperlipidemia, unspecified: Secondary | ICD-10-CM | POA: Diagnosis not present

## 2017-02-09 DIAGNOSIS — I25810 Atherosclerosis of coronary artery bypass graft(s) without angina pectoris: Secondary | ICD-10-CM | POA: Diagnosis not present

## 2017-02-09 DIAGNOSIS — I672 Cerebral atherosclerosis: Secondary | ICD-10-CM | POA: Diagnosis not present

## 2017-02-09 DIAGNOSIS — I5030 Unspecified diastolic (congestive) heart failure: Secondary | ICD-10-CM | POA: Diagnosis not present

## 2017-02-09 DIAGNOSIS — F0150 Vascular dementia without behavioral disturbance: Secondary | ICD-10-CM | POA: Diagnosis not present

## 2017-02-09 DIAGNOSIS — N183 Chronic kidney disease, stage 3 (moderate): Secondary | ICD-10-CM | POA: Diagnosis not present

## 2017-02-09 DIAGNOSIS — I495 Sick sinus syndrome: Secondary | ICD-10-CM | POA: Diagnosis not present

## 2017-02-11 DIAGNOSIS — I495 Sick sinus syndrome: Secondary | ICD-10-CM | POA: Diagnosis not present

## 2017-02-11 DIAGNOSIS — N183 Chronic kidney disease, stage 3 (moderate): Secondary | ICD-10-CM | POA: Diagnosis not present

## 2017-02-11 DIAGNOSIS — F0150 Vascular dementia without behavioral disturbance: Secondary | ICD-10-CM | POA: Diagnosis not present

## 2017-02-11 DIAGNOSIS — I25810 Atherosclerosis of coronary artery bypass graft(s) without angina pectoris: Secondary | ICD-10-CM | POA: Diagnosis not present

## 2017-02-11 DIAGNOSIS — I672 Cerebral atherosclerosis: Secondary | ICD-10-CM | POA: Diagnosis not present

## 2017-02-11 DIAGNOSIS — I5030 Unspecified diastolic (congestive) heart failure: Secondary | ICD-10-CM | POA: Diagnosis not present

## 2017-02-14 DIAGNOSIS — I672 Cerebral atherosclerosis: Secondary | ICD-10-CM | POA: Diagnosis not present

## 2017-02-14 DIAGNOSIS — I25810 Atherosclerosis of coronary artery bypass graft(s) without angina pectoris: Secondary | ICD-10-CM | POA: Diagnosis not present

## 2017-02-14 DIAGNOSIS — I5030 Unspecified diastolic (congestive) heart failure: Secondary | ICD-10-CM | POA: Diagnosis not present

## 2017-02-14 DIAGNOSIS — N183 Chronic kidney disease, stage 3 (moderate): Secondary | ICD-10-CM | POA: Diagnosis not present

## 2017-02-14 DIAGNOSIS — F0150 Vascular dementia without behavioral disturbance: Secondary | ICD-10-CM | POA: Diagnosis not present

## 2017-02-14 DIAGNOSIS — I495 Sick sinus syndrome: Secondary | ICD-10-CM | POA: Diagnosis not present

## 2017-02-16 DIAGNOSIS — N183 Chronic kidney disease, stage 3 (moderate): Secondary | ICD-10-CM | POA: Diagnosis not present

## 2017-02-16 DIAGNOSIS — I495 Sick sinus syndrome: Secondary | ICD-10-CM | POA: Diagnosis not present

## 2017-02-16 DIAGNOSIS — F0150 Vascular dementia without behavioral disturbance: Secondary | ICD-10-CM | POA: Diagnosis not present

## 2017-02-16 DIAGNOSIS — I672 Cerebral atherosclerosis: Secondary | ICD-10-CM | POA: Diagnosis not present

## 2017-02-16 DIAGNOSIS — I5030 Unspecified diastolic (congestive) heart failure: Secondary | ICD-10-CM | POA: Diagnosis not present

## 2017-02-16 DIAGNOSIS — I25810 Atherosclerosis of coronary artery bypass graft(s) without angina pectoris: Secondary | ICD-10-CM | POA: Diagnosis not present

## 2017-02-18 DIAGNOSIS — I495 Sick sinus syndrome: Secondary | ICD-10-CM | POA: Diagnosis not present

## 2017-02-18 DIAGNOSIS — F0150 Vascular dementia without behavioral disturbance: Secondary | ICD-10-CM | POA: Diagnosis not present

## 2017-02-18 DIAGNOSIS — I5030 Unspecified diastolic (congestive) heart failure: Secondary | ICD-10-CM | POA: Diagnosis not present

## 2017-02-18 DIAGNOSIS — N183 Chronic kidney disease, stage 3 (moderate): Secondary | ICD-10-CM | POA: Diagnosis not present

## 2017-02-18 DIAGNOSIS — I25810 Atherosclerosis of coronary artery bypass graft(s) without angina pectoris: Secondary | ICD-10-CM | POA: Diagnosis not present

## 2017-02-18 DIAGNOSIS — I672 Cerebral atherosclerosis: Secondary | ICD-10-CM | POA: Diagnosis not present

## 2017-02-21 ENCOUNTER — Telehealth: Payer: Self-pay | Admitting: Neurology

## 2017-02-21 DIAGNOSIS — I495 Sick sinus syndrome: Secondary | ICD-10-CM | POA: Diagnosis not present

## 2017-02-21 DIAGNOSIS — F0150 Vascular dementia without behavioral disturbance: Secondary | ICD-10-CM | POA: Diagnosis not present

## 2017-02-21 DIAGNOSIS — I5030 Unspecified diastolic (congestive) heart failure: Secondary | ICD-10-CM | POA: Diagnosis not present

## 2017-02-21 DIAGNOSIS — I672 Cerebral atherosclerosis: Secondary | ICD-10-CM | POA: Diagnosis not present

## 2017-02-21 DIAGNOSIS — I25810 Atherosclerosis of coronary artery bypass graft(s) without angina pectoris: Secondary | ICD-10-CM | POA: Diagnosis not present

## 2017-02-21 DIAGNOSIS — N183 Chronic kidney disease, stage 3 (moderate): Secondary | ICD-10-CM | POA: Diagnosis not present

## 2017-02-21 NOTE — Telephone Encounter (Signed)
Rn call patients daughter Daniel [name] about her father is currently in home hospice. PTs daughter did not know if she should bring her father in for his app this week. Rn stated when patient is under hospice care, they take over all decisions,and medication concerns. Daniel [name] stated her father sometimes does not want to get dress, and can get difficult at times. He will not stay in the bed when needed. Daniel [name] stated the hospice doctors recommend pt discontinue the namenda medication, because the alzheimer's is advance.Daniel [name] stated she has decided to continue giving her father the medication. She explain that if she stops the namenda medication for her father if may make his behavior worst. Daniel [name] cancel the appt, and will call back for any issues.

## 2017-02-21 NOTE — Telephone Encounter (Signed)
Dr. Willis- FYI 

## 2017-02-21 NOTE — Telephone Encounter (Signed)
Patient has an appointment with Dr. Jannifer Franklin on 02-23-17. His daughter says he is in Hospice at home and wants to know if this appointment is needed.

## 2017-02-22 DIAGNOSIS — I25810 Atherosclerosis of coronary artery bypass graft(s) without angina pectoris: Secondary | ICD-10-CM | POA: Diagnosis not present

## 2017-02-22 DIAGNOSIS — I5030 Unspecified diastolic (congestive) heart failure: Secondary | ICD-10-CM | POA: Diagnosis not present

## 2017-02-22 DIAGNOSIS — N183 Chronic kidney disease, stage 3 (moderate): Secondary | ICD-10-CM | POA: Diagnosis not present

## 2017-02-22 DIAGNOSIS — I495 Sick sinus syndrome: Secondary | ICD-10-CM | POA: Diagnosis not present

## 2017-02-22 DIAGNOSIS — I672 Cerebral atherosclerosis: Secondary | ICD-10-CM | POA: Diagnosis not present

## 2017-02-22 DIAGNOSIS — F0150 Vascular dementia without behavioral disturbance: Secondary | ICD-10-CM | POA: Diagnosis not present

## 2017-02-23 ENCOUNTER — Ambulatory Visit: Payer: Medicare Other | Admitting: Neurology

## 2017-02-23 DIAGNOSIS — N183 Chronic kidney disease, stage 3 (moderate): Secondary | ICD-10-CM | POA: Diagnosis not present

## 2017-02-23 DIAGNOSIS — F0150 Vascular dementia without behavioral disturbance: Secondary | ICD-10-CM | POA: Diagnosis not present

## 2017-02-23 DIAGNOSIS — I672 Cerebral atherosclerosis: Secondary | ICD-10-CM | POA: Diagnosis not present

## 2017-02-23 DIAGNOSIS — I25810 Atherosclerosis of coronary artery bypass graft(s) without angina pectoris: Secondary | ICD-10-CM | POA: Diagnosis not present

## 2017-02-23 DIAGNOSIS — I5030 Unspecified diastolic (congestive) heart failure: Secondary | ICD-10-CM | POA: Diagnosis not present

## 2017-02-23 DIAGNOSIS — I495 Sick sinus syndrome: Secondary | ICD-10-CM | POA: Diagnosis not present

## 2017-02-25 DIAGNOSIS — I495 Sick sinus syndrome: Secondary | ICD-10-CM | POA: Diagnosis not present

## 2017-02-25 DIAGNOSIS — F0150 Vascular dementia without behavioral disturbance: Secondary | ICD-10-CM | POA: Diagnosis not present

## 2017-02-25 DIAGNOSIS — I25810 Atherosclerosis of coronary artery bypass graft(s) without angina pectoris: Secondary | ICD-10-CM | POA: Diagnosis not present

## 2017-02-25 DIAGNOSIS — N183 Chronic kidney disease, stage 3 (moderate): Secondary | ICD-10-CM | POA: Diagnosis not present

## 2017-02-25 DIAGNOSIS — I5030 Unspecified diastolic (congestive) heart failure: Secondary | ICD-10-CM | POA: Diagnosis not present

## 2017-02-25 DIAGNOSIS — I672 Cerebral atherosclerosis: Secondary | ICD-10-CM | POA: Diagnosis not present

## 2017-02-28 DIAGNOSIS — I672 Cerebral atherosclerosis: Secondary | ICD-10-CM | POA: Diagnosis not present

## 2017-02-28 DIAGNOSIS — I495 Sick sinus syndrome: Secondary | ICD-10-CM | POA: Diagnosis not present

## 2017-02-28 DIAGNOSIS — N183 Chronic kidney disease, stage 3 (moderate): Secondary | ICD-10-CM | POA: Diagnosis not present

## 2017-02-28 DIAGNOSIS — I25810 Atherosclerosis of coronary artery bypass graft(s) without angina pectoris: Secondary | ICD-10-CM | POA: Diagnosis not present

## 2017-02-28 DIAGNOSIS — F0150 Vascular dementia without behavioral disturbance: Secondary | ICD-10-CM | POA: Diagnosis not present

## 2017-02-28 DIAGNOSIS — I5030 Unspecified diastolic (congestive) heart failure: Secondary | ICD-10-CM | POA: Diagnosis not present

## 2017-03-02 DIAGNOSIS — I495 Sick sinus syndrome: Secondary | ICD-10-CM | POA: Diagnosis not present

## 2017-03-02 DIAGNOSIS — F0150 Vascular dementia without behavioral disturbance: Secondary | ICD-10-CM | POA: Diagnosis not present

## 2017-03-02 DIAGNOSIS — I25810 Atherosclerosis of coronary artery bypass graft(s) without angina pectoris: Secondary | ICD-10-CM | POA: Diagnosis not present

## 2017-03-02 DIAGNOSIS — I672 Cerebral atherosclerosis: Secondary | ICD-10-CM | POA: Diagnosis not present

## 2017-03-02 DIAGNOSIS — N183 Chronic kidney disease, stage 3 (moderate): Secondary | ICD-10-CM | POA: Diagnosis not present

## 2017-03-02 DIAGNOSIS — I5030 Unspecified diastolic (congestive) heart failure: Secondary | ICD-10-CM | POA: Diagnosis not present

## 2017-03-04 DIAGNOSIS — I5030 Unspecified diastolic (congestive) heart failure: Secondary | ICD-10-CM | POA: Diagnosis not present

## 2017-03-04 DIAGNOSIS — I495 Sick sinus syndrome: Secondary | ICD-10-CM | POA: Diagnosis not present

## 2017-03-04 DIAGNOSIS — I672 Cerebral atherosclerosis: Secondary | ICD-10-CM | POA: Diagnosis not present

## 2017-03-04 DIAGNOSIS — N183 Chronic kidney disease, stage 3 (moderate): Secondary | ICD-10-CM | POA: Diagnosis not present

## 2017-03-04 DIAGNOSIS — I25810 Atherosclerosis of coronary artery bypass graft(s) without angina pectoris: Secondary | ICD-10-CM | POA: Diagnosis not present

## 2017-03-04 DIAGNOSIS — F0150 Vascular dementia without behavioral disturbance: Secondary | ICD-10-CM | POA: Diagnosis not present

## 2017-03-06 DIAGNOSIS — I5030 Unspecified diastolic (congestive) heart failure: Secondary | ICD-10-CM | POA: Diagnosis not present

## 2017-03-06 DIAGNOSIS — F0150 Vascular dementia without behavioral disturbance: Secondary | ICD-10-CM | POA: Diagnosis not present

## 2017-03-06 DIAGNOSIS — I25810 Atherosclerosis of coronary artery bypass graft(s) without angina pectoris: Secondary | ICD-10-CM | POA: Diagnosis not present

## 2017-03-06 DIAGNOSIS — I672 Cerebral atherosclerosis: Secondary | ICD-10-CM | POA: Diagnosis not present

## 2017-03-06 DIAGNOSIS — N183 Chronic kidney disease, stage 3 (moderate): Secondary | ICD-10-CM | POA: Diagnosis not present

## 2017-03-06 DIAGNOSIS — I495 Sick sinus syndrome: Secondary | ICD-10-CM | POA: Diagnosis not present

## 2017-03-09 ENCOUNTER — Ambulatory Visit (INDEPENDENT_AMBULATORY_CARE_PROVIDER_SITE_OTHER): Admitting: *Deleted

## 2017-03-09 DIAGNOSIS — I25810 Atherosclerosis of coronary artery bypass graft(s) without angina pectoris: Secondary | ICD-10-CM | POA: Diagnosis not present

## 2017-03-09 DIAGNOSIS — N183 Chronic kidney disease, stage 3 (moderate): Secondary | ICD-10-CM | POA: Diagnosis not present

## 2017-03-09 DIAGNOSIS — I672 Cerebral atherosclerosis: Secondary | ICD-10-CM | POA: Diagnosis not present

## 2017-03-09 DIAGNOSIS — I495 Sick sinus syndrome: Secondary | ICD-10-CM | POA: Diagnosis not present

## 2017-03-09 DIAGNOSIS — F0150 Vascular dementia without behavioral disturbance: Secondary | ICD-10-CM | POA: Diagnosis not present

## 2017-03-09 DIAGNOSIS — I441 Atrioventricular block, second degree: Secondary | ICD-10-CM | POA: Diagnosis not present

## 2017-03-09 DIAGNOSIS — I5030 Unspecified diastolic (congestive) heart failure: Secondary | ICD-10-CM | POA: Diagnosis not present

## 2017-03-10 DIAGNOSIS — I495 Sick sinus syndrome: Secondary | ICD-10-CM | POA: Diagnosis not present

## 2017-03-10 DIAGNOSIS — N183 Chronic kidney disease, stage 3 (moderate): Secondary | ICD-10-CM | POA: Diagnosis not present

## 2017-03-10 DIAGNOSIS — I672 Cerebral atherosclerosis: Secondary | ICD-10-CM | POA: Diagnosis not present

## 2017-03-10 DIAGNOSIS — I5030 Unspecified diastolic (congestive) heart failure: Secondary | ICD-10-CM | POA: Diagnosis not present

## 2017-03-10 DIAGNOSIS — I25810 Atherosclerosis of coronary artery bypass graft(s) without angina pectoris: Secondary | ICD-10-CM | POA: Diagnosis not present

## 2017-03-10 DIAGNOSIS — F0150 Vascular dementia without behavioral disturbance: Secondary | ICD-10-CM | POA: Diagnosis not present

## 2017-03-11 DIAGNOSIS — K219 Gastro-esophageal reflux disease without esophagitis: Secondary | ICD-10-CM | POA: Diagnosis not present

## 2017-03-11 DIAGNOSIS — R54 Age-related physical debility: Secondary | ICD-10-CM | POA: Diagnosis not present

## 2017-03-11 DIAGNOSIS — N401 Enlarged prostate with lower urinary tract symptoms: Secondary | ICD-10-CM | POA: Diagnosis not present

## 2017-03-11 DIAGNOSIS — I495 Sick sinus syndrome: Secondary | ICD-10-CM | POA: Diagnosis not present

## 2017-03-11 DIAGNOSIS — I10 Essential (primary) hypertension: Secondary | ICD-10-CM | POA: Diagnosis not present

## 2017-03-11 DIAGNOSIS — F339 Major depressive disorder, recurrent, unspecified: Secondary | ICD-10-CM | POA: Diagnosis not present

## 2017-03-11 DIAGNOSIS — I672 Cerebral atherosclerosis: Secondary | ICD-10-CM | POA: Diagnosis not present

## 2017-03-11 DIAGNOSIS — I5030 Unspecified diastolic (congestive) heart failure: Secondary | ICD-10-CM | POA: Diagnosis not present

## 2017-03-11 DIAGNOSIS — N3281 Overactive bladder: Secondary | ICD-10-CM | POA: Diagnosis not present

## 2017-03-11 DIAGNOSIS — F0150 Vascular dementia without behavioral disturbance: Secondary | ICD-10-CM | POA: Diagnosis not present

## 2017-03-11 DIAGNOSIS — F411 Generalized anxiety disorder: Secondary | ICD-10-CM | POA: Diagnosis not present

## 2017-03-11 DIAGNOSIS — R1310 Dysphagia, unspecified: Secondary | ICD-10-CM | POA: Diagnosis not present

## 2017-03-11 DIAGNOSIS — J309 Allergic rhinitis, unspecified: Secondary | ICD-10-CM | POA: Diagnosis not present

## 2017-03-11 DIAGNOSIS — E785 Hyperlipidemia, unspecified: Secondary | ICD-10-CM | POA: Diagnosis not present

## 2017-03-11 DIAGNOSIS — I25810 Atherosclerosis of coronary artery bypass graft(s) without angina pectoris: Secondary | ICD-10-CM | POA: Diagnosis not present

## 2017-03-11 DIAGNOSIS — N183 Chronic kidney disease, stage 3 (moderate): Secondary | ICD-10-CM | POA: Diagnosis not present

## 2017-03-11 NOTE — Progress Notes (Signed)
Remote pacemaker transmission.   

## 2017-03-14 DIAGNOSIS — N183 Chronic kidney disease, stage 3 (moderate): Secondary | ICD-10-CM | POA: Diagnosis not present

## 2017-03-14 DIAGNOSIS — I25810 Atherosclerosis of coronary artery bypass graft(s) without angina pectoris: Secondary | ICD-10-CM | POA: Diagnosis not present

## 2017-03-14 DIAGNOSIS — I495 Sick sinus syndrome: Secondary | ICD-10-CM | POA: Diagnosis not present

## 2017-03-14 DIAGNOSIS — I672 Cerebral atherosclerosis: Secondary | ICD-10-CM | POA: Diagnosis not present

## 2017-03-14 DIAGNOSIS — F0150 Vascular dementia without behavioral disturbance: Secondary | ICD-10-CM | POA: Diagnosis not present

## 2017-03-14 DIAGNOSIS — I5030 Unspecified diastolic (congestive) heart failure: Secondary | ICD-10-CM | POA: Diagnosis not present

## 2017-03-15 ENCOUNTER — Telehealth: Payer: Self-pay | Admitting: Cardiovascular Disease

## 2017-03-15 NOTE — Telephone Encounter (Signed)
Message sent to Dr.Croitoru for advice. 

## 2017-03-15 NOTE — Telephone Encounter (Signed)
New message     Per Daughter Should the patient keep the appointment in September he is in hospice at home , his dementia has advanced and he is 3rd degree AV block

## 2017-03-15 NOTE — Telephone Encounter (Signed)
No they do not need to keep that appointment. If they wish, we can do a remote pacemaker check. If they think he is too far along even for a remote download to make a difference, then we can cancel pacemaker checks altogether. Please wish them the best on my part. MCr

## 2017-03-16 DIAGNOSIS — N183 Chronic kidney disease, stage 3 (moderate): Secondary | ICD-10-CM | POA: Diagnosis not present

## 2017-03-16 DIAGNOSIS — I5030 Unspecified diastolic (congestive) heart failure: Secondary | ICD-10-CM | POA: Diagnosis not present

## 2017-03-16 DIAGNOSIS — F0150 Vascular dementia without behavioral disturbance: Secondary | ICD-10-CM | POA: Diagnosis not present

## 2017-03-16 DIAGNOSIS — I672 Cerebral atherosclerosis: Secondary | ICD-10-CM | POA: Diagnosis not present

## 2017-03-16 DIAGNOSIS — I495 Sick sinus syndrome: Secondary | ICD-10-CM | POA: Diagnosis not present

## 2017-03-16 DIAGNOSIS — I25810 Atherosclerosis of coronary artery bypass graft(s) without angina pectoris: Secondary | ICD-10-CM | POA: Diagnosis not present

## 2017-03-16 NOTE — Telephone Encounter (Signed)
Returned call to patient's daughter Mateo Flow 03/15/17. Dr.Croitoru's advice given.Dr.Croitoru's best wishes given.

## 2017-03-18 ENCOUNTER — Encounter: Payer: Self-pay | Admitting: Cardiology

## 2017-03-18 DIAGNOSIS — F0150 Vascular dementia without behavioral disturbance: Secondary | ICD-10-CM | POA: Diagnosis not present

## 2017-03-18 DIAGNOSIS — I672 Cerebral atherosclerosis: Secondary | ICD-10-CM | POA: Diagnosis not present

## 2017-03-18 DIAGNOSIS — N183 Chronic kidney disease, stage 3 (moderate): Secondary | ICD-10-CM | POA: Diagnosis not present

## 2017-03-18 DIAGNOSIS — I495 Sick sinus syndrome: Secondary | ICD-10-CM | POA: Diagnosis not present

## 2017-03-18 DIAGNOSIS — I25810 Atherosclerosis of coronary artery bypass graft(s) without angina pectoris: Secondary | ICD-10-CM | POA: Diagnosis not present

## 2017-03-18 DIAGNOSIS — I5030 Unspecified diastolic (congestive) heart failure: Secondary | ICD-10-CM | POA: Diagnosis not present

## 2017-03-21 DIAGNOSIS — I5030 Unspecified diastolic (congestive) heart failure: Secondary | ICD-10-CM | POA: Diagnosis not present

## 2017-03-21 DIAGNOSIS — I495 Sick sinus syndrome: Secondary | ICD-10-CM | POA: Diagnosis not present

## 2017-03-21 DIAGNOSIS — N183 Chronic kidney disease, stage 3 (moderate): Secondary | ICD-10-CM | POA: Diagnosis not present

## 2017-03-21 DIAGNOSIS — I672 Cerebral atherosclerosis: Secondary | ICD-10-CM | POA: Diagnosis not present

## 2017-03-21 DIAGNOSIS — I25810 Atherosclerosis of coronary artery bypass graft(s) without angina pectoris: Secondary | ICD-10-CM | POA: Diagnosis not present

## 2017-03-21 DIAGNOSIS — F0150 Vascular dementia without behavioral disturbance: Secondary | ICD-10-CM | POA: Diagnosis not present

## 2017-03-22 LAB — CUP PACEART REMOTE DEVICE CHECK
Battery Remaining Longevity: 109 mo
Battery Remaining Percentage: 95.5 %
Brady Statistic AP VS Percent: 9.3 %
Brady Statistic AS VS Percent: 6 %
Brady Statistic RA Percent Paced: 48 %
Date Time Interrogation Session: 20180530060013
Implantable Lead Implant Date: 20150326
Implantable Lead Implant Date: 20150326
Implantable Lead Location: 753860
Implantable Pulse Generator Implant Date: 20150326
Lead Channel Impedance Value: 490 Ohm
Lead Channel Impedance Value: 510 Ohm
Lead Channel Pacing Threshold Pulse Width: 0.5 ms
Lead Channel Sensing Intrinsic Amplitude: 2.2 mV
MDC IDC LEAD LOCATION: 753859
MDC IDC MSMT BATTERY VOLTAGE: 2.99 V
MDC IDC MSMT LEADCHNL RA PACING THRESHOLD AMPLITUDE: 0.75 V
MDC IDC MSMT LEADCHNL RV PACING THRESHOLD AMPLITUDE: 0.75 V
MDC IDC MSMT LEADCHNL RV PACING THRESHOLD PULSEWIDTH: 0.5 ms
MDC IDC MSMT LEADCHNL RV SENSING INTR AMPL: 12 mV
MDC IDC SET LEADCHNL RA PACING AMPLITUDE: 2 V
MDC IDC SET LEADCHNL RV PACING AMPLITUDE: 2.5 V
MDC IDC SET LEADCHNL RV PACING PULSEWIDTH: 0.5 ms
MDC IDC SET LEADCHNL RV SENSING SENSITIVITY: 4 mV
MDC IDC STAT BRADY AP VP PERCENT: 39 %
MDC IDC STAT BRADY AS VP PERCENT: 46 %
MDC IDC STAT BRADY RV PERCENT PACED: 85 %
Pulse Gen Model: 2240
Pulse Gen Serial Number: 7602208

## 2017-03-23 DIAGNOSIS — I672 Cerebral atherosclerosis: Secondary | ICD-10-CM | POA: Diagnosis not present

## 2017-03-23 DIAGNOSIS — I5030 Unspecified diastolic (congestive) heart failure: Secondary | ICD-10-CM | POA: Diagnosis not present

## 2017-03-23 DIAGNOSIS — F0150 Vascular dementia without behavioral disturbance: Secondary | ICD-10-CM | POA: Diagnosis not present

## 2017-03-23 DIAGNOSIS — I495 Sick sinus syndrome: Secondary | ICD-10-CM | POA: Diagnosis not present

## 2017-03-23 DIAGNOSIS — I25810 Atherosclerosis of coronary artery bypass graft(s) without angina pectoris: Secondary | ICD-10-CM | POA: Diagnosis not present

## 2017-03-23 DIAGNOSIS — N183 Chronic kidney disease, stage 3 (moderate): Secondary | ICD-10-CM | POA: Diagnosis not present

## 2017-03-24 DIAGNOSIS — I672 Cerebral atherosclerosis: Secondary | ICD-10-CM | POA: Diagnosis not present

## 2017-03-24 DIAGNOSIS — N183 Chronic kidney disease, stage 3 (moderate): Secondary | ICD-10-CM | POA: Diagnosis not present

## 2017-03-24 DIAGNOSIS — I495 Sick sinus syndrome: Secondary | ICD-10-CM | POA: Diagnosis not present

## 2017-03-24 DIAGNOSIS — I25810 Atherosclerosis of coronary artery bypass graft(s) without angina pectoris: Secondary | ICD-10-CM | POA: Diagnosis not present

## 2017-03-24 DIAGNOSIS — F0150 Vascular dementia without behavioral disturbance: Secondary | ICD-10-CM | POA: Diagnosis not present

## 2017-03-24 DIAGNOSIS — I5030 Unspecified diastolic (congestive) heart failure: Secondary | ICD-10-CM | POA: Diagnosis not present

## 2017-03-25 DIAGNOSIS — I495 Sick sinus syndrome: Secondary | ICD-10-CM | POA: Diagnosis not present

## 2017-03-25 DIAGNOSIS — I672 Cerebral atherosclerosis: Secondary | ICD-10-CM | POA: Diagnosis not present

## 2017-03-25 DIAGNOSIS — N183 Chronic kidney disease, stage 3 (moderate): Secondary | ICD-10-CM | POA: Diagnosis not present

## 2017-03-25 DIAGNOSIS — F0150 Vascular dementia without behavioral disturbance: Secondary | ICD-10-CM | POA: Diagnosis not present

## 2017-03-25 DIAGNOSIS — I25810 Atherosclerosis of coronary artery bypass graft(s) without angina pectoris: Secondary | ICD-10-CM | POA: Diagnosis not present

## 2017-03-25 DIAGNOSIS — I5030 Unspecified diastolic (congestive) heart failure: Secondary | ICD-10-CM | POA: Diagnosis not present

## 2017-03-28 DIAGNOSIS — I5030 Unspecified diastolic (congestive) heart failure: Secondary | ICD-10-CM | POA: Diagnosis not present

## 2017-03-28 DIAGNOSIS — I495 Sick sinus syndrome: Secondary | ICD-10-CM | POA: Diagnosis not present

## 2017-03-28 DIAGNOSIS — I25810 Atherosclerosis of coronary artery bypass graft(s) without angina pectoris: Secondary | ICD-10-CM | POA: Diagnosis not present

## 2017-03-28 DIAGNOSIS — I672 Cerebral atherosclerosis: Secondary | ICD-10-CM | POA: Diagnosis not present

## 2017-03-28 DIAGNOSIS — N183 Chronic kidney disease, stage 3 (moderate): Secondary | ICD-10-CM | POA: Diagnosis not present

## 2017-03-28 DIAGNOSIS — F0150 Vascular dementia without behavioral disturbance: Secondary | ICD-10-CM | POA: Diagnosis not present

## 2017-03-30 DIAGNOSIS — I495 Sick sinus syndrome: Secondary | ICD-10-CM | POA: Diagnosis not present

## 2017-03-30 DIAGNOSIS — F0150 Vascular dementia without behavioral disturbance: Secondary | ICD-10-CM | POA: Diagnosis not present

## 2017-03-30 DIAGNOSIS — I5030 Unspecified diastolic (congestive) heart failure: Secondary | ICD-10-CM | POA: Diagnosis not present

## 2017-03-30 DIAGNOSIS — I672 Cerebral atherosclerosis: Secondary | ICD-10-CM | POA: Diagnosis not present

## 2017-03-30 DIAGNOSIS — N183 Chronic kidney disease, stage 3 (moderate): Secondary | ICD-10-CM | POA: Diagnosis not present

## 2017-03-30 DIAGNOSIS — I25810 Atherosclerosis of coronary artery bypass graft(s) without angina pectoris: Secondary | ICD-10-CM | POA: Diagnosis not present

## 2017-04-01 DIAGNOSIS — I495 Sick sinus syndrome: Secondary | ICD-10-CM | POA: Diagnosis not present

## 2017-04-01 DIAGNOSIS — F0150 Vascular dementia without behavioral disturbance: Secondary | ICD-10-CM | POA: Diagnosis not present

## 2017-04-01 DIAGNOSIS — I25810 Atherosclerosis of coronary artery bypass graft(s) without angina pectoris: Secondary | ICD-10-CM | POA: Diagnosis not present

## 2017-04-01 DIAGNOSIS — N183 Chronic kidney disease, stage 3 (moderate): Secondary | ICD-10-CM | POA: Diagnosis not present

## 2017-04-01 DIAGNOSIS — I5030 Unspecified diastolic (congestive) heart failure: Secondary | ICD-10-CM | POA: Diagnosis not present

## 2017-04-01 DIAGNOSIS — I672 Cerebral atherosclerosis: Secondary | ICD-10-CM | POA: Diagnosis not present

## 2017-04-04 DIAGNOSIS — I672 Cerebral atherosclerosis: Secondary | ICD-10-CM | POA: Diagnosis not present

## 2017-04-04 DIAGNOSIS — I495 Sick sinus syndrome: Secondary | ICD-10-CM | POA: Diagnosis not present

## 2017-04-04 DIAGNOSIS — I5030 Unspecified diastolic (congestive) heart failure: Secondary | ICD-10-CM | POA: Diagnosis not present

## 2017-04-04 DIAGNOSIS — N183 Chronic kidney disease, stage 3 (moderate): Secondary | ICD-10-CM | POA: Diagnosis not present

## 2017-04-04 DIAGNOSIS — F0150 Vascular dementia without behavioral disturbance: Secondary | ICD-10-CM | POA: Diagnosis not present

## 2017-04-04 DIAGNOSIS — I25810 Atherosclerosis of coronary artery bypass graft(s) without angina pectoris: Secondary | ICD-10-CM | POA: Diagnosis not present

## 2017-04-06 DIAGNOSIS — I495 Sick sinus syndrome: Secondary | ICD-10-CM | POA: Diagnosis not present

## 2017-04-06 DIAGNOSIS — I5030 Unspecified diastolic (congestive) heart failure: Secondary | ICD-10-CM | POA: Diagnosis not present

## 2017-04-06 DIAGNOSIS — I25810 Atherosclerosis of coronary artery bypass graft(s) without angina pectoris: Secondary | ICD-10-CM | POA: Diagnosis not present

## 2017-04-06 DIAGNOSIS — N183 Chronic kidney disease, stage 3 (moderate): Secondary | ICD-10-CM | POA: Diagnosis not present

## 2017-04-06 DIAGNOSIS — I672 Cerebral atherosclerosis: Secondary | ICD-10-CM | POA: Diagnosis not present

## 2017-04-06 DIAGNOSIS — F0150 Vascular dementia without behavioral disturbance: Secondary | ICD-10-CM | POA: Diagnosis not present

## 2017-04-08 DIAGNOSIS — I495 Sick sinus syndrome: Secondary | ICD-10-CM | POA: Diagnosis not present

## 2017-04-08 DIAGNOSIS — F0150 Vascular dementia without behavioral disturbance: Secondary | ICD-10-CM | POA: Diagnosis not present

## 2017-04-08 DIAGNOSIS — N183 Chronic kidney disease, stage 3 (moderate): Secondary | ICD-10-CM | POA: Diagnosis not present

## 2017-04-08 DIAGNOSIS — I672 Cerebral atherosclerosis: Secondary | ICD-10-CM | POA: Diagnosis not present

## 2017-04-08 DIAGNOSIS — I25810 Atherosclerosis of coronary artery bypass graft(s) without angina pectoris: Secondary | ICD-10-CM | POA: Diagnosis not present

## 2017-04-08 DIAGNOSIS — I5030 Unspecified diastolic (congestive) heart failure: Secondary | ICD-10-CM | POA: Diagnosis not present

## 2017-04-10 DIAGNOSIS — R1310 Dysphagia, unspecified: Secondary | ICD-10-CM | POA: Diagnosis not present

## 2017-04-10 DIAGNOSIS — I495 Sick sinus syndrome: Secondary | ICD-10-CM | POA: Diagnosis not present

## 2017-04-10 DIAGNOSIS — J309 Allergic rhinitis, unspecified: Secondary | ICD-10-CM | POA: Diagnosis not present

## 2017-04-10 DIAGNOSIS — F339 Major depressive disorder, recurrent, unspecified: Secondary | ICD-10-CM | POA: Diagnosis not present

## 2017-04-10 DIAGNOSIS — N183 Chronic kidney disease, stage 3 (moderate): Secondary | ICD-10-CM | POA: Diagnosis not present

## 2017-04-10 DIAGNOSIS — F411 Generalized anxiety disorder: Secondary | ICD-10-CM | POA: Diagnosis not present

## 2017-04-10 DIAGNOSIS — E785 Hyperlipidemia, unspecified: Secondary | ICD-10-CM | POA: Diagnosis not present

## 2017-04-10 DIAGNOSIS — N3281 Overactive bladder: Secondary | ICD-10-CM | POA: Diagnosis not present

## 2017-04-10 DIAGNOSIS — I25810 Atherosclerosis of coronary artery bypass graft(s) without angina pectoris: Secondary | ICD-10-CM | POA: Diagnosis not present

## 2017-04-10 DIAGNOSIS — I10 Essential (primary) hypertension: Secondary | ICD-10-CM | POA: Diagnosis not present

## 2017-04-10 DIAGNOSIS — I5030 Unspecified diastolic (congestive) heart failure: Secondary | ICD-10-CM | POA: Diagnosis not present

## 2017-04-10 DIAGNOSIS — K219 Gastro-esophageal reflux disease without esophagitis: Secondary | ICD-10-CM | POA: Diagnosis not present

## 2017-04-10 DIAGNOSIS — N401 Enlarged prostate with lower urinary tract symptoms: Secondary | ICD-10-CM | POA: Diagnosis not present

## 2017-04-10 DIAGNOSIS — F0150 Vascular dementia without behavioral disturbance: Secondary | ICD-10-CM | POA: Diagnosis not present

## 2017-04-10 DIAGNOSIS — R54 Age-related physical debility: Secondary | ICD-10-CM | POA: Diagnosis not present

## 2017-04-10 DIAGNOSIS — I672 Cerebral atherosclerosis: Secondary | ICD-10-CM | POA: Diagnosis not present

## 2017-04-19 ENCOUNTER — Inpatient Hospital Stay (HOSPITAL_COMMUNITY)
Admission: EM | Admit: 2017-04-19 | Discharge: 2017-04-20 | DRG: 871 | Disposition: A | Discharge status: Hospice | Attending: Internal Medicine | Admitting: Internal Medicine

## 2017-04-19 ENCOUNTER — Encounter (HOSPITAL_COMMUNITY): Payer: Self-pay

## 2017-04-19 ENCOUNTER — Emergency Department (HOSPITAL_COMMUNITY)

## 2017-04-19 DIAGNOSIS — Z7951 Long term (current) use of inhaled steroids: Secondary | ICD-10-CM

## 2017-04-19 DIAGNOSIS — I5032 Chronic diastolic (congestive) heart failure: Secondary | ICD-10-CM | POA: Diagnosis present

## 2017-04-19 DIAGNOSIS — Z950 Presence of cardiac pacemaker: Secondary | ICD-10-CM

## 2017-04-19 DIAGNOSIS — Z515 Encounter for palliative care: Secondary | ICD-10-CM | POA: Diagnosis present

## 2017-04-19 DIAGNOSIS — Z66 Do not resuscitate: Secondary | ICD-10-CM | POA: Diagnosis present

## 2017-04-19 DIAGNOSIS — G309 Alzheimer's disease, unspecified: Secondary | ICD-10-CM | POA: Diagnosis present

## 2017-04-19 DIAGNOSIS — I10 Essential (primary) hypertension: Secondary | ICD-10-CM | POA: Diagnosis present

## 2017-04-19 DIAGNOSIS — N183 Chronic kidney disease, stage 3 unspecified: Secondary | ICD-10-CM | POA: Diagnosis present

## 2017-04-19 DIAGNOSIS — T148XXA Other injury of unspecified body region, initial encounter: Secondary | ICD-10-CM | POA: Diagnosis not present

## 2017-04-19 DIAGNOSIS — G40909 Epilepsy, unspecified, not intractable, without status epilepticus: Secondary | ICD-10-CM | POA: Diagnosis present

## 2017-04-19 DIAGNOSIS — S4992XA Unspecified injury of left shoulder and upper arm, initial encounter: Secondary | ICD-10-CM | POA: Diagnosis not present

## 2017-04-19 DIAGNOSIS — Z85828 Personal history of other malignant neoplasm of skin: Secondary | ICD-10-CM

## 2017-04-19 DIAGNOSIS — J189 Pneumonia, unspecified organism: Secondary | ICD-10-CM | POA: Diagnosis present

## 2017-04-19 DIAGNOSIS — K219 Gastro-esophageal reflux disease without esophagitis: Secondary | ICD-10-CM | POA: Diagnosis present

## 2017-04-19 DIAGNOSIS — Z87891 Personal history of nicotine dependence: Secondary | ICD-10-CM

## 2017-04-19 DIAGNOSIS — A419 Sepsis, unspecified organism: Principal | ICD-10-CM | POA: Diagnosis present

## 2017-04-19 DIAGNOSIS — F429 Obsessive-compulsive disorder, unspecified: Secondary | ICD-10-CM | POA: Diagnosis present

## 2017-04-19 DIAGNOSIS — E785 Hyperlipidemia, unspecified: Secondary | ICD-10-CM | POA: Diagnosis present

## 2017-04-19 DIAGNOSIS — Z8249 Family history of ischemic heart disease and other diseases of the circulatory system: Secondary | ICD-10-CM

## 2017-04-19 DIAGNOSIS — R05 Cough: Secondary | ICD-10-CM | POA: Diagnosis not present

## 2017-04-19 DIAGNOSIS — Z7982 Long term (current) use of aspirin: Secondary | ICD-10-CM

## 2017-04-19 DIAGNOSIS — F0280 Dementia in other diseases classified elsewhere without behavioral disturbance: Secondary | ICD-10-CM | POA: Diagnosis present

## 2017-04-19 DIAGNOSIS — S40212A Abrasion of left shoulder, initial encounter: Secondary | ICD-10-CM | POA: Diagnosis present

## 2017-04-19 DIAGNOSIS — I130 Hypertensive heart and chronic kidney disease with heart failure and stage 1 through stage 4 chronic kidney disease, or unspecified chronic kidney disease: Secondary | ICD-10-CM | POA: Diagnosis present

## 2017-04-19 DIAGNOSIS — W19XXXA Unspecified fall, initial encounter: Secondary | ICD-10-CM | POA: Diagnosis present

## 2017-04-19 DIAGNOSIS — Z823 Family history of stroke: Secondary | ICD-10-CM

## 2017-04-19 LAB — BASIC METABOLIC PANEL WITH GFR
Anion gap: 7 (ref 5–15)
BUN: 21 mg/dL — ABNORMAL HIGH (ref 6–20)
CO2: 27 mmol/L (ref 22–32)
Calcium: 8.6 mg/dL — ABNORMAL LOW (ref 8.9–10.3)
Chloride: 104 mmol/L (ref 101–111)
Creatinine, Ser: 1.27 mg/dL — ABNORMAL HIGH (ref 0.61–1.24)
GFR calc Af Amer: 54 mL/min — ABNORMAL LOW
GFR calc non Af Amer: 47 mL/min — ABNORMAL LOW
Glucose, Bld: 157 mg/dL — ABNORMAL HIGH (ref 65–99)
Potassium: 4 mmol/L (ref 3.5–5.1)
Sodium: 138 mmol/L (ref 135–145)

## 2017-04-19 LAB — CBC WITH DIFFERENTIAL/PLATELET
Basophils Absolute: 0 10*3/uL (ref 0.0–0.1)
Basophils Relative: 0 %
Eosinophils Absolute: 0 10*3/uL (ref 0.0–0.7)
Eosinophils Relative: 0 %
HCT: 36.8 % — ABNORMAL LOW (ref 39.0–52.0)
Hemoglobin: 12.5 g/dL — ABNORMAL LOW (ref 13.0–17.0)
Lymphocytes Relative: 9 %
Lymphs Abs: 1.3 10*3/uL (ref 0.7–4.0)
MCH: 31.5 pg (ref 26.0–34.0)
MCHC: 34 g/dL (ref 30.0–36.0)
MCV: 92.7 fL (ref 78.0–100.0)
Monocytes Absolute: 1.7 10*3/uL — ABNORMAL HIGH (ref 0.1–1.0)
Monocytes Relative: 11 %
Neutro Abs: 11.4 10*3/uL — ABNORMAL HIGH (ref 1.7–7.7)
Neutrophils Relative %: 80 %
Platelets: 182 10*3/uL (ref 150–400)
RBC: 3.97 MIL/uL — ABNORMAL LOW (ref 4.22–5.81)
RDW: 16.1 % — ABNORMAL HIGH (ref 11.5–15.5)
WBC: 14.4 10*3/uL — ABNORMAL HIGH (ref 4.0–10.5)

## 2017-04-19 NOTE — ED Notes (Signed)
Bed: WA08 Expected date:  Expected time:  Means of arrival:  Comments: EMS 83 yo male from home/sore throat and cough x 4 hours-hospice patient-severe dementia/pacer

## 2017-04-19 NOTE — ED Provider Notes (Signed)
[facility] DEPT Provider Note   CSN: [id number] Arrival date & time: 04/19/17  2112     History   Chief Complaint Chief Complaint  Patient presents with  . Cough  . Sore Throat    HPI Daniel [name] is a 83 y.o. male.  Patient with PMH of 3rd degree AV block, dementia, on Hospice, presents to the ED with a chief complaint of cough.  He is unable to contribute to his history.  History is provided via the daughter.  Patient's daughter states that he fell earlier today, and has not walked since.  She states that he has also been having a thick productive cough today.  She states that he has also not been eating or drinking today.  There are no other associated symptoms or modifying factors.   The history is provided by the patient. No language interpreter was used.    Past Medical History:  Diagnosis Date  . Allergy   . Anxiety   . Cataract   . Degenerative arthritis   . Diverticulitis   . Diverticulitis   . Dyslipidemia   . Dysphagia    Mild  . Gastroesophageal reflux disease   . Hearing difficulty    bilateral hearing aids  . Hearing loss   . Hypertension   . Left bundle branch block   . Memory loss   . Nocturnal leg cramps   . OCD (obsessive compulsive disorder)   . Seizures ([place])   . Substance abuse   . Ulcer     Patient Active Problem List   Diagnosis Date Noted  . Chronic diastolic heart failure ([place]) 03/06/2016  . Dementia 03/06/2016  . GAD (generalized anxiety disorder) 09/04/2015  . Syncope and collapse 01/04/2014  . Cardiac pacemaker implanted- St Jude - 01/03/14 01/04/2014  . Acute diastolic heart failure ([place]) 01/04/2014  . Asthma 01/02/2014  . Heart block 01/02/2014  . Heart block AV second degree 01/02/2014  . BPH (benign prostatic hyperplasia) 01/24/2013  . GERD (gastroesophageal reflux disease) 01/24/2013  . HTN (hypertension) 01/24/2013  . Other and unspecified hyperlipidemia 01/24/2013  . Diverticular disease of colon 01/24/2013  .  Alzheimer's disease 01/03/2013    Past Surgical History:  Procedure Laterality Date  . BASAL CELL CARCINOMA EXCISION    . CATARACT EXTRACTION Bilateral   . EYE SURGERY    . PERMANENT PACEMAKER INSERTION N/A 01/03/2014   Procedure: PERMANENT PACEMAKER INSERTION;  Surgeon: Sanda Klein, MD;  Location: [facility] CATH [facility];  Service: Cardiovascular;  Laterality: N/A;  . PROSTATE SURGERY    . TONSILLECTOMY    . TURP VAPORIZATION         Home Medications    Prior to Admission medications   Medication Sig Start Date End Date Taking? Authorizing Provider  aspirin 81 MG tablet Take 81 mg by mouth daily.    [provider]  cholecalciferol (VITAMIN D) 1000 UNITS tablet Take 2,000 Units by mouth daily.     [provider]  donepezil (ARICEPT) 5 MG tablet TAKE 1 TABLET (5 MG TOTAL) BY MOUTH 2 (TWO) TIMES DAILY. 08/24/16   Kathrynn Ducking, MD  doxycycline (VIBRAMYCIN) 100 MG capsule Take 1 capsule (100 mg total) by mouth 2 (two) times daily. 12/25/16   Quintella Reichert, MD  fluticasone [facility]) 50 MCG/ACT nasal spray Place 2 sprays into both nostrils daily. 02/17/16   Leandrew Koyanagi, MD  memantine (NAMENDA) 10 MG tablet TAKE 1 TABLET BY MOUTH TWICE DAILY 01/11/17   Kathrynn Ducking,  MD  mirabegron ER (MYRBETRIQ) 50 MG TB24 tablet Take 50 mg by mouth daily.    [provider]  Multiple Vitamins-Minerals (PRESERVISION AREDS) TABS Take 1 tablet by mouth daily.    [provider]  olopatadine (PATANOL) 0.1 % ophthalmic solution PLACE 1 DROP INTO BOTH EYES 2 (TWO) TIMES DAILY. 09/17/15   Leandrew Koyanagi, MD  omeprazole (PRILOSEC) 20 MG capsule Take 1 capsule (20 mg total) by mouth daily. Take in morning on empty stomach. 10/08/15   Leandrew Koyanagi, MD  sertraline (ZOLOFT) 100 MG tablet Take 1.5 tablets (150 mg total) by mouth daily. 10/20/16   Kathrynn Ducking, MD  triamcinolone cream (KENALOG) 0.1 % Apply 1 application topically as needed. Apply to affected area  x 14 days 09/14/16   [provider]    Family History Family History  Problem Relation Age of Onset  . Heart attack Mother   . Dementia Father   . Heart attack Sister   . Heart disease Sister   . Heart attack Brother   . Heart attack Sister   . Heart attack Brother   . Stroke Brother   . Heart disease Daughter   . Heart disease Paternal Grandmother     Social History Social History  Substance Use Topics  . Smoking status: Former Research scientist (life sciences)  . Smokeless tobacco: Never Used  . Alcohol use No     Comment: History of alcoholism, no longer drinking     Allergies   Sanctura [trospium] and Biaxin [clarithromycin]   Review of Systems Review of Systems  All other systems reviewed and are negative.    Physical Exam Updated Vital Signs BP (!) 137/53 (BP Location: Left Arm)   Pulse 70   Temp 99.8 F (37.7 C) (Oral)   Resp 18   SpO2 90%   Physical Exam  Constitutional: He is oriented to person, place, and time. He appears well-developed and well-nourished.  HENT:  Head: Normocephalic and atraumatic.  Eyes: Conjunctivae and EOM are normal. Pupils are equal, round, and reactive to light. Right eye exhibits no discharge. Left eye exhibits no discharge. No scleral icterus.  Neck: Normal range of motion. Neck supple. No JVD present.  Cardiovascular: Normal rate, regular rhythm and normal heart sounds.  Exam reveals no gallop and no friction rub.   No murmur heard. Pulmonary/Chest: Effort normal and breath sounds normal. No respiratory distress. He has no wheezes. He has no rales. He exhibits no tenderness.  Abdominal: Soft. He exhibits no distension and no mass. There is no tenderness. There is no rebound and no guarding.  Musculoskeletal: Normal range of motion. He exhibits no edema or tenderness.  Neurological: He is alert and oriented to person, place, and time.  Skin: Skin is warm and dry.  Psychiatric: He has a normal mood and affect. His behavior is normal.  Judgment and thought content normal.  Nursing note and vitals reviewed.    ED Treatments / Results  Labs (all labs ordered are listed, but only abnormal results are displayed) Labs Reviewed  CBC WITH DIFFERENTIAL/PLATELET  BASIC METABOLIC PANEL    EKG  EKG Interpretation None       Radiology Dg Chest 2 View  Result Date: 04/19/2017 CLINICAL DATA:  83 year old male with cough. EXAM: CHEST  2 VIEW COMPARISON:  Chest CT dated 12/25/2016 FINDINGS: Bibasilar streaky densities, likely atelectatic changes. Developing infiltrate is less likely. Clinical correlation is recommended. There is no focal consolidation, pleural effusion, or pneumothorax. The cardiac silhouette  is within normal limits. Left pectoral pacemaker device. No acute osseous pathology. IMPRESSION: Bilateral mid to lower lung field streaky densities may represent atelectatic changes. Developing infiltrate or atypical pneumonia is not excluded. Clinical correlation is recommended. No focal consolidation. Electronically Signed   By: Anner Crete M.D.   On: 04/19/2017 22:21    Procedures Procedures (including critical care time)  Medications Ordered in ED Medications - No data to display   Initial Impression / Assessment and Plan / ED Course  I have reviewed the triage vital signs and the nursing notes.  Pertinent labs & imaging results that were available during my care of the patient were reviewed by me and considered in my medical decision making (see chart for details).     Dr. Randal Buba and myself had a long conversation with the daughter who is the healthcare POA.  She does not want antibiotics given, and states that they are ready to "let him go," but expresses concern about this happening in her home.  She would like for the patient to be able to go to an inpatient facility such as [facility].  I discussed the patient with Tiffany, RN, from Palliative Care, who will have the morning Palliative Care team come  to see the patient and family member.  At this time, per the healthcare POA, the plan is for observation for comfort care, NO antibiotics.  Patient is DNR.  The daughter acknowledges that by not treating the pneumonia, this will likely lead to the patient's death.  She states that "he has suffered so much."  She states that they are ready to "let him go."  She and her sister decided back in March that if their father got pneumonia again that they would NOT treat.  This patient and family member were seen by and discussed with Dr. Randal Buba, who agrees with the plan.  I discussed the case with Dr. Blaine Hamper, who will bring patient in for comfort care.  Patient to be seen by Palliative Care in the morning.  Final Clinical Impressions(s) / ED Diagnoses   Final diagnoses:  Community acquired pneumonia, unspecified laterality    New Prescriptions New Prescriptions   No medications on file     Montine Circle, Hershal Coria 04/20/17 [place], April, MD 04/20/17 [phone]

## 2017-04-19 NOTE — ED Triage Notes (Signed)
Ems arrives with a 83 yo male from home-called EMS by his daughter for complaints of a cough with mucous. Patient is a Hospice patient and was seen by the Hospice nurse today. BP 115/48. HR 74/pacer-RR16 and unlabored. CBG 131. Temp 98.4 O2 sat 96% RA. Fall earlier today but was evaluated by Hospice RN after fall-no need for transport. Small abrasion left shoulder.

## 2017-04-20 ENCOUNTER — Encounter (HOSPITAL_COMMUNITY): Payer: Self-pay | Admitting: Emergency Medicine

## 2017-04-20 DIAGNOSIS — Z823 Family history of stroke: Secondary | ICD-10-CM | POA: Diagnosis not present

## 2017-04-20 DIAGNOSIS — W19XXXD Unspecified fall, subsequent encounter: Secondary | ICD-10-CM

## 2017-04-20 DIAGNOSIS — Z87891 Personal history of nicotine dependence: Secondary | ICD-10-CM | POA: Diagnosis not present

## 2017-04-20 DIAGNOSIS — G40909 Epilepsy, unspecified, not intractable, without status epilepticus: Secondary | ICD-10-CM | POA: Diagnosis present

## 2017-04-20 DIAGNOSIS — J189 Pneumonia, unspecified organism: Secondary | ICD-10-CM | POA: Diagnosis not present

## 2017-04-20 DIAGNOSIS — I495 Sick sinus syndrome: Secondary | ICD-10-CM | POA: Diagnosis not present

## 2017-04-20 DIAGNOSIS — Z85828 Personal history of other malignant neoplasm of skin: Secondary | ICD-10-CM | POA: Diagnosis not present

## 2017-04-20 DIAGNOSIS — G309 Alzheimer's disease, unspecified: Secondary | ICD-10-CM | POA: Diagnosis present

## 2017-04-20 DIAGNOSIS — G301 Alzheimer's disease with late onset: Secondary | ICD-10-CM

## 2017-04-20 DIAGNOSIS — Z7951 Long term (current) use of inhaled steroids: Secondary | ICD-10-CM | POA: Diagnosis not present

## 2017-04-20 DIAGNOSIS — A419 Sepsis, unspecified organism: Principal | ICD-10-CM | POA: Diagnosis present

## 2017-04-20 DIAGNOSIS — W19XXXA Unspecified fall, initial encounter: Secondary | ICD-10-CM

## 2017-04-20 DIAGNOSIS — Z950 Presence of cardiac pacemaker: Secondary | ICD-10-CM | POA: Diagnosis not present

## 2017-04-20 DIAGNOSIS — R05 Cough: Secondary | ICD-10-CM | POA: Diagnosis present

## 2017-04-20 DIAGNOSIS — E785 Hyperlipidemia, unspecified: Secondary | ICD-10-CM | POA: Diagnosis present

## 2017-04-20 DIAGNOSIS — N183 Chronic kidney disease, stage 3 unspecified: Secondary | ICD-10-CM | POA: Diagnosis present

## 2017-04-20 DIAGNOSIS — I130 Hypertensive heart and chronic kidney disease with heart failure and stage 1 through stage 4 chronic kidney disease, or unspecified chronic kidney disease: Secondary | ICD-10-CM | POA: Diagnosis present

## 2017-04-20 DIAGNOSIS — Z66 Do not resuscitate: Secondary | ICD-10-CM | POA: Diagnosis present

## 2017-04-20 DIAGNOSIS — Z515 Encounter for palliative care: Secondary | ICD-10-CM | POA: Diagnosis present

## 2017-04-20 DIAGNOSIS — F0280 Dementia in other diseases classified elsewhere without behavioral disturbance: Secondary | ICD-10-CM

## 2017-04-20 DIAGNOSIS — I25810 Atherosclerosis of coronary artery bypass graft(s) without angina pectoris: Secondary | ICD-10-CM | POA: Diagnosis not present

## 2017-04-20 DIAGNOSIS — K219 Gastro-esophageal reflux disease without esophagitis: Secondary | ICD-10-CM

## 2017-04-20 DIAGNOSIS — I5030 Unspecified diastolic (congestive) heart failure: Secondary | ICD-10-CM | POA: Diagnosis not present

## 2017-04-20 DIAGNOSIS — I5032 Chronic diastolic (congestive) heart failure: Secondary | ICD-10-CM | POA: Diagnosis present

## 2017-04-20 DIAGNOSIS — I10 Essential (primary) hypertension: Secondary | ICD-10-CM

## 2017-04-20 DIAGNOSIS — F0150 Vascular dementia without behavioral disturbance: Secondary | ICD-10-CM | POA: Diagnosis not present

## 2017-04-20 DIAGNOSIS — S40212A Abrasion of left shoulder, initial encounter: Secondary | ICD-10-CM | POA: Diagnosis present

## 2017-04-20 DIAGNOSIS — F429 Obsessive-compulsive disorder, unspecified: Secondary | ICD-10-CM | POA: Diagnosis present

## 2017-04-20 DIAGNOSIS — I672 Cerebral atherosclerosis: Secondary | ICD-10-CM | POA: Diagnosis not present

## 2017-04-20 DIAGNOSIS — Z8249 Family history of ischemic heart disease and other diseases of the circulatory system: Secondary | ICD-10-CM | POA: Diagnosis not present

## 2017-04-20 DIAGNOSIS — Z7982 Long term (current) use of aspirin: Secondary | ICD-10-CM | POA: Diagnosis not present

## 2017-04-20 MED ORDER — FENTANYL CITRATE (PF) 100 MCG/2ML IJ SOLN
100.0000 ug | Freq: Once | INTRAMUSCULAR | Status: DC
  Filled 2017-04-20 (×2): qty 2

## 2017-04-20 MED ORDER — MORPHINE SULFATE (PF) 2 MG/ML IV SOLN
1.0000 mg | INTRAVENOUS | Status: DC | PRN
  Administered 2017-04-20: 1 mg via INTRAVENOUS
  Filled 2017-04-20: qty 1

## 2017-04-20 MED ORDER — MORPHINE SULFATE (PF) 2 MG/ML IV SOLN: 1.0000 mg | INTRAVENOUS | Status: DC | PRN

## 2017-04-20 MED ORDER — SODIUM CHLORIDE 0.9 % IV SOLN
INTRAVENOUS | Status: DC
  Administered 2017-04-20: 03:00:00 via INTRAVENOUS

## 2017-04-20 MED ORDER — LORAZEPAM 2 MG/ML IJ SOLN: 0.5000 mg | INTRAMUSCULAR | Status: DC | PRN

## 2017-04-20 MED ORDER — METHYLPREDNISOLONE SODIUM SUCC 40 MG IJ SOLR
40.0000 mg | Freq: Once | INTRAMUSCULAR | Status: AC
  Administered 2017-04-20: 40 mg via INTRAVENOUS
  Filled 2017-04-20: qty 1

## 2017-04-20 MED ORDER — MORPHINE SULFATE (PF) 2 MG/ML IV SOLN: 1.0000 mg | INTRAVENOUS | 0 refills | Status: AC | PRN

## 2017-04-20 MED ORDER — ACETAMINOPHEN 650 MG RE SUPP
650.0000 mg | RECTAL | Status: DC | PRN
  Administered 2017-04-20: 650 mg via RECTAL
  Filled 2017-04-20: qty 1

## 2017-04-20 MED ORDER — LORAZEPAM 2 MG/ML IJ SOLN: 0.5000 mg | INTRAMUSCULAR | 0 refills | Status: AC | PRN

## 2017-04-20 MED ORDER — ACETAMINOPHEN 650 MG RE SUPP: 650.0000 mg | RECTAL | 0 refills | Status: AC | PRN

## 2017-04-20 NOTE — Progress Notes (Signed)
Pt is ready to dc to [facility]. Family is in agreement with this plan. EMS transport is needed. Medical necessity form completed. D/C summary sent to facility for review. No scripts required. # for report provided to nsg.  Roselyn Reef Riaz [place]ato LCSW [phone]

## 2017-04-20 NOTE — Progress Notes (Signed)
Plan for d/c to [facility], discharge planning per CSW. [phone]

## 2017-04-20 NOTE — Clinical Social Work Note (Signed)
Clinical Social Work Assessment  Patient Details  Name: Daniel [name] MRN: [id number] Date of Birth: Mar 03, 1921  Date of referral:  04/20/17               Reason for consult:  End of Life/Hospice                Permission sought to share information with:  Chartered certified accountant granted to share information::  Yes, Verbal Permission Granted  Name::        Agency::     Relationship::     Contact Information:     Housing/Transportation Living arrangements for the past 2 months:  Single Family Home Source of Information:  Adult Children Patient Interpreter Needed:  None Criminal Activity/Legal Involvement Pertinent to Current Situation/Hospitalization:  No - Comment as needed Significant Relationships:  Adult Children, Other Family Members Lives with:  Adult Children Do you feel safe going back to the place where you live?   (Residential Hospice Home placement needed.) Need for family participation in patient care:  Yes (Comment)  Care giving concerns:  [name] can no longer be managed at home.   Social Worker assessment / plan:  Pt hospitalized from home on 04/19/09 with CAP. CSW met with pt's daughter at bedside. Pt sleeping on / off during visit. Daughter reports that [facility] has been providing support at home. Daughter reports that she can no longer manage pt's care at home. Residential hospice Home placement requested at [facility]. Referral provided and pt has been accepted for placement today. CSW will continue to follow to assist with d/c planning to [facility].  Employment status:  Retired Forensic scientist:  Medicare PT Recommendations:  Not assessed at this time Information / Referral to community resources:     Patient/Family's Response to care:  Daughter is pleased [facility] has an opening .  Patient/Family's Understanding of and Emotional Response to Diagnosis, Current Treatment, and Prognosis:  Daughter is aware of pt's poor prognosis.  Daughter appreciates hospice and hospital assistance with pt's care. " Everyone that I have come in contact with has been so kind. "  Emotional Assessment Appearance:  Appears stated age Attitude/Demeanor/Rapport:  Unable to Assess Affect (typically observed):  Unable to Assess Orientation:   (Disoriented x4) Alcohol / Substance use:  Not Applicable Psych involvement (Current and /or in the community):  No (Comment)  Discharge Needs  Concerns to be addressed:  Discharge Planning Concerns Readmission within the last 30 days:  No Current discharge risk:  None Barriers to Discharge:      Loraine Maple  [phone] 04/20/2017, 11:32 AM

## 2017-04-20 NOTE — Progress Notes (Signed)
[facility]25- [facility] [place]-GIP RN Visit  This is a related and covered GIP admission of 04/20/2017 with [facility] diagnosis of Cerebrovascular Atherosclerosis per Dr. Jenene Slicker. Patent has an OOF DNR. Family activated EMS and brought patient to [facility] ED for complaints of cough with mucus and falls. Patient admitted for pneumonia . Hospice was notified.   Met with patient and daughter. Patient appears to be resting comfortably and in no distress.  Patient slept during visit. Daughter states she does not want patient treated with antibiotics. Daughter states she wants to transition patient to full comfort care.    Medications: NS_0 Francee Gentile. Morphine 28m IV PRN and Tylenol 650 suppository for pain.   Foley catheter draining clear yellow urine.   Notified Dr. HJenene Slickerand Dr.Bouska of patient admission. Placed copy of transfer summary and medication list on shadow chart.    Thank you  [facility]  [phone] All Hospital Liaisons are now on [place]

## 2017-04-20 NOTE — Progress Notes (Signed)
Home Care SW and RN made visit to Patient.  Patient was in bed, awake, but very confused.  SW met with Daughter and Son and discussed Optometrist and completed the financial assessment.  SW offered support to Patient and family and will follow up for transition visit once Patient gets to [facility].  Cecilio Asper, [place] [facility] [place]

## 2017-04-20 NOTE — Progress Notes (Signed)
Daniel [name] 89yoM arrived in 1525 now. DNR - Pneumonia and sepsis has a temp 101.1. bld cx or abx ordered.  Thanks.

## 2017-04-20 NOTE — Discharge Summary (Signed)
Physician Discharge Summary  [name]:[id number] DOB: 11/08/1920 DOA: 04/19/2017  PCP: Bernerd Limbo, MD  Admit date: 04/19/2017 Discharge date: 04/20/2017  Admitted From: Home Discharge disposition: [facility]   Recommendations for Outpatient Follow-Up:   1. The patient is being discharged to a residential hospice facility.   Discharge Diagnosis:   Principal Problem:   CAP (community acquired pneumonia) Active Problems:   Alzheimer's disease   GERD (gastroesophageal reflux disease)   HTN (hypertension)   Cardiac pacemaker implanted- St Jude - 01/03/14   Sepsis ([place])   CKD (chronic kidney disease), stage III   Fall   Community acquired pneumonia    Discharge Condition: Improved.  Diet recommendation: Comfort feeds.  Wound care: None.   History of Present Illness:   Daniel [name] is an 83 y.o. male with a PMH of dementia, hypertension, hyperlipidemia, GERD, depression/anxiety, seizure disorder, complete heart block status post pacemaker, chronic diastolic CHF, stage III CK D who was admitted 04/19/17 with upper respiratory symptoms and a fall. Patient is under the care of hospice at baseline. Chest x-ray findings concerning for the development of possible pneumonia.   Hospital Course by Problem:   Principal Problem:   Sepsis secondary to CAP (community acquired pneumonia) Family has elected for comfort measures only. Palliative care consultation requested for disposition. Family would like the patient to go to a residential hospice facility.  Active Problems:   Alzheimer's disease Stop Namenda/Aricept.    GERD (gastroesophageal reflux disease) Continue PPI if able to swallow.    HTN (hypertension) No need to treat given EOL care.    Cardiac pacemaker implanted- St Jude - 01/03/14 Will need to consider de-activation at [facility].    CKD (chronic kidney disease), stage III No further monitoring.    Fall Comfort care.   Medical  Consultants:    None.   Discharge Exam:   Vitals:   04/20/17 0323 04/20/17 1204  BP: (!) 148/48 (!) 115/56  Pulse: 66 60  Resp: 18 16  Temp: (!) 101.1 F (38.4 C) (!) 97.5 F (36.4 C)   Vitals:   04/20/17 0224 04/20/17 0250 04/20/17 0323 04/20/17 1204  BP: (!) 126/98 (!) 114/51 (!) 148/48 (!) 115/56  Pulse: 66 68 66 60  Resp: 18 16 18 16   Temp:  99.1 F (37.3 C) (!) 101.1 F (38.4 C) (!) 97.5 F (36.4 C)  TempSrc:  Oral Oral Axillary  SpO2: 96% 96% 98% 94%  Weight:   67.5 kg (148 lb 13 oz)     General exam: Appears calm and comfortable. Sleepy. Respiratory system: Clear to auscultation but diminished. Respiratory effort normal. Hoarse vocalizations. Cardiovascular system: S1 & S2 heard, RRR. No JVD,  rubs, gallops or clicks. No murmurs. Gastrointestinal system: Abdomen is nondistended, soft and nontender. No organomegaly or masses felt. Normal bowel sounds heard. Central nervous system: Sleepy. No focal neurological deficits. +Delirium. Extremities: No clubbing,  or cyanosis. No edema. Skin: No rashes, lesions or ulcers. Psychiatry: Judgement and insight appear impaired. Mood & affect flat.   The results of significant diagnostics from this hospitalization (including imaging, microbiology, ancillary and laboratory) are listed below for reference.     Procedures and Diagnostic Studies:   Dg Chest 2 View  Result Date: 04/19/2017 CLINICAL DATA:  84 year old male with cough. EXAM: CHEST  2 VIEW COMPARISON:  Chest CT dated 12/25/2016 FINDINGS: Bibasilar streaky densities, likely atelectatic changes. Developing infiltrate is less likely. Clinical correlation is recommended. There is no focal consolidation, pleural  effusion, or pneumothorax. The cardiac silhouette is within normal limits. Left pectoral pacemaker device. No acute osseous pathology. IMPRESSION: Bilateral mid to lower lung field streaky densities may represent atelectatic changes. Developing infiltrate or  atypical pneumonia is not excluded. Clinical correlation is recommended. No focal consolidation. Electronically Signed   By: Anner Crete M.D.   On: 04/19/2017 22:21   Dg Knee Complete 4 Views Right  Result Date: 04/19/2017 CLINICAL DATA:  83 year old male with fall and right knee pain. EXAM: RIGHT KNEE - COMPLETE 4+ VIEW COMPARISON:  Radiograph dated 02/11/2016 FINDINGS: There is no acute fracture or dislocation. Mild osteopenia. No significant arthritic changes. No joint effusion. The soft tissues appear unremarkable. Vascular calcifications noted. IMPRESSION: No acute findings. Electronically Signed   By: Anner Crete M.D.   On: 04/19/2017 23:13   Dg Hip Unilat W Or W/o Pelvis Min 4 Views Left  Result Date: 04/19/2017 CLINICAL DATA:  83 year old male with fall and left hip pain. EXAM: DG HIP (WITH OR WITHOUT PELVIS) 4+V LEFT COMPARISON:  None. FINDINGS: There is no acute fracture or dislocation. There is mild osteopenia. Mild arthritic changes of the hips. There is degenerative changes of the lower lumbar spine. The soft tissues appear unremarkable. IMPRESSION: No acute fracture or dislocation. Electronically Signed   By: Anner Crete M.D.   On: 04/19/2017 23:08     Labs:   Basic Metabolic Panel:  Recent Labs Lab 04/19/17 2319  NA 138  K 4.0  CL 104  CO2 27  GLUCOSE 157*  BUN 21*  CREATININE 1.27*  CALCIUM 8.6*   GFR Estimated Creatinine Clearance: 34.6 mL/min (A) (by C-G formula based on SCr of 1.27 mg/dL (H)). Liver Function Tests: No results for input(s): AST, ALT, ALKPHOS, BILITOT, PROT, ALBUMIN in the last 168 hours. No results for input(s): LIPASE, AMYLASE in the last 168 hours. No results for input(s): AMMONIA in the last 168 hours. Coagulation profile No results for input(s): INR, PROTIME in the last 168 hours.  CBC:  Recent Labs Lab 04/19/17 2319  WBC 14.4*  NEUTROABS 11.4*  HGB 12.5*  HCT 36.8*  MCV 92.7  PLT 182   Cardiac Enzymes: No results  for input(s): CKTOTAL, CKMB, CKMBINDEX, TROPONINI in the last 168 hours. BNP: Invalid input(s): POCBNP CBG: No results for input(s): GLUCAP in the last 168 hours. D-Dimer No results for input(s): DDIMER in the last 72 hours. Hgb A1c No results for input(s): HGBA1C in the last 72 hours. Lipid Profile No results for input(s): CHOL, HDL, LDLCALC, TRIG, CHOLHDL, LDLDIRECT in the last 72 hours. Thyroid function studies No results for input(s): TSH, T4TOTAL, T3FREE, THYROIDAB in the last 72 hours.  Invalid input(s): FREET3 Anemia work up No results for input(s): VITAMINB12, FOLATE, FERRITIN, TIBC, IRON, RETICCTPCT in the last 72 hours. Microbiology No results found for this or any previous visit (from the past 240 hour(s)).   Discharge Instructions:   Discharge Instructions    Diet - low sodium heart healthy    Complete by:  As directed    Increase activity slowly    Complete by:  As directed    Increase activity slowly    Complete by:  As directed      Allergies as of 04/20/2017      Reactions   Sanctura [trospium] Other (See Comments)   Reaction:  Seizures    Biaxin [clarithromycin] Hives      Medication List    STOP taking these medications   donepezil 5 MG tablet  Commonly known as:  ARICEPT   memantine 10 MG tablet Commonly known as:  NAMENDA   MYRBETRIQ 50 MG Tb24 tablet Generic drug:  mirabegron ER     TAKE these medications   acetaminophen 650 MG suppository Commonly known as:  TYLENOL Place 1 suppository (650 mg total) rectally every 4 (four) hours as needed for fever.   LORazepam 2 MG/ML injection Commonly known as:  ATIVAN Inject 0.25 mLs (0.5 mg total) into the vein every 4 (four) hours as needed for anxiety.   morphine 2 MG/ML injection Inject 0.5 mLs (1 mg total) into the vein every 4 (four) hours as needed (or respiratory distress/tachypnea).   omeprazole 20 MG capsule Commonly known as:  PRILOSEC Take 1 capsule (20 mg total) by mouth daily.  Take in morning on empty stomach. What changed:  when to take this  additional instructions   risperiDONE 0.25 MG tablet Commonly known as:  RISPERDAL Take 0.25 mg by mouth at bedtime.   sertraline 100 MG tablet Commonly known as:  ZOLOFT Take 1.5 tablets (150 mg total) by mouth daily. What changed:  when to take this         Time coordinating discharge: 40 minutes.  Signed:  RAMA,CHRISTINA  Pager [phone] [facility] 04/20/2017, 1:16 PM

## 2017-04-20 NOTE — Progress Notes (Signed)
Report called to Karlyn Agee at [facility]. EMS currently at bedside to transport patient. Family at bedside (daughter and Son)

## 2017-04-20 NOTE — H&P (Signed)
History and Physical    Daniel [name] LEX:[id number] DOB: [birth year] DOA: 04/19/2017  Referring MD/NP/PA:   PCP: Bernerd Limbo, MD   Patient coming from:  The patient is coming from home.  At baseline, pt is dependent for most of ADL.   Chief Complaint: cough, SOB, sore throat and fall  HPI: Daniel [name] is a 83 y.o. male with medical history significant of dementia, hypertension, hyperlipidemia, GERD, depression, anxiety, seizure, OCD, left bundle blockage, complete heart block, s/p of pacemaker placement, dCHF, CKD-3, diverticulitis, who presents with cough, SOB, sore throat and fall.  Patient has dementia and is unable to provide medical history, therefore, most of the history is obtained by discussing the case with ED physician, per EMS report, and with the nursing staff. I also called her daughter by phone. Per his daughter, pt has been having cough, SOB and sore throat in the past several days. He coughs up brownish colored sputum. Patient does not seem to have chest pain per her daughter. Pt fell today, not sure if he injured his head or not. Pt has small abrasion to left shoulder. Patient is a Hospice patient. Pt was evaluated by Hospice RN after fall and was thought no need for transport. Per his daughter, patient does not have  nausea, vomiting, diarrhea. He moves all extremities. Sometimes the patient has abdominal pain per his daughter.  ED Course: pt was found to have WBC 14.4, stable renal function, temperature 99.8, oxygen saturation 90-100% on room air, x-ray of her right knee is negative for bony fracture. Chest x-ray showed bilateral mid to lower lung field streaky densities, may represent developing infiltrate or atypical pneumonia. Pt is admitted to med-surg bed as inpt.   Review of Systems: Could not be reviewed due to dementia.  Allergy:  Allergies  Allergen Reactions  . [place] [Trospium] Other (See Comments)    Reaction:  Seizures   . Biaxin [Clarithromycin] Hives     Past Medical History:  Diagnosis Date  . Allergy   . Anxiety   . Cataract   . Degenerative arthritis   . Diverticulitis   . Diverticulitis   . Dyslipidemia   . Dysphagia    Mild  . Gastroesophageal reflux disease   . Hearing difficulty    bilateral hearing aids  . Hearing loss   . Hypertension   . Left bundle branch block   . Memory loss   . Nocturnal leg cramps   . OCD (obsessive compulsive disorder)   . Seizures ([place])   . Substance abuse   . Ulcer     Past Surgical History:  Procedure Laterality Date  . BASAL CELL CARCINOMA EXCISION    . CATARACT EXTRACTION Bilateral   . EYE SURGERY    . PERMANENT PACEMAKER INSERTION N/A 01/03/2014   Procedure: PERMANENT PACEMAKER INSERTION;  Surgeon: Sanda Klein, MD;  Location: [facility];  Service: Cardiovascular;  Laterality: N/A;  . PROSTATE SURGERY    . TONSILLECTOMY    . TURP VAPORIZATION      Social History:  reports that he has quit smoking. He has never used smokeless tobacco. He reports that he does not drink alcohol or use drugs.  Family History:  Family History  Problem Relation Age of Onset  . Heart attack Mother   . Dementia Father   . Heart attack Sister   . Heart disease Sister   . Heart attack Brother   . Heart attack Sister   . Heart attack Brother   .  Stroke Brother   . Heart disease Daughter   . Heart disease Paternal Grandmother      Prior to Admission medications   Medication Sig Start Date End Date Taking? Authorizing Provider  aspirin 81 MG tablet Take 81 mg by mouth daily.    [provider]  cholecalciferol (VITAMIN D) 1000 UNITS tablet Take 2,000 Units by mouth daily.     [provider]  donepezil (ARICEPT) 5 MG tablet TAKE 1 TABLET (5 MG TOTAL) BY MOUTH 2 (TWO) TIMES DAILY. 08/24/16   Kathrynn Ducking, MD  doxycycline (VIBRAMYCIN) 100 MG capsule Take 1 capsule (100 mg total) by mouth 2 (two) times daily. 12/25/16   Quintella Reichert, MD  fluticasone [facility]) 50  MCG/ACT nasal spray Place 2 sprays into both nostrils daily. 02/17/16   Leandrew Koyanagi, MD  memantine (NAMENDA) 10 MG tablet TAKE 1 TABLET BY MOUTH TWICE DAILY 01/11/17   Kathrynn Ducking, MD  mirabegron ER (MYRBETRIQ) 50 MG TB24 tablet Take 50 mg by mouth daily.    [provider]  Multiple Vitamins-Minerals (PRESERVISION AREDS) TABS Take 1 tablet by mouth daily.    [provider]  olopatadine (PATANOL) 0.1 % ophthalmic solution PLACE 1 DROP INTO BOTH EYES 2 (TWO) TIMES DAILY. 09/17/15   Leandrew Koyanagi, MD  omeprazole (PRILOSEC) 20 MG capsule Take 1 capsule (20 mg total) by mouth daily. Take in morning on empty stomach. 10/08/15   Leandrew Koyanagi, MD  sertraline (ZOLOFT) 100 MG tablet Take 1.5 tablets (150 mg total) by mouth daily. 10/20/16   Kathrynn Ducking, MD  triamcinolone cream (KENALOG) 0.1 % Apply 1 application topically as needed. Apply to affected area x 14 days 09/14/16   [provider]    Physical Exam: Vitals:   04/20/17 0215 04/20/17 0224 04/20/17 0250 04/20/17 0323  BP: (!) 126/98 (!) 126/98 (!) 114/51 (!) 148/48  Pulse:  66 68 66  Resp:  18 16 18   Temp:   99.1 F (37.3 C) (!) 101.1 F (38.4 C)  TempSrc:   Oral Oral  SpO2:  96% 96% 98%  Weight:    67.5 kg (148 lb 13 oz)   General: Not in acute distress. Dry mucus and membrane HEENT:       Eyes: PERRL, EOMI, no scleral icterus.       ENT: No discharge from the ears and nose, no pharynx injection, no tonsillar enlargement.        Neck: No JVD, no bruit, no mass felt. Heme: No neck lymph node enlargement. Cardiac: S1/S2, RRR, No murmurs, No gallops or rubs. Respiratory: No rales, wheezing, rhonchi or rubs. GI: Soft, nondistended, nontender, no organomegaly, BS present. GU: No hematuria Ext: No pitting leg edema bilaterally. 2+DP/PT pulse bilaterally. Musculoskeletal: No joint deformities, No joint redness or warmth, no limitation of ROM in spin. Skin: No rashes.  Neuro: not  oriented X3, cranial nerves II-XII grossly intact, moves all extremities. Psych: Patient is not psychotic, no suicidal or hemocidal ideation.  Labs on Admission: I have personally reviewed following labs and imaging studies  CBC:  Recent Labs Lab 04/19/17 2319  WBC 14.4*  NEUTROABS 11.4*  HGB 12.5*  HCT 36.8*  MCV 92.7  PLT 704   Basic Metabolic Panel:  Recent Labs Lab 04/19/17 2319  NA 138  K 4.0  CL 104  CO2 27  GLUCOSE 157*  BUN 21*  CREATININE 1.27*  CALCIUM 8.6*   GFR: Estimated Creatinine Clearance: 34.6 mL/min (A) (  by C-G formula based on SCr of 1.27 mg/dL (H)). Liver Function Tests: No results for input(s): AST, ALT, ALKPHOS, BILITOT, PROT, ALBUMIN in the last 168 hours. No results for input(s): LIPASE, AMYLASE in the last 168 hours. No results for input(s): AMMONIA in the last 168 hours. Coagulation Profile: No results for input(s): INR, PROTIME in the last 168 hours. Cardiac Enzymes: No results for input(s): CKTOTAL, CKMB, CKMBINDEX, TROPONINI in the last 168 hours. BNP (last 3 results) No results for input(s): PROBNP in the last 8760 hours. HbA1C: No results for input(s): HGBA1C in the last 72 hours. CBG: No results for input(s): GLUCAP in the last 168 hours. Lipid Profile: No results for input(s): CHOL, HDL, LDLCALC, TRIG, CHOLHDL, LDLDIRECT in the last 72 hours. Thyroid Function Tests: No results for input(s): TSH, T4TOTAL, FREET4, T3FREE, THYROIDAB in the last 72 hours. Anemia Panel: No results for input(s): VITAMINB12, FOLATE, FERRITIN, TIBC, IRON, RETICCTPCT in the last 72 hours. Urine analysis:    Component Value Date/Time   COLORURINE YELLOW 12/25/2016 1415   APPEARANCEUR CLEAR 12/25/2016 1415   APPEARANCEUR Clear 08/19/2015 1559   LABSPEC 1.021 12/25/2016 1415   PHURINE 6.0 12/25/2016 1415   GLUCOSEU NEGATIVE 12/25/2016 1415   HGBUR NEGATIVE 12/25/2016 1415   BILIRUBINUR NEGATIVE 12/25/2016 1415   BILIRUBINUR Negative 08/19/2015 1559     KETONESUR NEGATIVE 12/25/2016 1415   PROTEINUR NEGATIVE 12/25/2016 1415   UROBILINOGEN 0.2 04/21/2015 1431   NITRITE NEGATIVE 12/25/2016 1415   LEUKOCYTESUR NEGATIVE 12/25/2016 1415   LEUKOCYTESUR Negative 08/19/2015 1559   Sepsis Labs: @LABRCNTIP (procalcitonin:4,lacticidven:4) )No results found for this or any previous visit (from the past 240 hour(s)).   Radiological Exams on Admission: Dg Chest 2 View  Result Date: 04/19/2017 CLINICAL DATA:  83 year old male with cough. EXAM: CHEST  2 VIEW COMPARISON:  Chest CT dated 12/25/2016 FINDINGS: Bibasilar streaky densities, likely atelectatic changes. Developing infiltrate is less likely. Clinical correlation is recommended. There is no focal consolidation, pleural effusion, or pneumothorax. The cardiac silhouette is within normal limits. Left pectoral pacemaker device. No acute osseous pathology. IMPRESSION: Bilateral mid to lower lung field streaky densities may represent atelectatic changes. Developing infiltrate or atypical pneumonia is not excluded. Clinical correlation is recommended. No focal consolidation. Electronically Signed   By: Anner Crete M.D.   On: 04/19/2017 22:21   Dg Knee Complete 4 Views Right  Result Date: 04/19/2017 CLINICAL DATA:  83 year old male with fall and right knee pain. EXAM: RIGHT KNEE - COMPLETE 4+ VIEW COMPARISON:  Radiograph dated 02/11/2016 FINDINGS: There is no acute fracture or dislocation. Mild osteopenia. No significant arthritic changes. No joint effusion. The soft tissues appear unremarkable. Vascular calcifications noted. IMPRESSION: No acute findings. Electronically Signed   By: Anner Crete M.D.   On: 04/19/2017 23:13   Dg Hip Unilat W Or W/o Pelvis Min 4 Views Left  Result Date: 04/19/2017 CLINICAL DATA:  83 year old male with fall and left hip pain. EXAM: DG HIP (WITH OR WITHOUT PELVIS) 4+V LEFT COMPARISON:  None. FINDINGS: There is no acute fracture or dislocation. There is mild osteopenia.  Mild arthritic changes of the hips. There is degenerative changes of the lower lumbar spine. The soft tissues appear unremarkable. IMPRESSION: No acute fracture or dislocation. Electronically Signed   By: Anner Crete M.D.   On: 04/19/2017 23:08     EKG: Not done in ED.   Assessment/Plan Principal Problem:   CAP (community acquired pneumonia) Active Problems:   Alzheimer's disease   GERD (gastroesophageal reflux disease)  HTN (hypertension)   Cardiac pacemaker implanted- St Jude - 01/03/14   Sepsis ([place])   CKD (chronic kidney disease), stage III   Fall   CAP and sepsis: Patient has productive cough and shortness rest. Chest x-ray showed possible pneumonia. Patient meets criteria for sepsis with fever and a leukocytosis. Lactic acid was not tested. Currently hemodynamically stable. EDP had lengthy conversation with the daughter who is the healthcare POA. Per EDP's note, " She does not want antibiotics given, and states that they are ready to "let him go," but expresses concern about this happening in her home.  She would like for the patient to be able to go to an inpatient facility such as [facility].  I discussed the patient with Tiffany, RN, from Palliative Care, who will have the morning Palliative Care team come to see the patient and family member.  At this time, per the healthcare POA, the plan is for observation for comfort care, NO antibiotics.  Patient is DNR.  The daughter acknowledges that by not treating the pneumonia, this will likely lead to the patient's death.  She states that "he has suffered so much."  She states that they are ready to "let him go."  She and her sister decided back in March that if their father got pneumonia again that they would NOT treat". I also spoke with his daughter by phone and confirmed that they want to toward comfort care. His daughter wants pt be given IVF, but no antibiotics or other treatment, no further lab or image should be done.  -will  admit to med-surg bed -IV NS at 125 cc/h -prn morphine for pain -Patient to be seen by Palliative Care in the morning. -consult to SW  Other active Problems: will hold all home medications now since pt will toward comfort care.   Alzheimer's disease   GERD (gastroesophageal reflux disease)   HTN (hypertension)   Cardiac pacemaker implanted- St Jude - 01/03/14   Sepsis ([place])   CKD (chronic kidney disease), stage III   Fall   DVT ppx: none (toward comfort care) Code Status: DNR Family Communication:  Yes, patient's daughter who is POA on the phone Disposition Plan:  Anticipate discharge back to previous home environment Consults called:  none Admission status: medical floor/inpt   Date of Service 04/20/2017    Ivor Costa [facility] Pager [phone]  If 7PM-7AM, please contact night-coverage [email or web address] Password [facility] 04/20/2017, 4:32 AM

## 2017-05-11 DIAGNOSIS — I10 Essential (primary) hypertension: Secondary | ICD-10-CM | POA: Diagnosis not present

## 2017-05-11 DIAGNOSIS — N3281 Overactive bladder: Secondary | ICD-10-CM | POA: Diagnosis not present

## 2017-05-11 DIAGNOSIS — J309 Allergic rhinitis, unspecified: Secondary | ICD-10-CM | POA: Diagnosis not present

## 2017-05-11 DIAGNOSIS — I5030 Unspecified diastolic (congestive) heart failure: Secondary | ICD-10-CM | POA: Diagnosis not present

## 2017-05-11 DIAGNOSIS — E785 Hyperlipidemia, unspecified: Secondary | ICD-10-CM | POA: Diagnosis not present

## 2017-05-11 DIAGNOSIS — K219 Gastro-esophageal reflux disease without esophagitis: Secondary | ICD-10-CM | POA: Diagnosis not present

## 2017-05-11 DIAGNOSIS — I25810 Atherosclerosis of coronary artery bypass graft(s) without angina pectoris: Secondary | ICD-10-CM | POA: Diagnosis not present

## 2017-05-11 DIAGNOSIS — N401 Enlarged prostate with lower urinary tract symptoms: Secondary | ICD-10-CM | POA: Diagnosis not present

## 2017-05-11 DIAGNOSIS — F411 Generalized anxiety disorder: Secondary | ICD-10-CM | POA: Diagnosis not present

## 2017-05-11 DIAGNOSIS — R54 Age-related physical debility: Secondary | ICD-10-CM | POA: Diagnosis not present

## 2017-05-11 DIAGNOSIS — I672 Cerebral atherosclerosis: Secondary | ICD-10-CM | POA: Diagnosis not present

## 2017-05-11 DIAGNOSIS — R1310 Dysphagia, unspecified: Secondary | ICD-10-CM | POA: Diagnosis not present

## 2017-05-11 DIAGNOSIS — I495 Sick sinus syndrome: Secondary | ICD-10-CM | POA: Diagnosis not present

## 2017-05-11 DIAGNOSIS — F339 Major depressive disorder, recurrent, unspecified: Secondary | ICD-10-CM | POA: Diagnosis not present

## 2017-05-11 DIAGNOSIS — N183 Chronic kidney disease, stage 3 (moderate): Secondary | ICD-10-CM | POA: Diagnosis not present

## 2017-05-11 DIAGNOSIS — F0150 Vascular dementia without behavioral disturbance: Secondary | ICD-10-CM | POA: Diagnosis not present

## 2017-05-12 DIAGNOSIS — F0150 Vascular dementia without behavioral disturbance: Secondary | ICD-10-CM | POA: Diagnosis not present

## 2017-05-12 DIAGNOSIS — I495 Sick sinus syndrome: Secondary | ICD-10-CM | POA: Diagnosis not present

## 2017-05-12 DIAGNOSIS — N183 Chronic kidney disease, stage 3 (moderate): Secondary | ICD-10-CM | POA: Diagnosis not present

## 2017-05-12 DIAGNOSIS — I5030 Unspecified diastolic (congestive) heart failure: Secondary | ICD-10-CM | POA: Diagnosis not present

## 2017-05-12 DIAGNOSIS — I672 Cerebral atherosclerosis: Secondary | ICD-10-CM | POA: Diagnosis not present

## 2017-05-12 DIAGNOSIS — I25810 Atherosclerosis of coronary artery bypass graft(s) without angina pectoris: Secondary | ICD-10-CM | POA: Diagnosis not present

## 2017-05-13 ENCOUNTER — Non-Acute Institutional Stay (INDEPENDENT_AMBULATORY_CARE_PROVIDER_SITE_OTHER): Payer: Medicare Other | Admitting: Internal Medicine

## 2017-05-13 DIAGNOSIS — F0150 Vascular dementia without behavioral disturbance: Secondary | ICD-10-CM | POA: Diagnosis not present

## 2017-05-13 DIAGNOSIS — F419 Anxiety disorder, unspecified: Secondary | ICD-10-CM | POA: Diagnosis not present

## 2017-05-13 DIAGNOSIS — I5030 Unspecified diastolic (congestive) heart failure: Secondary | ICD-10-CM | POA: Diagnosis not present

## 2017-05-13 DIAGNOSIS — N183 Chronic kidney disease, stage 3 (moderate): Secondary | ICD-10-CM | POA: Diagnosis not present

## 2017-05-13 DIAGNOSIS — Z0389 Encounter for observation for other suspected diseases and conditions ruled out: Secondary | ICD-10-CM

## 2017-05-13 DIAGNOSIS — I495 Sick sinus syndrome: Secondary | ICD-10-CM | POA: Diagnosis not present

## 2017-05-13 DIAGNOSIS — I25810 Atherosclerosis of coronary artery bypass graft(s) without angina pectoris: Secondary | ICD-10-CM | POA: Diagnosis not present

## 2017-05-13 DIAGNOSIS — F489 Nonpsychotic mental disorder, unspecified: Secondary | ICD-10-CM

## 2017-05-13 DIAGNOSIS — I672 Cerebral atherosclerosis: Secondary | ICD-10-CM | POA: Diagnosis not present

## 2017-05-13 NOTE — Progress Notes (Signed)
Opened in error

## 2017-05-15 DIAGNOSIS — I672 Cerebral atherosclerosis: Secondary | ICD-10-CM | POA: Diagnosis not present

## 2017-05-15 DIAGNOSIS — B370 Candidal stomatitis: Secondary | ICD-10-CM | POA: Diagnosis not present

## 2017-05-16 DIAGNOSIS — I25810 Atherosclerosis of coronary artery bypass graft(s) without angina pectoris: Secondary | ICD-10-CM | POA: Diagnosis not present

## 2017-05-16 DIAGNOSIS — N183 Chronic kidney disease, stage 3 (moderate): Secondary | ICD-10-CM | POA: Diagnosis not present

## 2017-05-16 DIAGNOSIS — I5030 Unspecified diastolic (congestive) heart failure: Secondary | ICD-10-CM | POA: Diagnosis not present

## 2017-05-16 DIAGNOSIS — I495 Sick sinus syndrome: Secondary | ICD-10-CM | POA: Diagnosis not present

## 2017-05-16 DIAGNOSIS — I672 Cerebral atherosclerosis: Secondary | ICD-10-CM | POA: Diagnosis not present

## 2017-05-16 DIAGNOSIS — F0150 Vascular dementia without behavioral disturbance: Secondary | ICD-10-CM | POA: Diagnosis not present

## 2017-05-17 DIAGNOSIS — F0150 Vascular dementia without behavioral disturbance: Secondary | ICD-10-CM | POA: Diagnosis not present

## 2017-05-17 DIAGNOSIS — I5030 Unspecified diastolic (congestive) heart failure: Secondary | ICD-10-CM | POA: Diagnosis not present

## 2017-05-17 DIAGNOSIS — I495 Sick sinus syndrome: Secondary | ICD-10-CM | POA: Diagnosis not present

## 2017-05-17 DIAGNOSIS — N183 Chronic kidney disease, stage 3 (moderate): Secondary | ICD-10-CM | POA: Diagnosis not present

## 2017-05-17 DIAGNOSIS — I672 Cerebral atherosclerosis: Secondary | ICD-10-CM | POA: Diagnosis not present

## 2017-05-17 DIAGNOSIS — I25810 Atherosclerosis of coronary artery bypass graft(s) without angina pectoris: Secondary | ICD-10-CM | POA: Diagnosis not present

## 2017-05-18 DIAGNOSIS — N183 Chronic kidney disease, stage 3 (moderate): Secondary | ICD-10-CM | POA: Diagnosis not present

## 2017-05-18 DIAGNOSIS — I672 Cerebral atherosclerosis: Secondary | ICD-10-CM | POA: Diagnosis not present

## 2017-05-18 DIAGNOSIS — I495 Sick sinus syndrome: Secondary | ICD-10-CM | POA: Diagnosis not present

## 2017-05-18 DIAGNOSIS — F0150 Vascular dementia without behavioral disturbance: Secondary | ICD-10-CM | POA: Diagnosis not present

## 2017-05-18 DIAGNOSIS — I25810 Atherosclerosis of coronary artery bypass graft(s) without angina pectoris: Secondary | ICD-10-CM | POA: Diagnosis not present

## 2017-05-18 DIAGNOSIS — I5030 Unspecified diastolic (congestive) heart failure: Secondary | ICD-10-CM | POA: Diagnosis not present

## 2017-05-20 DIAGNOSIS — F0150 Vascular dementia without behavioral disturbance: Secondary | ICD-10-CM | POA: Diagnosis not present

## 2017-05-20 DIAGNOSIS — I5030 Unspecified diastolic (congestive) heart failure: Secondary | ICD-10-CM | POA: Diagnosis not present

## 2017-05-20 DIAGNOSIS — I25810 Atherosclerosis of coronary artery bypass graft(s) without angina pectoris: Secondary | ICD-10-CM | POA: Diagnosis not present

## 2017-05-20 DIAGNOSIS — I495 Sick sinus syndrome: Secondary | ICD-10-CM | POA: Diagnosis not present

## 2017-05-20 DIAGNOSIS — I672 Cerebral atherosclerosis: Secondary | ICD-10-CM | POA: Diagnosis not present

## 2017-05-20 DIAGNOSIS — N183 Chronic kidney disease, stage 3 (moderate): Secondary | ICD-10-CM | POA: Diagnosis not present

## 2017-05-23 DIAGNOSIS — I25810 Atherosclerosis of coronary artery bypass graft(s) without angina pectoris: Secondary | ICD-10-CM | POA: Diagnosis not present

## 2017-05-23 DIAGNOSIS — I495 Sick sinus syndrome: Secondary | ICD-10-CM | POA: Diagnosis not present

## 2017-05-23 DIAGNOSIS — I672 Cerebral atherosclerosis: Secondary | ICD-10-CM | POA: Diagnosis not present

## 2017-05-23 DIAGNOSIS — F0150 Vascular dementia without behavioral disturbance: Secondary | ICD-10-CM | POA: Diagnosis not present

## 2017-05-23 DIAGNOSIS — N183 Chronic kidney disease, stage 3 (moderate): Secondary | ICD-10-CM | POA: Diagnosis not present

## 2017-05-23 DIAGNOSIS — I5030 Unspecified diastolic (congestive) heart failure: Secondary | ICD-10-CM | POA: Diagnosis not present

## 2017-05-25 DIAGNOSIS — I5030 Unspecified diastolic (congestive) heart failure: Secondary | ICD-10-CM | POA: Diagnosis not present

## 2017-05-25 DIAGNOSIS — F0150 Vascular dementia without behavioral disturbance: Secondary | ICD-10-CM | POA: Diagnosis not present

## 2017-05-25 DIAGNOSIS — I672 Cerebral atherosclerosis: Secondary | ICD-10-CM | POA: Diagnosis not present

## 2017-05-25 DIAGNOSIS — I25810 Atherosclerosis of coronary artery bypass graft(s) without angina pectoris: Secondary | ICD-10-CM | POA: Diagnosis not present

## 2017-05-25 DIAGNOSIS — N183 Chronic kidney disease, stage 3 (moderate): Secondary | ICD-10-CM | POA: Diagnosis not present

## 2017-05-25 DIAGNOSIS — I495 Sick sinus syndrome: Secondary | ICD-10-CM | POA: Diagnosis not present

## 2017-05-30 ENCOUNTER — Telehealth: Payer: Self-pay | Admitting: Cardiovascular Disease

## 2017-05-30 NOTE — Telephone Encounter (Signed)
New message    Pt daughter is calling. Pt passed away on 31-May-2023. She would like to know what to do with his home monitor. Please call.

## 2017-05-30 NOTE — Telephone Encounter (Signed)
Spoke with patient's daughter.  She requests that a return kit be mailed to her attention at [facility], [place], [place].  Advised I will make Dr. Sallyanne Kuster aware of patient's passing.  Patient's daughter is appreciative and denies additional questions or concerns at this time.

## 2017-05-31 ENCOUNTER — Telehealth: Payer: Self-pay | Admitting: Neurology

## 2017-05-31 NOTE — Telephone Encounter (Signed)
Mateo Flow patient's daughter called to advise patient passed away on 2017/06/16.

## 2017-05-31 NOTE — Telephone Encounter (Signed)
FYI, thank you.

## 2017-06-11 DEATH — deceased

## 2017-06-15 ENCOUNTER — Encounter: Admitting: Cardiovascular Disease

## 2017-08-26 IMAGING — CT CT ABD-PELV W/ CM
2 of 5 series · 12 of 36 positions shown, 15 images · IV contrast (iopamidol)
Comparison: CT the abdomen and pelvis 09/26/2016. No prior chest
CT.

CLINICAL DATA: [AGE] male with history of diarrhea this
morning with malodorous urine. History of dementia.

EXAM:
CT CHEST, ABDOMEN, AND PELVIS WITH CONTRAST
TECHNIQUE: Multidetector CT imaging of the chest, abdomen and pelvis was
performed following the standard protocol during bolus
administration of intravenous contrast.
CONTRAST:  80mL MYUXF4-5NN IOPAMIDOL (MYUXF4-5NN) INJECTION 61%

[Series 2: cap with 2 · axial · 0.89mm/px · z∈[-547,-32]mm · 9 of 129 slices shown, 12 images]
[im 13/129  mediastinal]
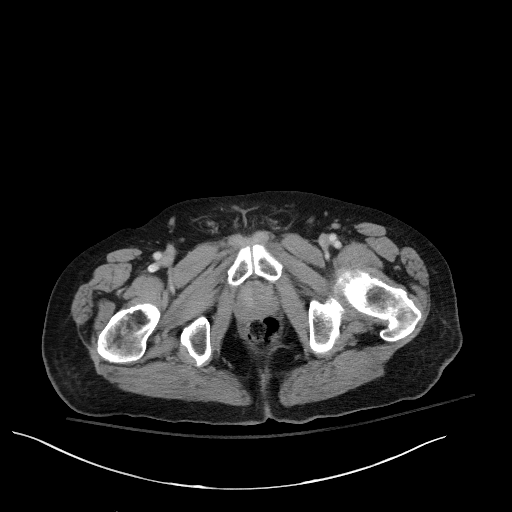
[im 13/129  lung]
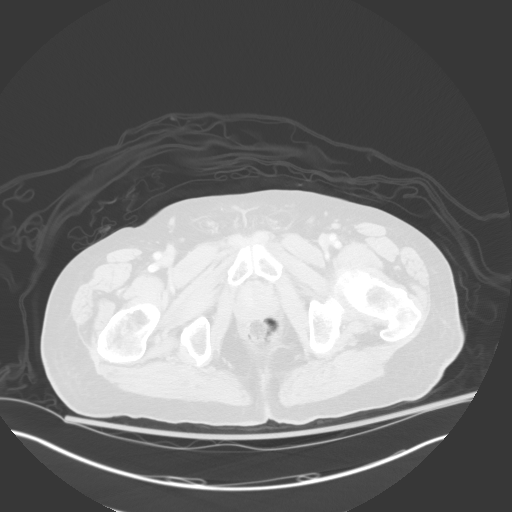
[im 26/129  lung]
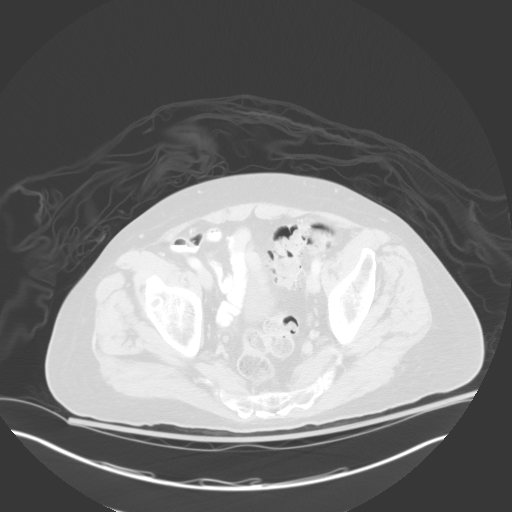
[im 39/129  lung]
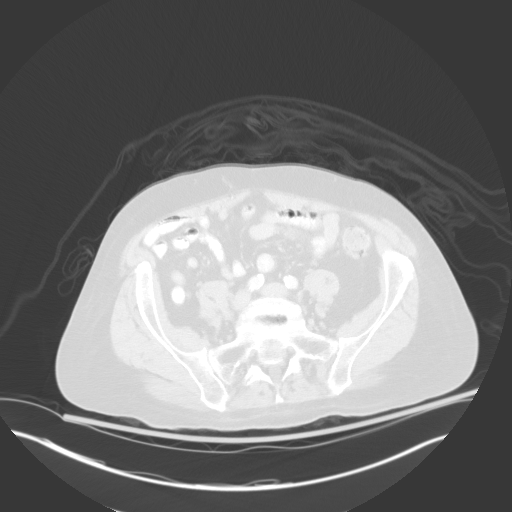
[im 52/129  lung]
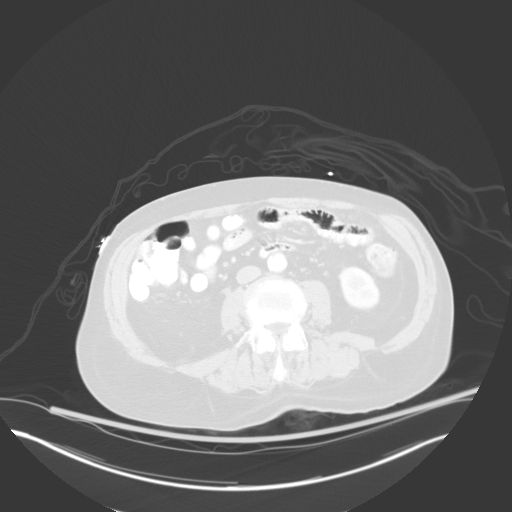
[im 65/129  mediastinal]
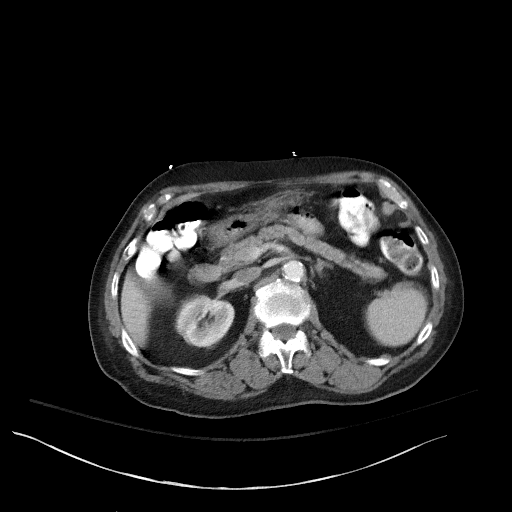
[im 65/129  lung]
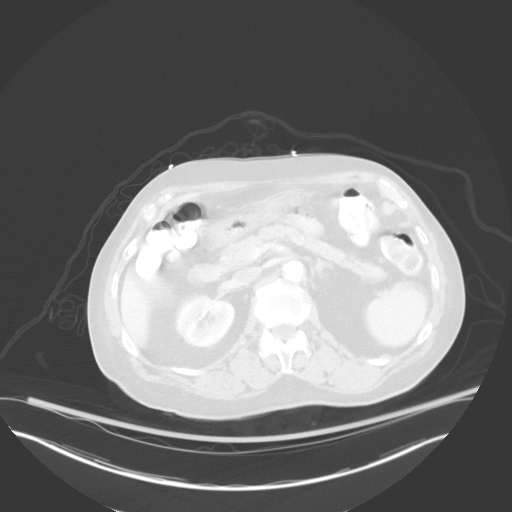
[im 77/129  lung]
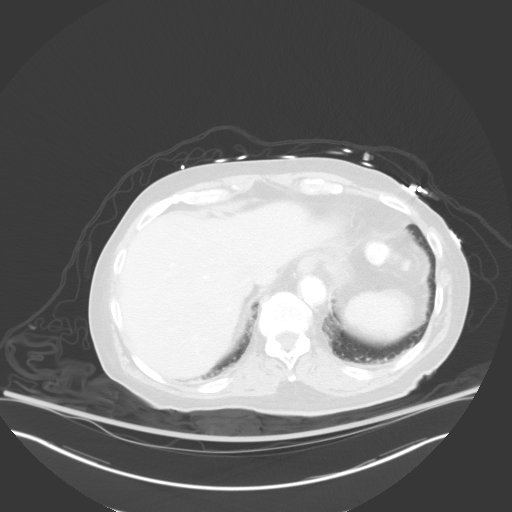
[im 90/129  lung]
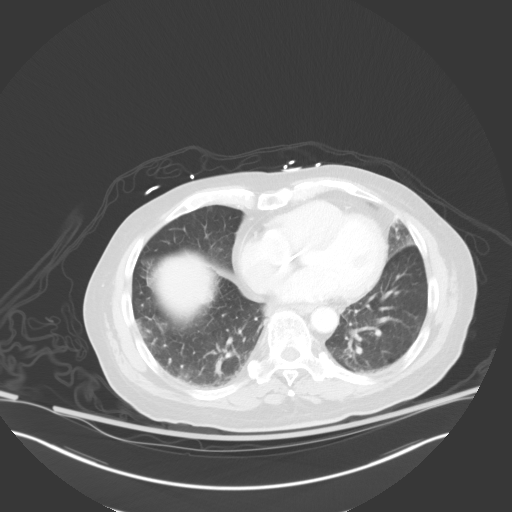
[im 103/129  lung]
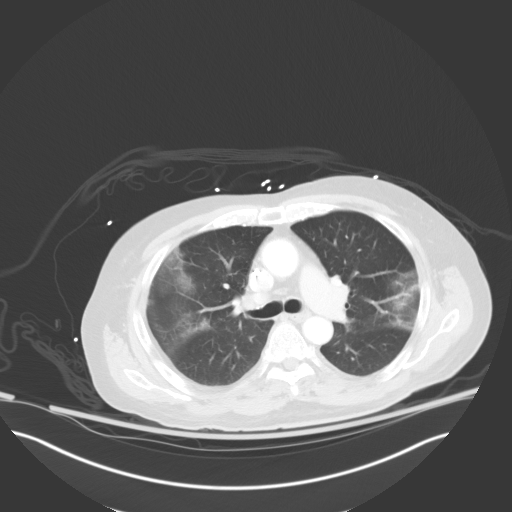
[im 116/129  mediastinal]
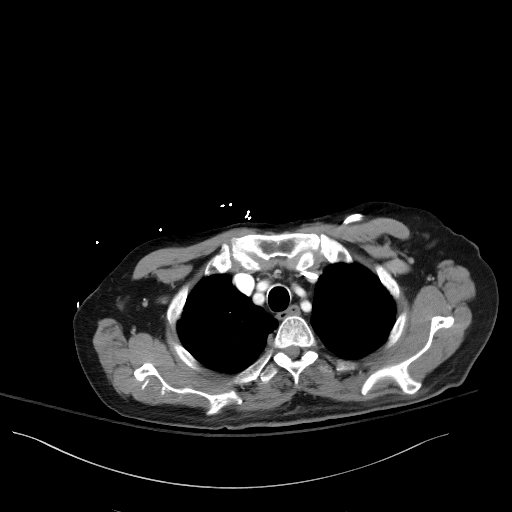
[im 116/129  lung]
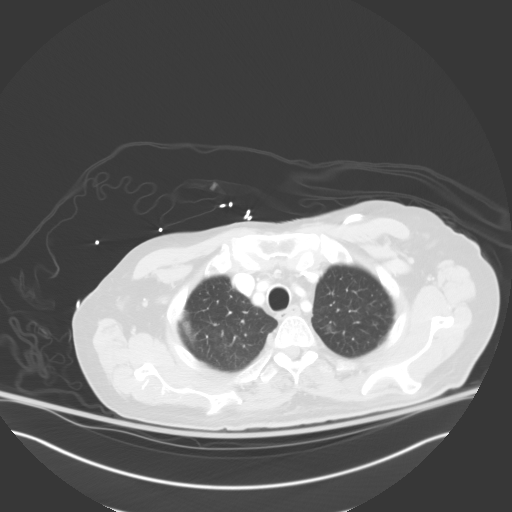

[Series 6: coronals · coronal · 0.92mm/px · 3 of 114 slices shown]
[im 23/114  lung]
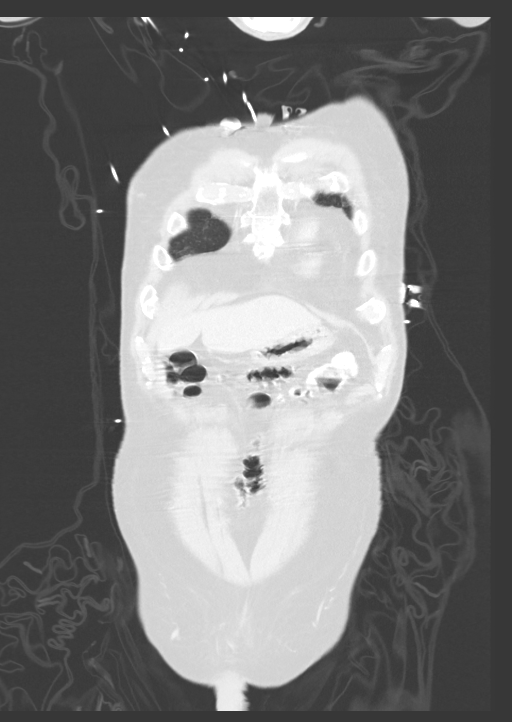
[im 46/114  lung]
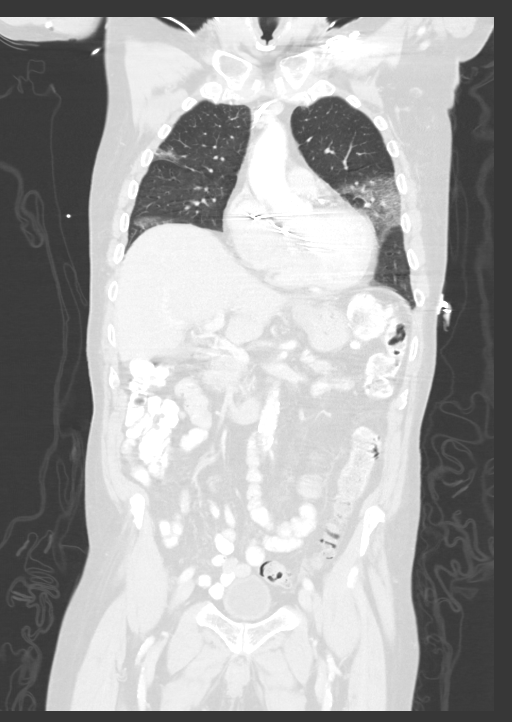
[im 68/114  lung]
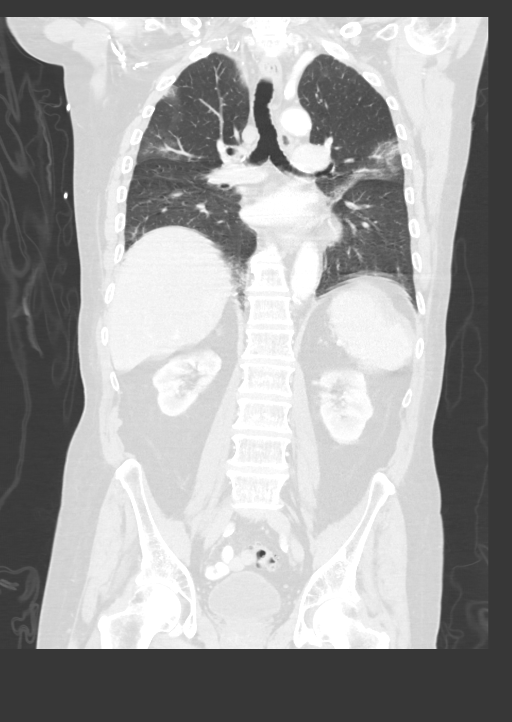

[12 of 36 positions shown; findings below may reference images not displayed]

FINDINGS: CT CHEST FINDINGS

Cardiovascular: Heart size is normal. There is no significant
pericardial fluid, thickening or pericardial calcification. There is
aortic atherosclerosis, as well as atherosclerosis of the great
vessels of the mediastinum and the coronary arteries, including
calcified atherosclerotic plaque in the left main, left anterior
descending and left circumflex coronary arteries. Left-sided
pacemaker device in position with lead tips terminating in the right
atrium and near the right ventricular apex.

Mediastinum/Nodes: No pathologically enlarged mediastinal or hilar
lymph nodes. Esophagus is unremarkable in appearance. No axillary
lymphadenopathy.

Lungs/Pleura: Multifocal patchy areas of ground-glass attenuation
are noted scattered throughout the lungs bilaterally, most confluent
in the periphery of the upper lobes of the lungs bilaterally, where
there is some associated interlobular septal thickening. No pleural
effusions. No definite suspicious appearing pulmonary nodules or
masses.

Musculoskeletal: There are no aggressive appearing lytic or blastic
lesions noted in the visualized portions of the skeleton.

CT ABDOMEN PELVIS FINDINGS

Hepatobiliary: No suspicious cystic or solid hepatic lesions. No
intra or extrahepatic biliary ductal dilatation. Gallbladder is
partially obscured by motion, but unremarkable in appearance.

Pancreas: No pancreatic mass. No pancreatic ductal dilatation. No
pancreatic or peripancreatic fluid or inflammatory changes.

Spleen: Trace volume of perisplenic fluid which appears to be
subcapsular, similar to prior study from 09/26/2016 and more remote
prior study 10/30/2015, likely sequela of remote trauma (i.e., a
chronic unresolved subcapsular hematoma).

Adrenals/Urinary Tract: Bilateral kidneys and bilateral adrenal
glands are normal in appearance. There is no hydroureteronephrosis.
Urinary bladder is unremarkable in appearance.

Stomach/Bowel: No significant volume of ascites. No
pneumoperitoneum. No pathologic dilatation of small bowel or colon.
Numerous colonic diverticulae are noted, particularly in the sigmoid
colon, without surrounding inflammatory changes to suggest an acute
diverticulitis at this time. The appendix is not confidently
identified and may be surgically absent. Regardless, there are no
inflammatory changes noted adjacent to the cecum to suggest the
presence of an acute appendicitis at this time.

Vascular/Lymphatic: Aortic atherosclerosis, without evidence of
aneurysm or dissection in the abdominal or pelvic vasculature. No
lymphadenopathy noted in the abdomen or pelvis.

Reproductive: Postoperative changes of prior TURP are noted.
Prostate gland and seminal vesicles are otherwise unremarkable in
appearance.

Other: No significant volume of ascites.  No pneumoperitoneum.

Musculoskeletal: There are no aggressive appearing lytic or blastic
lesions noted in the visualized portions of the skeleton.
IMPRESSION: 1. Patchy multifocal ground-glass attenuation and septal thickening
in the lungs bilaterally, likely to reflect atypical infection.
Other differential considerations include eosinophilic pneumonia,
acute interstitial pneumonia ([place]), or if the patient has history of
hemoptysis, similar findings could be seen in the setting of
alveolar hemorrhage. Clinical correlation is recommended.
2. No acute findings are noted in the abdomen or pelvis.
3. Aortic atherosclerosis, in addition to left main and 2 vessel
coronary artery disease.
4. Colonic diverticulosis without evidence of acute diverticulitis
at this time.
5. Additional incidental findings, as above.

## 2017-12-19 IMAGING — CR DG CHEST 2V
2 series · 2 of 2 positions shown · non-contrast
Comparison: Chest CT dated 12/25/2016

CLINICAL DATA: [AGE] male with cough.

EXAM:
CHEST  2 VIEW

[w chest lat]
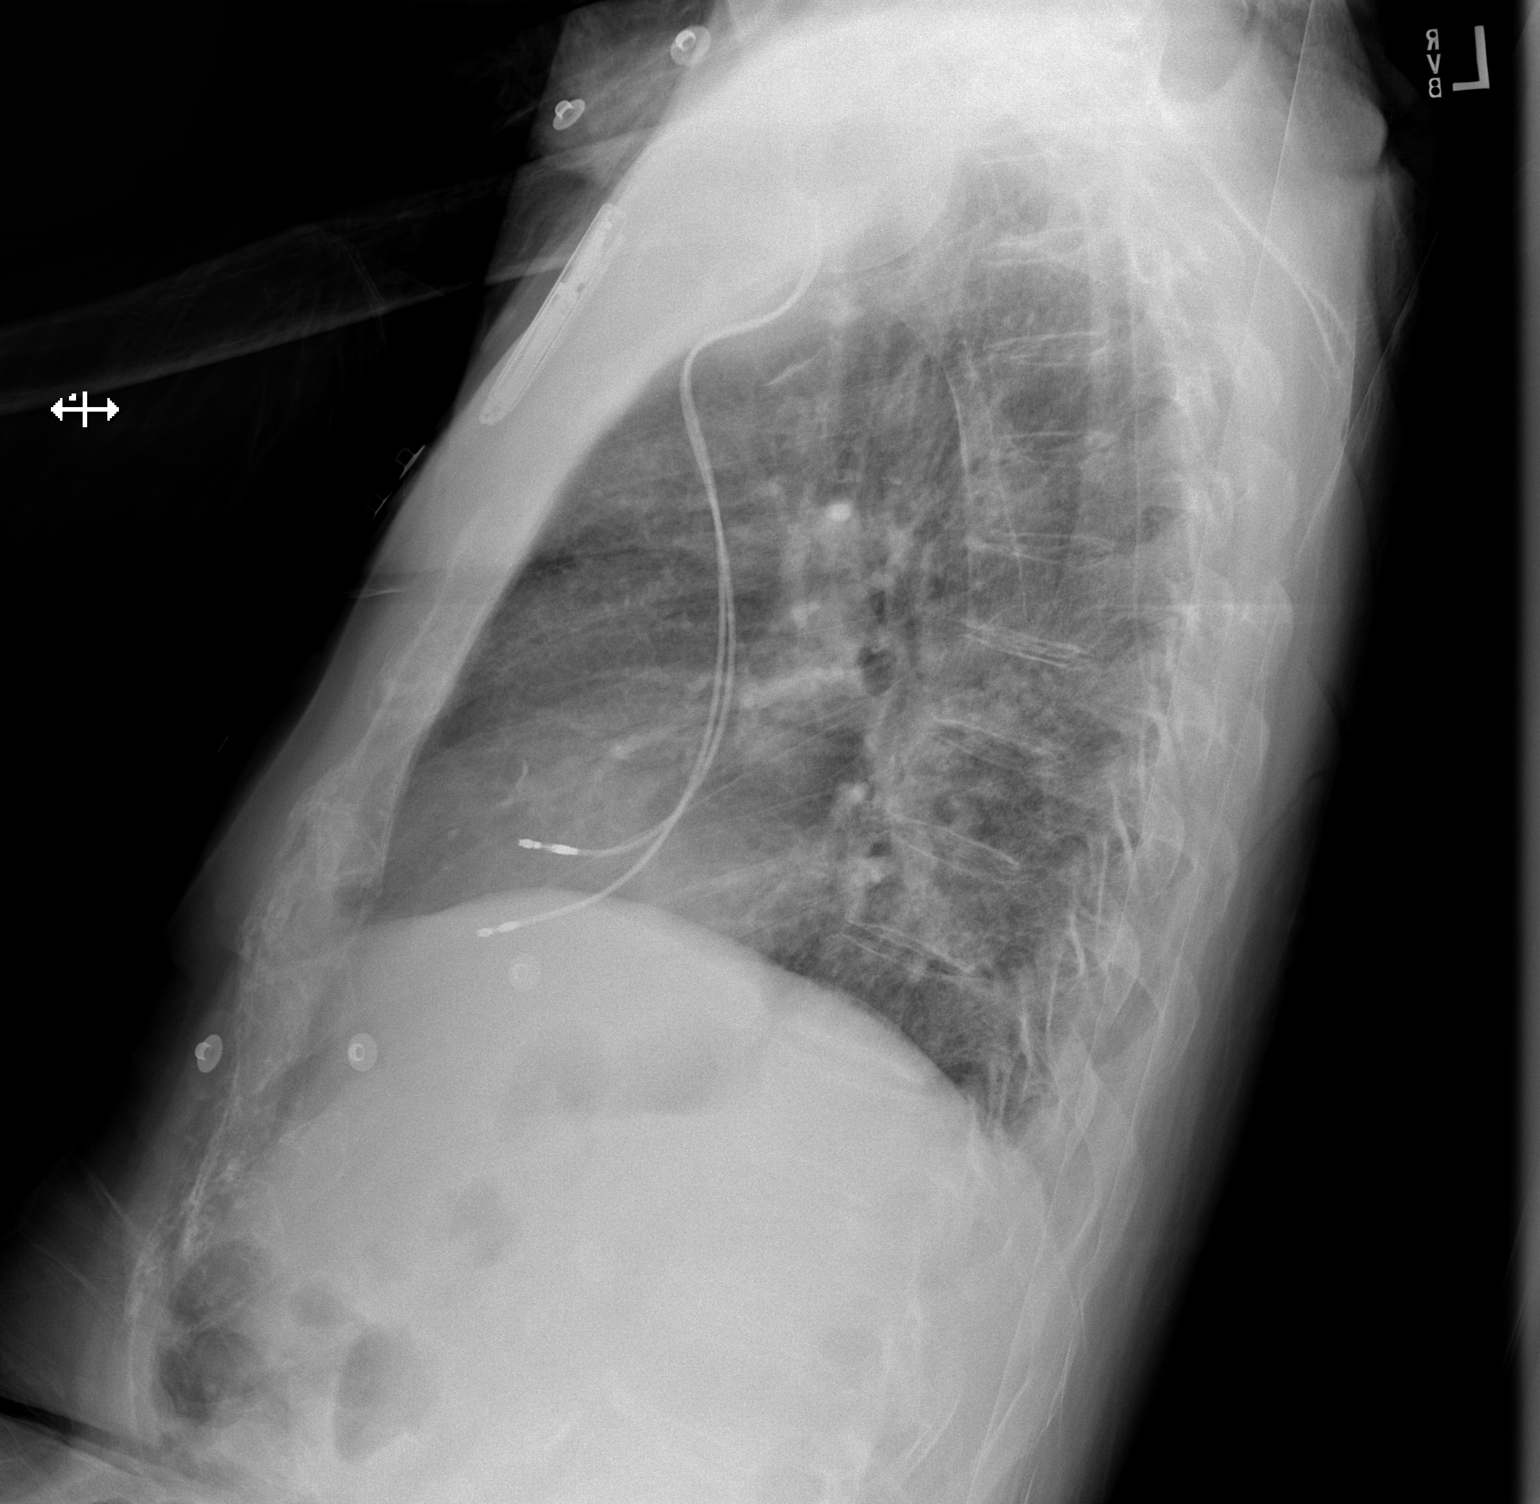

[x chest ap]
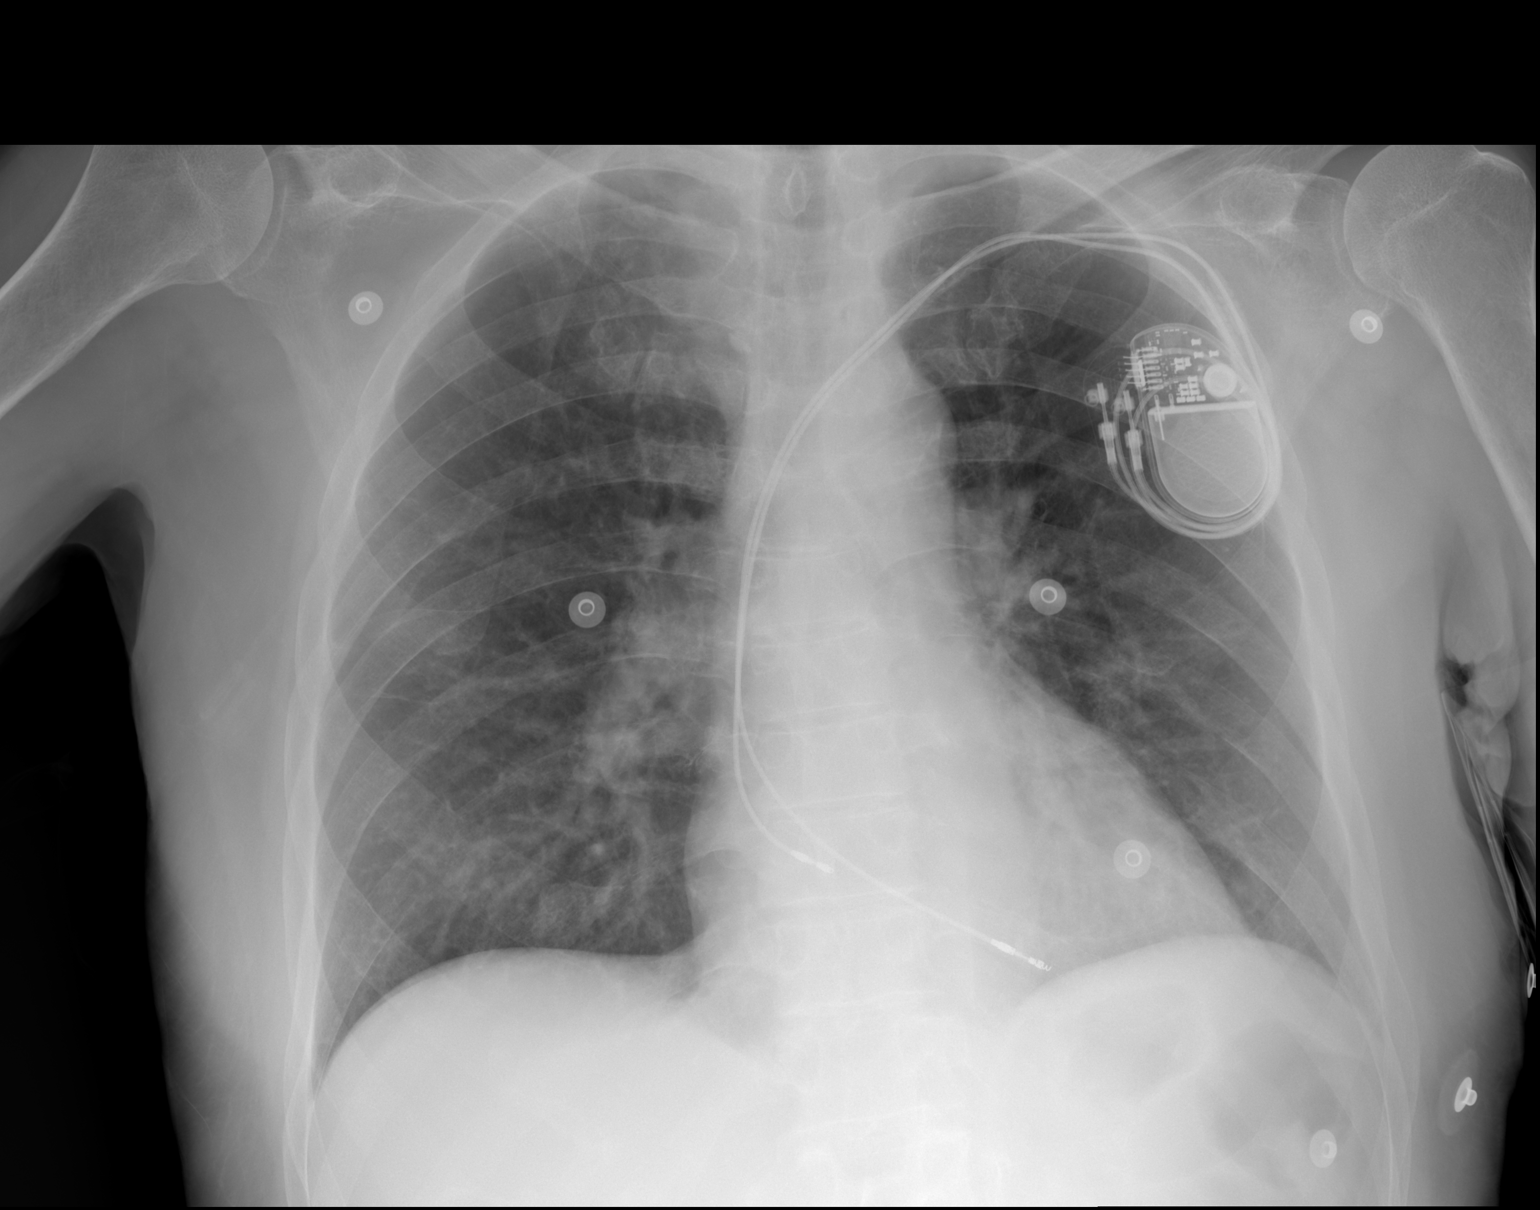

[2 of 2 positions shown; findings below may reference images not displayed]

FINDINGS: Bibasilar streaky densities, likely atelectatic changes. Developing
infiltrate is less likely. Clinical correlation is recommended.
There is no focal consolidation, pleural effusion, or pneumothorax.
The cardiac silhouette is within normal limits. Left pectoral
pacemaker device. No acute osseous pathology.
IMPRESSION: Bilateral mid to lower lung field streaky densities may represent
atelectatic changes. Developing infiltrate or atypical pneumonia is
not excluded. Clinical correlation is recommended. No focal
consolidation.
